# Patient Record
Sex: Female | Born: 1960
Health system: Southern US, Community
[De-identification: ages and names within clinical notes are randomized; demographics above are authoritative.]

## PROBLEM LIST (undated history)

## (undated) ENCOUNTER — Emergency Department (HOSPITAL_COMMUNITY): Admission: EM | Payer: 59 | Source: Home / Self Care

## (undated) DIAGNOSIS — M810 Age-related osteoporosis without current pathological fracture: Secondary | ICD-10-CM

## (undated) DIAGNOSIS — T7840XA Allergy, unspecified, initial encounter: Secondary | ICD-10-CM

## (undated) DIAGNOSIS — J45909 Unspecified asthma, uncomplicated: Secondary | ICD-10-CM

## (undated) HISTORY — DX: Age-related osteoporosis without current pathological fracture: M81.0

## (undated) HISTORY — DX: Allergy, unspecified, initial encounter: T78.40XA

## (undated) HISTORY — DX: Unspecified asthma, uncomplicated: J45.909

## (undated) HISTORY — PX: ABDOMINAL HYSTERECTOMY: SHX81

## (undated) HISTORY — PX: KNEE SURGERY: SHX244

## (undated) HISTORY — PX: APPENDECTOMY: SHX54

---

## 1999-02-01 ENCOUNTER — Encounter: Admission: RE | Admit: 1999-02-01 | Discharge: 1999-05-01 | Payer: Self-pay | Admitting: Anesthesiology

## 1999-02-06 ENCOUNTER — Encounter: Admission: RE | Admit: 1999-02-06 | Discharge: 1999-05-07 | Payer: Self-pay | Admitting: Orthopedic Surgery

## 1999-05-01 ENCOUNTER — Encounter: Payer: Self-pay | Admitting: Anesthesiology

## 1999-05-01 ENCOUNTER — Encounter: Admission: RE | Admit: 1999-05-01 | Discharge: 1999-07-30 | Payer: Self-pay | Admitting: Anesthesiology

## 1999-06-20 ENCOUNTER — Encounter: Payer: Self-pay | Admitting: Anesthesiology

## 1999-07-05 ENCOUNTER — Encounter: Payer: Self-pay | Admitting: Anesthesiology

## 1999-08-02 ENCOUNTER — Encounter: Admission: RE | Admit: 1999-08-02 | Discharge: 1999-10-31 | Payer: Self-pay | Admitting: Anesthesiology

## 1999-08-15 ENCOUNTER — Encounter: Admission: RE | Admit: 1999-08-15 | Discharge: 1999-10-21 | Payer: Self-pay | Admitting: Psychiatry

## 1999-10-09 ENCOUNTER — Encounter: Admission: RE | Admit: 1999-10-09 | Discharge: 2000-01-07 | Payer: Self-pay | Admitting: Pulmonary Disease

## 1999-11-21 ENCOUNTER — Encounter: Admission: RE | Admit: 1999-11-21 | Discharge: 1999-12-23 | Payer: Self-pay | Admitting: Anesthesiology

## 2000-01-14 ENCOUNTER — Encounter: Admission: RE | Admit: 2000-01-14 | Discharge: 2000-04-13 | Payer: Self-pay | Admitting: Anesthesiology

## 2000-01-20 ENCOUNTER — Encounter: Admission: RE | Admit: 2000-01-20 | Discharge: 2000-03-11 | Payer: Self-pay | Admitting: Anesthesiology

## 2000-04-28 ENCOUNTER — Encounter: Admission: RE | Admit: 2000-04-28 | Discharge: 2000-07-27 | Payer: Self-pay | Admitting: Anesthesiology

## 2000-05-11 ENCOUNTER — Encounter: Admission: RE | Admit: 2000-05-11 | Discharge: 2000-08-09 | Payer: Self-pay | Admitting: Pulmonary Disease

## 2000-08-27 ENCOUNTER — Encounter: Admission: RE | Admit: 2000-08-27 | Discharge: 2000-11-25 | Payer: Self-pay | Admitting: Anesthesiology

## 2000-11-24 ENCOUNTER — Other Ambulatory Visit: Admission: RE | Admit: 2000-11-24 | Discharge: 2000-11-24 | Payer: Self-pay | Admitting: Obstetrics and Gynecology

## 2001-01-07 ENCOUNTER — Encounter: Admission: RE | Admit: 2001-01-07 | Discharge: 2001-02-06 | Payer: Self-pay | Admitting: Anesthesiology

## 2001-11-29 ENCOUNTER — Other Ambulatory Visit: Admission: RE | Admit: 2001-11-29 | Discharge: 2001-11-29 | Payer: Self-pay | Admitting: Obstetrics and Gynecology

## 2002-12-06 ENCOUNTER — Encounter: Payer: Self-pay | Admitting: Obstetrics and Gynecology

## 2002-12-06 ENCOUNTER — Ambulatory Visit (HOSPITAL_COMMUNITY): Admission: RE | Admit: 2002-12-06 | Discharge: 2002-12-06 | Payer: Self-pay | Admitting: Obstetrics and Gynecology

## 2002-12-28 ENCOUNTER — Encounter: Admission: RE | Admit: 2002-12-28 | Discharge: 2002-12-28 | Payer: Self-pay | Admitting: Internal Medicine

## 2002-12-28 ENCOUNTER — Encounter: Payer: Self-pay | Admitting: Internal Medicine

## 2003-05-16 ENCOUNTER — Other Ambulatory Visit: Admission: RE | Admit: 2003-05-16 | Discharge: 2003-05-16 | Payer: Self-pay | Admitting: Obstetrics and Gynecology

## 2004-05-10 ENCOUNTER — Encounter: Admission: RE | Admit: 2004-05-10 | Discharge: 2004-05-10 | Payer: Self-pay | Admitting: Obstetrics and Gynecology

## 2005-06-10 ENCOUNTER — Encounter: Admission: RE | Admit: 2005-06-10 | Discharge: 2005-06-10 | Payer: Self-pay | Admitting: Obstetrics and Gynecology

## 2006-07-03 ENCOUNTER — Ambulatory Visit (HOSPITAL_COMMUNITY): Admission: RE | Admit: 2006-07-03 | Discharge: 2006-07-03 | Payer: Self-pay | Admitting: Internal Medicine

## 2006-07-22 ENCOUNTER — Encounter: Admission: RE | Admit: 2006-07-22 | Discharge: 2006-07-22 | Payer: Self-pay | Admitting: Internal Medicine

## 2007-07-23 ENCOUNTER — Encounter: Admission: RE | Admit: 2007-07-23 | Discharge: 2007-07-23 | Payer: Self-pay | Admitting: Obstetrics and Gynecology

## 2008-01-06 ENCOUNTER — Emergency Department (HOSPITAL_COMMUNITY): Admission: EM | Admit: 2008-01-06 | Discharge: 2008-01-06 | Payer: Self-pay | Admitting: Family Medicine

## 2009-02-09 ENCOUNTER — Encounter: Admission: RE | Admit: 2009-02-09 | Discharge: 2009-02-09 | Payer: Self-pay | Admitting: Obstetrics and Gynecology

## 2009-08-08 ENCOUNTER — Ambulatory Visit: Payer: Self-pay | Admitting: Sports Medicine

## 2009-08-08 DIAGNOSIS — M25569 Pain in unspecified knee: Secondary | ICD-10-CM | POA: Insufficient documentation

## 2009-08-08 DIAGNOSIS — M7650 Patellar tendinitis, unspecified knee: Secondary | ICD-10-CM | POA: Insufficient documentation

## 2009-08-29 ENCOUNTER — Ambulatory Visit: Payer: Self-pay | Admitting: Sports Medicine

## 2009-09-26 ENCOUNTER — Ambulatory Visit: Payer: Self-pay | Admitting: Sports Medicine

## 2009-09-26 ENCOUNTER — Encounter: Payer: Self-pay | Admitting: Family Medicine

## 2009-09-26 DIAGNOSIS — R269 Unspecified abnormalities of gait and mobility: Secondary | ICD-10-CM | POA: Insufficient documentation

## 2009-09-26 DIAGNOSIS — M216X9 Other acquired deformities of unspecified foot: Secondary | ICD-10-CM | POA: Insufficient documentation

## 2009-09-26 DIAGNOSIS — M549 Dorsalgia, unspecified: Secondary | ICD-10-CM | POA: Insufficient documentation

## 2009-10-10 ENCOUNTER — Encounter: Admission: RE | Admit: 2009-10-10 | Discharge: 2009-11-19 | Payer: Self-pay | Admitting: Family Medicine

## 2009-10-17 ENCOUNTER — Encounter: Payer: Self-pay | Admitting: Family Medicine

## 2009-11-15 ENCOUNTER — Encounter: Payer: Self-pay | Admitting: Family Medicine

## 2010-01-25 ENCOUNTER — Ambulatory Visit (HOSPITAL_COMMUNITY): Admission: RE | Admit: 2010-01-25 | Discharge: 2010-01-25 | Payer: Self-pay | Admitting: Internal Medicine

## 2010-02-19 ENCOUNTER — Encounter: Admission: RE | Admit: 2010-02-19 | Discharge: 2010-02-19 | Payer: Self-pay | Admitting: Obstetrics and Gynecology

## 2010-06-30 ENCOUNTER — Encounter: Payer: Self-pay | Admitting: Obstetrics and Gynecology

## 2010-06-30 ENCOUNTER — Encounter: Payer: Self-pay | Admitting: Internal Medicine

## 2010-07-09 NOTE — Assessment & Plan Note (Signed)
Summary: f/u,mc   Vital Signs:  Patient profile:   50 year old female BP sitting:   110 / 70  (right arm)  Vitals Entered By: Terese Door, CMA (August 29, 2009 1:49 PM) CC: F/U right knee pain   CC:  F/U right knee pain.  History of Present Illness: Reports to f/u right patella tendonitis. Initially avoided aerobic classes and yoga as instructed. Resumed aerobic class last week; noted pain exacerbation for 1 day only. Performing daily hip and quad exercises as instructed. Using body helix PT strap during exercises. Pain decreased by 30-40% since her LOV.  No worsening of chronic back pain. Will see acupuncturist for back pain.   Allergies: 1)  ! Sulfa 2)  ! Pcn  Past History:  Past Medical History: History of chronic back pain with nerve injury (ocassionally symptomatic wrt to the RLE) secondary to fall. PMH-FH-SH reviewed for relevance  Physical Exam  General:  Well-developed,well-nourished,in no acute distress; alert,appropriate and cooperative throughout examination Msk:  Unchanged from last office visit except for notably decreased peri-patella tendon ttp on the right and grossly increased right abd/er strength and bilateral quad strength.   Impression & Recommendations:  Problem # 1:  TENDINITIS, PATELLAR (ICD-726.64)  - Continue current regimen. - Voltaren gel. - RTC in 4-6 wks or sooner as needed for interim re-assessment.  Complete Medication List: 1)  Voltaren 1 % Gel (Diclofenac sodium) .... Rub 1 gram into the affected area qid Prescriptions: VOLTAREN 1 % GEL (DICLOFENAC SODIUM) Rub 1 gram into the affected area QID  #100 gm x 1   Entered and Authorized by:   Valarie Merino MD   Signed by:   Valarie Merino MD on 08/29/2009   Method used:   Print then Give to Patient   RxID:   [id number] VOLTAREN 1 % GEL (DICLOFENAC SODIUM) Rub 1 gram into the affected area QID  #100 gm x 1   Entered and Authorized by:   Valarie Merino MD   Signed by:   Valarie Merino MD on 08/29/2009   Method used:   Print then Give to Patient   RxID:   [id number]

## 2010-07-09 NOTE — Miscellaneous (Signed)
[facility]   Imported By: Marily Memos 10/17/2009 14:15:48  _____________________________________________________________________  External Attachment:    Type:   Image     Comment:   External Document

## 2010-07-09 NOTE — Letter (Signed)
[facility]   Imported By: Marily Memos 10/01/2009 11:17:25  _____________________________________________________________________  External Attachment:    Type:   Image     Comment:   External Document

## 2010-07-09 NOTE — Miscellaneous (Signed)
[facility]   Imported By: Marily Memos 11/21/2009 11:06:23  _____________________________________________________________________  External Attachment:    Type:   Image     Comment:   External Document

## 2010-07-09 NOTE — Assessment & Plan Note (Signed)
Summary: f/u,mc   History of Present Illness: Reports to f/u right patella tendonitis. Condition has improved by 80-90%. Using PT strap and performing exercises as instructed. No running over past few months. No elliptical recently. Has has some knee pain with elliptical in the past; prior to first visit here. Would like to eventually resume running a few times weekly.  Allergies: 1)  ! Sulfa 2)  ! 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 Last EMG and MRI > 10 yrs ago.  Last Bone Density scan 2 yrs ago PMH-FH-SH reviewed for relevance  Family History: Non-contributory  Social History: Employed by [employer]. Work activities do not significantly stress the knees.  Physical Exam  General:  Well-developed,well-nourished,in no acute distress; alert,appropriate and cooperative throughout examination Msk:  Unchanged except for no patella tendon ttp increased right core and B quad strength.  ANKLES/FEET/GAIT: Bilateral pes planus with excessive pronation on standing. Relative ER of the right foot. Findings more pronounced on ambulation with dynamic pronation; controlled on insertion of comforthotics. 4/5 sterngth on plantarflexion bilaterally (with relative left weakness).   Pulses:  2+ pt/dp pulses. Extremities:  Equal leg lengths.   Impression & Recommendations:  Problem # 1:  TENDINITIS, PATELLAR (ICD-726.64) Assessment Improved  - Continue current plan. - Will start formal physical therapy for eccentric exercises given medial surgical history and activity goals. - Can try walking on track for 1 mile twice weekly, with 2-3 days between runs. If tolerated runs w/o pain, then can start running 1 out of every 4 laps during week two. Can accordingly increase running proportion by 1 lap each week as tolerated, until  able to jog 4 laps. No running on treadmill or conrete. - RTC in 6 weeks.  Problem # 2:  ABNORMALITY OF GAIT (ICD-781.2)  - Comforthotics. - Formal PT for gait training.  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  Problem # 4:  BACK PAIN, CHRONIC (ICD-724.5) Currently stable  - Will start formal PT.  Problem # 5:  Preventive Health Care (ICD-V70.0)  - Contact PCP re: bone density testing ASAP.  Complete Medication List: 1)  Voltaren 1 % Gel (Diclofenac sodium) .... Rub 1 gram into the affected area qid  Appended Document: f/u,mc Msk:  Unchanged except for no patella tendon ttp, increased right core strength and B quad strength.

## 2010-07-09 NOTE — Assessment & Plan Note (Signed)
Summary: NP,KNEE PAIN,[employer]EE,MC   Vital Signs:  Patient profile:   50 year old female Height:      62 inches Weight:      134 pounds BMI:     24.60 BP sitting:   133 / 87  Vitals Entered By: Lillia Pauls CMA (August 08, 2009 2:45 PM)  CC:  right knee pain.  History of Present Illness: Pt c/o right knee pain for about 1 week.  She says during aerobics class she noticed some soreness of the knee right below the knee cap.  She noticed that it was worsened by deep squate sthey had to do during class.  Didn't feel a locking or catching.  Didn't twist it or fall.  She hasn't done aerobics since, and it's been sore since then, she's tried some ice and rest but it's still sore.  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  Allergies (verified): 1)  ! Sulfa 2)  ! Pcn  Past History:  Past Surgical History: 20 years ago-  ACL, patella, muscular realignment -m genetic condition PMH-FH-SH reviewed for relevance  Physical Exam  General:  Well-developed,well-nourished,in no acute distress; alert,appropriate and cooperative throughout examination Msk:  HIPS/PELVIS: Symmetric. FROM. Full strength except for weak right abd/er (4/5).  KNEES: Healed longitudinal surgical incision on right. Slight right peri- patella tendon ttp/swelling. Decreased VMO definition bilaterally. 'J' tracking bilaterally. Full ROM. Full strength with mild relative decrease in extension strength bilaterally.  ANKLES/FEET: Full ROM/strength.  * Equal leg lengths. Neurologic:  Normal nv examination.   Impression & Recommendations:  Problem # 1:  TENDINITIS, PATELLAR (ICD-726.64)  - no aerobic class or yoga for 1 wk. transition toward regular activity as tolerated. - Body helix patella tendon strap for the right knee only during exercise activities. - Daily hip rehab  exercises and quad sets, demonstrated to the patient, as tolerated. - no nsaid given hx of respective GI intolerance. - ice for 20 mins/day. - rtc in 3 wks or sooner as needed for any concerns.  Other Orders: Garment,belt,sleeve or other covering ,elastic or similar stretch (F0932)  Appended Document: NP,KNEE PAIN,[employer] EMPLOYEE,MC

## 2010-08-10 ENCOUNTER — Encounter: Payer: Self-pay | Admitting: *Deleted

## 2010-10-25 NOTE — Op Note (Signed)
[facility]  Patient:    Melanie [name], Melanie [name]                       MRN: [id number] Proc. Date: 01/08/01 Adm. Date:  [id number] Attending:  Thyra Breed CC:         Mickie Kay, M.D.   Operative Report  ATTENDING:  Thyra Breed, M.D.  PROCEDURE:  Trigger point injection of the pyriformis muscle.  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  INTERVAL HISTORY:  The patient did fairly well after her last injection up until about three or four days ago when she noticed the recurrence of her pain to a significant extent.  She describes it in the distribution of the sciatic nerve predominantly.  PHYSICAL EXAMINATION:  VITAL SIGNS:  Blood pressure is 121/68, heart rate is 87, respirations 18, and O2 sat is 100%.  GENERAL:  Pain level is 5 out of 10.  EXTREMITIES:  Straight leg raise signs are negative.  Internal rotation of her left leg does not significantly exacerbate her discomfort.  NEUROLOGICAL:  Otherwise, unchanged.  PROCEDURE:  After an informed consent was obtained, the patient was placed in the right lateral decubitus position with her right leg extended and her left flexed at the hip and knee.  A line was drawn from the greater trochanter to the posterosuperior iliac spine.  At the midpoint of the spine, a perpendicular line was drawn down to intersect the line from the greater trochanter to the sacroiliitis.  Pressure over this area elicited discomfort. The area was marked and prepped with Betadine x3.  The 27-gauge needle was used to anesthetize the skin and subcutaneous structures with 1% lidocaine.  A 25-gauge needle was introduced down to approximate the pyriformis muscle.  I injected 4 cc of local anesthetic mixture.  The needle was removed intact. The patient was re-evaluated in about five minutes and noted that she had incomplete decrease in her pain.  The area was  reprepped out, and an additional 2 cc of local anesthetic was injected.  The local anesthetic consisted of 4 cc of 1% Lidocaine mixed with 0.5 ______ 4 cc with 40 mg of Medrol in the total volume.  The needle was removed intact.  POSTPROCEDURE CONDITION:  Stable.  DISCHARGE INSTRUCTIONS: 1. Resume previous diet. 2. Limitation of activities per instruction sheet. 3. Continue on current medications. 4. Followup with me in six weeks to consider repeat injection. DD:  01/08/01 TD:  01/11/01 Job: [id number]

## 2010-10-25 NOTE — Procedures (Signed)
[facility]  Patient:    Melanie [name], Melanie [name]                       MRN: [id number] Proc. Date: 02/17/00 Adm. Date:  [id number] Attending:  Thyra Breed CC:         Mickie Kay, M.D.                           Procedure Report  PROCEDURE:  ANESTHESIOLOGIST:  Thyra Breed, M.D.  INDICATIONS:  [name] comes in for follow-up evaluation of buttock discomfort. She has continued to note that she has a lot of discomfort if she sits for any prolonged period of time.  She states that her left side is worse than her right but in assessing Luberta Robertson evaluation in physical therapy, she has a lot of tightness on the right side.  Kim apparently has told the patient she might benefit from an SI joint injection.  PHYSICAL EXAMINATION:  VITAL SIGNS:  Her exam today reveals blood pressure of 106/70, heart rate 66, respiratory rate 14, O2 saturation 96%, pain level 8/10, temperature 98.4.  MUSCULOSKELETAL:  She exhibits tenderness over the left SI joint.  Straight leg raise signs are negative.  Her piriformis area is minimally tender.  Deep tendon reflexes are unchanged from previously.  DESCRIPTION OF PROCEDURE:  After informed consent was obtained, the patient was taken to the fluoroscopy suite and placed in the prone position with a pillow under her abdomen.  Using fluoroscopic guidance, I identified the left SI joint.  I optimized visualization of this by adjusting the beam.  A mark was made on the skin and the skin prepped with Betadine x 3.  A skin wheal was raised with a 25 gauge needle using 2 cc of 1% lidocaine.  A 25 gauge spinal needle was introduced down into the SI joint on the left side confirmed by AP, lateral and oblique projections.  Aspiration was negative.  I injected 0.5 cc of 1% lidocaine followed by 20 ml of 1% lidocaine with 40 mg of Medrol.  The needle was flushed with 1% lidocaine and removed intact.  POSTPROCEDURE CONDITION:  Fifteen  minutes later, the patient had much less discomfort in sitting, especially over the left side.  Vital signs were stable.  DISPOSITION: 1. Resume previous diet. 2. Limitation of activities per instruction sheet. 3. Continue with physical therapy as previously with prescription    written for trial of TENS unit if Luberta Robertson in physical therapy agrees. 4. Follow up with me in four weeks. DD:  02/17/00 TD:  02/19/00 Job: [id number]

## 2010-10-25 NOTE — H&P (Signed)
[facility]  Patient:    Melanie [name], Melanie [name]                       MRN: [id number] Adm. Date:  [id number] Attending:  Thyra Breed CC:         Mickie Kay, M.D.   History and Physical  FOLLOW-UP EVALUATION:  Melanie [name] comes in for a follow-up evaluation of her coccyx discomfort and to a lesser extent her left piriformis-type discomfort. Since her previous evaluation, she was doing well up until about 2 weeks ago, and she steadily noted increasing symptoms. She notes that the ______ andstretching help. Prolonged sitting exacerbates this. She feels like her current level of pain at 8/10 is a reflection of the weather changes. She has been down around 3-4/10.  PHYSICAL EXAMINATION:  VITAL SIGNS:  Blood pressure 110/57, heart rate is 96, respiratory rate is 16, O2 saturation is 99%, pain level is 8/10.  NEUROLOGICAL:  She has some mild increased pain on internal rotation of the left hip. Deep tendon reflexes were symmetric. Straight leg raise signs are negative.  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  DISPOSITION: 1. Continue with current home physical therapy, stretching exercises, and    consider reevaluation of physical therapy if she does not improve. I have    advocated that she might try apple cider vinegar with honey to see if this is    helps with her discomfort. She seems interested in this. 2. I plan to see her back in about 6 weeks. DD:  03/20/00 TD:  03/20/00 Job: [id number]

## 2010-10-25 NOTE — Procedures (Signed)
[facility]  Patient:    Melanie [name], BAUMGARNER                       MRN: [id number] Proc. Date: 11/22/99 Adm. Date:  [id number] Attending:  Thyra Breed CC:         Mickie Kay, M.D.                           Procedure Report  PROCEDURE:  Left sacroiliac joint injection.  DIAGNOSIS:  Sacroiliac joint pain and mild S1 radiculopathy.  INTERVAL HISTORY:  The patient has finished her physical therapy and has had a 40% reduction in pain.  She has lost 10 pounds, and she is feeling better overall.  PHYSICAL EXAMINATION:  VITAL SIGNS:  Blood pressure is 123/69, heart rate 74, respiratory rate 15, O2 saturation 100%.  Pain level is 9 out of 10.  MUSCULOSKELETAL/NEURLOGIC:  She exhibited tenderness over her left SI joint on exam today, with negative straight leg raise signs.  Her pain is increased by internal rotation of the left leg.  Deep tendon reflexes were symmetric.  DESCRIPTION OF PROCEDURE:  After her exam, I advised the patient I would like to go ahead and try her with an SI joint injection rather than proceeding with the sacrococcygeal plexus block.  The patient consented, and an IV was established in her left upper extremity. Monitors were placed.  She was placed in the prone position.  I identified the left SI joint and optimized visualization.  A mark was made over the skin. The skin was prepped with Betadine x 3.  I draped out the area.  A skin wheal was raised with a 25-gauge needle using 1% lidocaine.  A 25-gauge spinal needle was introduced into the SI joint, which was confirmed by AP, lateral, and oblique projections.  Aspiration was negative.  I injected 0.5 cc of lidocaine, which the patient noted reduced her discomfort markedly.  Medrol 40 mg in 2 cc of 1% lidocaine was obtained.  The needle was flushed with 1% lidocaine and removed intact.  POSTPROCEDURE CONDITION:  Stable.  DISCHARGE INSTRUCTIONS: 1. Resume previous  diet. 2. Limitation on activities per instruction sheet. 3. Continue on current medications. 4. Follow up with me in 10 weeks for repeat injection. DD:  11/22/99 TD:  11/27/99 Job: [id number]

## 2010-10-25 NOTE — H&P (Signed)
[facility]  Patient:    Melanie [name], Melanie [name]                       MRN: [id number] Adm. Date:  [id number] Attending:  Thyra Breed CC:         Mickie Kay, M.D.   History and Physical  FOLLOWUP EVALUATION:  [name] comes in for followup evaluation of her coccyodynia and piriformis-type syndrome.  Since her recent evaluation, the patient has continued to have discomfort but notes that the exercises have been helpful.  Unfortunately, it is around her period and at that time, she has more discomfort and she rates it at 8/10.  She is actually asking if we could proceed with a block today, but I advised her that I really wanted to find a long-term treatment for this rather than temporary treatments.  We discussed the fact that she has been on a TENS unit before and had a pretty good response and that this might be the way to do.  She was intolerant of the honey/apple cider vinegar combination with exacerbations of her GI discomfort. She feels as though she has had ongoing numbness of her right leg, especially the lateral aspect of her right calf since her knee surgery.  EXAMINATION  VITAL SIGNS:  Blood pressure 112/72, heart rate is 82, respiratory rate is 12 and O2 saturation is 93%.  EXTREMITIES:  Straight leg raise signs are negative.  Deep tendon reflexes are symmetric.  She has some attenuated perception over the lateral aspect of her right calf.  IMPRESSION 1. Coccyodynia with piriformis syndrome. 2. Right calf discomfort and decreased sensation, most likely peroneal nerve    irritation from probable surgical intervention in the past. 3. Other medical problems per primary care physician.  DISPOSITION 1. I discussed Ultram with the patient; she has apparently tolerated this    before with some increased sleepiness but with no hives or untoward    reaction.  I advised her that I would try her on some Ultram 50 mg 1 to 2    p.o. q.6h. p.r.n.,  #100 with no refill. 2. We will see if we can get her a TENS unit.  Apparently, her insurance    company would not approve it, even though she had a very good response    before. 3. Follow up with me in six to eight weeks. DD:  04/28/00 TD:  04/29/00 Job: [id number]

## 2010-10-25 NOTE — Procedures (Signed)
[facility]  Patient:    Melanie [name], Melanie [name]                       MRN: [id number] Proc. Date: 10/09/00 Adm. Date:  [id number] Attending:  Thyra Breed CC:         Mickie Kay, M.D.   Procedure Report  PROCEDURE:  Piriformis trigger point injection of the left hip.  DIAGNOSIS:  Piriformis syndrome and coccygodynia.  INTERVAL HISTORY:  The patient noted that the last injection was quite helpful up until just a few days ago.  She notes that her symptoms seem to be significantly exacerbated by her periods.  She rates her pain currently at 8/10.  She notes that it is mostly localized to the buttock and down the posterior aspect of her leg and back towards the coccygeal region.  PHYSICAL EXAMINATION: Blood pressure 122/68.  Heart rate is 82.  Respiratory rate 18.  O2 saturations 100%.  Pain level is 8/10.  The patient demonstrates tenderness over the left piriformis.  Straight leg raise signs are negative.  DESCRIPTION OF PROCEDURE:  After informed consent was obtained, the patient was placed in the right lateral decubitus position with the right leg extended and the left flexed at the hip and knee.  A line was drawn from the greater trochanter to the posterior superior iliac spine.  A second line was drawn from the greater trochanter to the sacral hiatus.  A line perpendicular to the mid point of the first line was drawn to intersect the second line.  At that point, pressure was applied which elicits her discomfort.  The area was marked and prepped with Betadine x 3.  I raised a skin wheal with a 27 gauge needle using 1% lidocaine.  A 25 gauge spinal needle was introduced down to the piriformis muscle with paresthesias elicited along the sciatic nerve distribution.  The needle was retracted approximately 3 mm.  Aspiration was negative for blood.  I injected 5 mL of anesthetic and Medrol.  The total mixture of Medrol and anesthetic was 4 cc of 1%  lidocaine mixed with 4 cc of 0.5% levobupivacaine and 0.5 cc of Medrol.  The needle was removed intact. The patient noted incomplete block after 10 minutes, so I reprepped out the area and reinjected with additional 2 cc of the same mixture.  Condition postprocedure - stable.  DISCHARGE INSTRUCTIONS: 1. Resume previous diet. 2. Limitations of activities per instruction sheet, as outlined by my    assistant today. 3. Continue with exercises as previously. 4. Follow up in six weeks, at which time we will consider a repeat injection. DD:  10/09/00 TD:  10/09/00 Job: [id number]

## 2010-10-25 NOTE — Procedures (Signed)
[facility]  Patient:    Melanie [name], Melanie [name]                       MRN: [id number] Proc. Date: 07/24/00 Adm. Date:  [id number] Attending:  Thyra Breed CC:         [facility]   Procedure Report  PROCEDURE:  Piriformis injection of the left piriformis muscle.  DIAGNOSIS:  Piriformis syndrome and coccydynia.  INTERVAL HISTORY:  The patient noted fairly good improvement after her last injection, although with her periods, her symptoms seem to be more amplified. She presents for a repeat injection today.  She tolerated the corticosteroids well.  PHYSICAL EXAMINATION: Blood pressure 103/56.  Heart rate is 83.  Respiratory rate 20.  O2 saturations 100%.  Pain level is 8/10.  She exhibited tenderness over the left piriformis muscle.  DESCRIPTION OF PROCEDURE:  After informed consent was obtained, the patient was placed in the right lateral decubitus position with the right leg extended and left flexed at the hip and knee.  I identified the the greater trochanter on the left hip and the posterior superior iliac spine.  A line drawn from these two points was bisected at the midpoint with a perpendicular line to intersect a line from the greater trochanter to the sacral hiatus.  A mark was made on the skin, identifying this point, and pressure applied which elicited her symptoms.  The area was prepped with Betadine x 3 and anesthetized with a 27 gauge needle using 1% lidocaine.  A 25 gauge spinal needle was introduced down to the piriformis muscle with a paraesthesia elicited down in the distribution of the sciatic nerve.  The needle was retracted approximately 3-4 mm.  Aspiration was negative for blood.  I injected 4 cc of local anesthetic with 10 mg of Medrol. The local anesthetic consisted of 1% lidocaine with 0.25% Levobupivacaine in 1:1 ratio.  The needle was removed intact.  Fifteen minutes later, the patient noted decreased pain except towards  her coccyx where she had some ongoing discomfort.  DISPOSITION: 1. Resume previous diet. 2. Limitations of activities per instruction sheet, as outlined by my    assistant today. 3. Continue on current medication regimen. 4. Continue with Yoga exercises which she is currently doing. 5. Follow up with me in 4-5 weeks to consider repeat injection. DD:  07/24/00 TD:  07/25/00 Job: [id number]

## 2010-10-25 NOTE — Procedures (Signed)
[facility]  Patient:    Melanie [name], Melanie [name]                       MRN: [id number] Proc. Date: 06/26/00 Adm. Date:  [id number] Attending:  Thyra Breed CC:         Mickie Kay, M.D.   Procedure Report  PROCEDURE:  Trigger point injection of the piriformis muscle on the left side.  DIAGNOSES:  Piriformis syndrome and coccydynia.  INTERVAL HISTORY:  Melanie [name] has been doing well up until this week and she has had some flare-ups of pain in her left buttock radiating out into her left foot with some numbness and tingling. I discussed this with her and it sounds more like her piriformis syndrome is acting up more than anything else.  PHYSICAL EXAMINATION:  Blood pressure is 138/76, heart rate is 86, respiratory rate 16, O2 saturations 100%, and pain level is 10/10. The patient has positive numbness and tingling on internal rotation of the left hip. She has tenderness over the left piriformis muscle.  DESCRIPTION OF PROCEDURE:  After informed consent was obtained, the patient was placed in the right lateral decubitus position with the right leg extending left flexed at the hip and knee. I identified the greater trochanter and the posterior superior iliac spine and drew a line between these two points. At the midpoint of that line, a perpendicular line was drawn down to intersect the line from the greater trochanter to the sacrohiatus. A mark was made on the skin at this point and pressure applied which elicited her symptoms.  The area was prepped with Betadine x 3 and anesthetized with a 27 gauge needle using 1% lidocaine. A 25 gauge spinal needle was introduced down to the piriformis muscle. A paraesthesia was elicited down the foot in the distribution of the sciatic nerve and the needle pulled back a 1/2 cm. Aspiration was negative for blood and I injected 3 cc of local anesthetic with 10 mg of Medrol. The local anesthetic was 1% lidocaine to  0.5% levobupivacaine in a ratio of 1:1. The needle was removed intact.  CONDITION POST PROCEDURE:  Stable with no weakness of her left lower extremity with good pain relief.  DISPOSITION: 1. Resume previous diet. 2. Limitations in activities per instruction sheet. 3. Continue on current medical regimen. 4. Followup with me in 4 weeks to consider repeat injection of her piriformis    muscle. DD:  06/26/00 TD:  06/29/00 Job: [id number]

## 2010-10-25 NOTE — Procedures (Signed)
[facility]  Patient:    Melanie [name], Melanie [name]                       MRN: [id number] Proc. Date: 08/28/00 Adm. Date:  [id number] Attending:  Thyra Breed CC:         Mickie Kay, M.D.   Procedure Report  PREOPERATIVE DIAGNOSIS:  Piriformis syndrome and coccyodynia.  POSTOPERATIVE DIAGNOSIS:  Piriformis syndrome and coccyodynia.  PROCEDURE:  Piriformis injection of the left piriformis muscle.  SURGEON:  Thyra Breed, M.D.  INTERVAL HISTORY:  The patient noted some twinges of pain recurring recently, but overall, she has done remarkably well after her last injection.  PHYSICAL EXAMINATION:  Blood pressure is 111/64, heart rate 95, respiratory rate 20, and O2 saturations 99%.  Pain level is 7 out of 10.  Temperature is 98.1.  Deep tendon reflexes symmetrical in the lower extremities.  She exhibits tenderness over her left piriformis region.  DESCRIPTION OF PROCEDURE:  After informed consent was obtained, the patient was placed in the right lateral decubitus position with the right leg extended and the ______ flexed at the hip and knee.  Aligned from the greater trochanter to the posterior-superior iliac spine was drawn and at the mid plane of the _______, a perpendicular line to intersect the second line from the greater trochanter to the sacral hiatus was drawn.  At the intersection of the line, pressure was applied, and elicits her discomfort.  The area was marked and prepped with Betadine x 3.  I ________ the skin with a 27-gauge needle using 1% lidocaine.  A 25-gauge spinal needle was introduced down to the piriformis muscle with paresthesia elicited in the sciatic nerve distribution and retraction of the needle about 3-4 mm.  Aspiration was negative for blood.  I injected 5 cc of local anesthetic with 10 mg of Medrol. The local anesthetic consisted of 1% lidocaine with 0.5% levobupivacaine in a ratio of 1:1.  The needle was removed  intact.  POSTPROCEDURE CONDITION:  Stable.  DISCHARGE INSTRUCTIONS: 1. Resume previous diet. 2. Limitation and activities per instruction sheet as outlined by assistant    today. 3. Continue with exercises she has. 4. Followup with me in six weeks to consider repeat injection. DD:  08/28/00 TD:  08/30/00 Job: [id number]

## 2010-10-25 NOTE — Consult Note (Signed)
[facility]  Patient:    Melanie [name], WHAN                       MRN: [id number] Proc. Date: 01/14/00 Adm. Date:  [id number] Attending:  Thyra Breed CC:         Mickie Kay, M.D.                          Consultation Report  FOLLOWUP EVALUATION:  [name] comes in for followup evaluation of her piriformis syndrome and coccyodynia.  Since her last evaluation, she has noted that the piriformis syndrome seems to have been exacerbated over the weekend and it approximates the time of her period beginning.  She feels as though it is likely more related to this than anything else.  She has tried to take the [facility] for this but she seems to note increased GI intolerance to this.  She has been on Flexeril in the past, Neurontin as well as tricyclic antidepressants and not noted a great deal of improvement.  She continues to try and stretch but she is getting less help from the stretching.  She has not been able to work due to her discomfort.  CURRENT MEDICATIONS:  Advair, Singulair, Allegra and she was on Soma up until yesterday.  EXAMINATION  VITAL SIGNS:  Blood pressure 120/71, heart rate is 88, respiratory rate is 14, O2 saturation is 95%, pain level is 10/10 and temperature is 97.9.  MUSCULOSKELETAL:  She exhibits tenderness over the left piriformis muscle. She demonstrates intact deep tendon reflexes at the knees and ankles with breakthrough weakness of the lower extremities due to discomfort.  IMPRESSION:  Combination of piriformis syndrome with coccygodynia which seems to be exacerbated.  She has been through physical therapy and does home physical therapy at the present time.  DISPOSITION 1. Continue with physical therapy. 2. Patient was given a note to be out of work yesterday, today and tomorrow. 3. Trial of Baclofen 10 mg one-half tablet b.i.d. x 3 days and if tolerated,    up to three times a day. 4. If this is not helpful, then we will  need to consider either a trial of a    TENS unit of alpha micro-current stimulation. 5. She is scheduled to see me back in about four weeks. DD:  01/14/00 TD:  01/14/00 Job: [id number]

## 2010-10-25 NOTE — Procedures (Signed)
[facility]  Patient:    Melanie [name], Melanie [name]                       MRN: [id number] Adm. Date:  [id number] Attending:  Thyra Breed CC:         Mickie Kay, M.D.   Procedure Report  PROCEDURE:  Left piriformis injection.  DIAGNOSIS:  Piriformis syndrome.  ANESTHESIOLOGIST:  Thyra Breed, M.D.  HISTORY OF PRESENT ILLNESS:  The patient has noted that her last injection continued to do very well up until recently.  She is now having some pain that radiates out into her left foot with some mild numbness and tingling.  Overall she is better than she has been.  PHYSICAL EXAMINATION:  VITAL SIGNS:  The patient is afebrile with vital signs stable.  EXTREMITIES:  She exhibits tenderness over the left piriformis region. Internal rotation of her left leg results in pain down into the calf.  Deep tendon reflexes are symmetric.  DESCRIPTION OF PROCEDURE:  After informed consent was obtained, the patient was placed in the right lateral decubitus position with the right leg extended and the left flexed at the left hip and knee.  The line was drawn from the greater trochanter to the posterior superior iliac spine.  A second line was drawn from the greater trochanter to the sacral hiatus.  A perpendicular line from the first line to the second line where they intersect with application of pressure resulted in tenderness consistent with a trigger point.  The area was marked and prepped with Betadine x 3.  I raised the skin wheal with 27-gauge needle using 1% lidocaine.  A 25-gauge needle was introduced to paresthesias down her leg and I injected 3 cc of local anesthetic with Medrol. After 15 minutes there was still some discomfort, so I injected an additional 1.5 cc.  The local anesthetic consisted of 1% lidocaine mixed with 0.5% levobupivacaine and 1:1 ratio with a total volume of 8 ml of local anesthetic mixed with 1/2 cc of 40 mg/ml Medrol.  The needle  was was removed intact.  POST-PROCEDURE CONDITION:  Stable.  DISCHARGE INSTRUCTIONS: 1. Resume previous diet. 2. Limitations in activities per instruction sheet.  Her husband is her driver    home today. 3. Continue on current medications. 4. Followup with me in six weeks for repeat trigger point injection if needed. DD:  11/25/00 TD:  11/25/00 Job: [id number]

## 2010-12-15 ENCOUNTER — Inpatient Hospital Stay (INDEPENDENT_AMBULATORY_CARE_PROVIDER_SITE_OTHER)
Admission: RE | Admit: 2010-12-15 | Discharge: 2010-12-15 | Disposition: A | Discharge status: Home / Self Care | Payer: 59 | Source: Ambulatory Visit | Attending: Emergency Medicine | Admitting: Emergency Medicine

## 2010-12-15 DIAGNOSIS — J45909 Unspecified asthma, uncomplicated: Secondary | ICD-10-CM

## 2011-03-12 ENCOUNTER — Other Ambulatory Visit: Payer: Self-pay | Admitting: Obstetrics and Gynecology

## 2011-03-12 DIAGNOSIS — Z1231 Encounter for screening mammogram for malignant neoplasm of breast: Secondary | ICD-10-CM

## 2011-03-14 ENCOUNTER — Ambulatory Visit
Admission: RE | Admit: 2011-03-14 | Discharge: 2011-03-14 | Disposition: A | Discharge status: Home / Self Care | Payer: 59 | Source: Ambulatory Visit | Attending: Obstetrics and Gynecology | Admitting: Obstetrics and Gynecology

## 2011-03-14 DIAGNOSIS — Z1231 Encounter for screening mammogram for malignant neoplasm of breast: Secondary | ICD-10-CM

## 2011-06-03 ENCOUNTER — Ambulatory Visit (INDEPENDENT_AMBULATORY_CARE_PROVIDER_SITE_OTHER): Payer: 59

## 2011-06-03 DIAGNOSIS — R05 Cough: Secondary | ICD-10-CM

## 2011-06-03 DIAGNOSIS — J40 Bronchitis, not specified as acute or chronic: Secondary | ICD-10-CM

## 2011-06-03 DIAGNOSIS — R059 Cough, unspecified: Secondary | ICD-10-CM

## 2011-06-03 DIAGNOSIS — J019 Acute sinusitis, unspecified: Secondary | ICD-10-CM

## 2011-12-01 ENCOUNTER — Ambulatory Visit: Payer: 59 | Admitting: Family Medicine

## 2011-12-01 VITALS — BP 116/71 | HR 71 | Temp 98.1°F | Resp 18 | Ht 62.5 in | Wt 142.0 lb

## 2011-12-01 DIAGNOSIS — J45901 Unspecified asthma with (acute) exacerbation: Secondary | ICD-10-CM

## 2011-12-01 DIAGNOSIS — R059 Cough, unspecified: Secondary | ICD-10-CM

## 2011-12-01 DIAGNOSIS — R05 Cough: Secondary | ICD-10-CM

## 2011-12-01 DIAGNOSIS — J45909 Unspecified asthma, uncomplicated: Secondary | ICD-10-CM

## 2011-12-01 MED ORDER — ALBUTEROL SULFATE (2.5 MG/3ML) 0.083% IN NEBU
2.5000 mg | INHALATION_SOLUTION | RESPIRATORY_TRACT | Status: AC
  Administered 2011-12-01: 2.5 mg via RESPIRATORY_TRACT

## 2011-12-01 MED ORDER — IPRATROPIUM BROMIDE HFA 17 MCG/ACT IN AERS: 2.0000 | INHALATION_SPRAY | Freq: Four times a day (QID) | RESPIRATORY_TRACT | Status: DC

## 2011-12-01 MED ORDER — ALBUTEROL SULFATE (2.5 MG/3ML) 0.083% IN NEBU: 2.5000 mg | INHALATION_SOLUTION | Freq: Four times a day (QID) | RESPIRATORY_TRACT | Status: DC | PRN

## 2011-12-01 MED ORDER — IPRATROPIUM BROMIDE 0.02 % IN SOLN
0.5000 mg | RESPIRATORY_TRACT | Status: AC
  Administered 2011-12-01: 0.5 mg via RESPIRATORY_TRACT

## 2011-12-01 MED ORDER — HYDROCODONE-HOMATROPINE 5-1.5 MG/5ML PO SYRP: 5.0000 mL | ORAL_SOLUTION | Freq: Three times a day (TID) | ORAL | Status: AC | PRN

## 2011-12-01 NOTE — Progress Notes (Signed)
Subjective: 51 year old lady with a history of asthma. She has infrequent attacks, maybe once a year. Her last was back at the end of last year with a viral infection. She does not smoke. She flareup late last week, about Friday. She started using her to layer a inhaler again at that time. She is having a lot of cough and shortness of breath. She thinks may have a bronchitis with at this time. She can sit up to breathe. 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 No GI complaints  Objective: Healthy-appearing lady who has a pretty persistent cough during exam. HEENT: The are normal. Her throat is clear, maybe mildly erythematous from the coughing. Neck was supple without significant nodes. Chest is clear to auscultation despite the coughing. She does have a high expiration. Peak flow was 400 with a predicted of 410. Heart was regular without murmurs.  Assessment: Asthmatic bronchitis History of asthma  Plan: Nebulizer treatment with albuterol and Atrovent. Then I will decide treatment and

## 2011-12-01 NOTE — Patient Instructions (Signed)
Drink lots of fluids Continue using the Dulera Used the albuterol and Atrovent 4 times daily 2 puffs of each. When the cough is calmed down you can come off the Atrovent, then wean off of the albuterol. Take cough syrup as needed.  Return if worse

## 2011-12-07 ENCOUNTER — Telehealth: Payer: Self-pay

## 2011-12-07 MED ORDER — ALBUTEROL SULFATE HFA 108 (90 BASE) MCG/ACT IN AERS: 2.0000 | INHALATION_SPRAY | RESPIRATORY_TRACT | Status: DC | PRN

## 2011-12-07 NOTE — Telephone Encounter (Signed)
Per Dr. Frederik Pear note, both should have been inhalers, not nebs. But they were also prescribed almost a week ago...  OK to change to albuterol inh 2 puffs Q 4 hours prn cough, SOB, wheezing, #1, no RF

## 2011-12-07 NOTE — Telephone Encounter (Signed)
Patient was given rx for Albuterol nebules and Atrovent nasal spray... Now needing nebulizer rx'd for the nebules... Should patient have nebulizer?  Were we supposed to rx Albuterol inhaler?  Pls advise.

## 2011-12-07 NOTE — Telephone Encounter (Signed)
LMOM to CB. 

## 2011-12-07 NOTE — Telephone Encounter (Signed)
Pt was seen in office on 12-01-11 she was given rx for abuterol but was given a rx for a nebulizer please call in rx for the nebulizer [facility] [address].

## 2011-12-08 NOTE — Telephone Encounter (Signed)
LMOM to CB. JF 

## 2011-12-09 NOTE — Telephone Encounter (Signed)
Pt reports that she did p/up the inhaler and it seems to work well w/ the Atrovent inhaler. Pt wanted to ask Dr Alwyn Ren if he would be willing to Rx a nebulizer for pt to have at home for use when she has asthma exacerbations d/t illness or in extreme heat? She sometimes needs a nebulizer tx for her asthma at these times.

## 2011-12-09 NOTE — Telephone Encounter (Signed)
Her peak flow was really not very bad when she was here. I do not feel like I can just placing her on a nebulizer machine. If she has further problems we can do coronary function testing and try to determine what the best long term asthma treatment is for her.

## 2011-12-10 NOTE — Telephone Encounter (Signed)
Gave pt message from Dr Alwyn Ren and pt agreed to RTC if she continues to have intermittent problems w/her asthma for possible further testing.

## 2012-01-03 ENCOUNTER — Ambulatory Visit (INDEPENDENT_AMBULATORY_CARE_PROVIDER_SITE_OTHER): Payer: 59 | Admitting: Emergency Medicine

## 2012-01-03 VITALS — BP 122/80 | HR 92 | Temp 98.1°F | Resp 18 | Ht 63.0 in | Wt 144.0 lb

## 2012-01-03 DIAGNOSIS — R05 Cough: Secondary | ICD-10-CM

## 2012-01-03 DIAGNOSIS — R110 Nausea: Secondary | ICD-10-CM

## 2012-01-03 DIAGNOSIS — R059 Cough, unspecified: Secondary | ICD-10-CM

## 2012-01-03 DIAGNOSIS — J329 Chronic sinusitis, unspecified: Secondary | ICD-10-CM

## 2012-01-03 DIAGNOSIS — J45909 Unspecified asthma, uncomplicated: Secondary | ICD-10-CM

## 2012-01-03 MED ORDER — PREDNISONE 20 MG PO TABS: ORAL_TABLET | ORAL | Status: DC

## 2012-01-03 MED ORDER — ALBUTEROL SULFATE (2.5 MG/3ML) 0.083% IN NEBU
2.5000 mg | INHALATION_SOLUTION | Freq: Once | RESPIRATORY_TRACT | Status: AC
  Administered 2012-01-03: 2.5 mg via RESPIRATORY_TRACT

## 2012-01-03 MED ORDER — IPRATROPIUM BROMIDE 0.02 % IN SOLN
0.5000 mg | Freq: Once | RESPIRATORY_TRACT | Status: AC
  Administered 2012-01-03: 0.5 mg via RESPIRATORY_TRACT

## 2012-01-03 MED ORDER — HYDROCOD POLST-CHLORPHEN POLST 10-8 MG/5ML PO LQCR: 5.0000 mL | Freq: Two times a day (BID) | ORAL | Status: DC | PRN

## 2012-01-03 MED ORDER — FLUTICASONE PROPIONATE 50 MCG/ACT NA SUSP: 2.0000 | Freq: Every day | NASAL | Status: DC

## 2012-01-03 MED ORDER — ONDANSETRON 8 MG PO TBDP: 8.0000 mg | ORAL_TABLET | Freq: Three times a day (TID) | ORAL | Status: AC | PRN

## 2012-01-03 MED ORDER — AZITHROMYCIN 250 MG PO TABS: ORAL_TABLET | ORAL | Status: AC

## 2012-01-03 NOTE — Progress Notes (Signed)
  Subjective:    Patient ID: Melanie [name], female    DOB: Mar 24, 1961, 51 y.o.   MRN: [id number]  HPI Has had head cold since Tuesday. Started with sore throat, h/a, facial pain. Feels like it is moving down to her chest. Coughing up green phlegm. Pt has asthma, has been using inhaler. Sometimes has to go on steroids for this. She states sore throat has gotten better by now. Will do duo neb today.    Review of Systems     Objective:   Physical Exam there is tenderness over both maxillary sinuses. The nasal turbinates are significantly swollen to TMs are clear. Posterior pharynx is clear. She had mild prolongation of expiration with occasional wheezing on respiratory exam. Cardiac was regular rate without murmur.        Assessment & Plan:  She may have an upper respiratory infection with flare of her underlying asthma versus allergic trigger. We'll treat with prednisone dosepak and Z-Pak as she has done well with this program in the past. She will continue her  albuterol and Atrovent inhalers, and dulera

## 2012-01-08 ENCOUNTER — Telehealth: Payer: Self-pay

## 2012-01-08 NOTE — Telephone Encounter (Signed)
Left a message for patient to call us back with her current symptoms.

## 2012-01-08 NOTE — Telephone Encounter (Signed)
Pt was seen by Dr. Cleta Alberts 01/03/12.  Has completed antibiotic, has 2 days left on prednisone and doesn't seem to be getting better.  Would like call from clinical.  [phone].

## 2012-01-09 ENCOUNTER — Ambulatory Visit (INDEPENDENT_AMBULATORY_CARE_PROVIDER_SITE_OTHER): Payer: 59 | Admitting: Emergency Medicine

## 2012-01-09 VITALS — BP 122/80 | HR 94 | Temp 97.9°F | Resp 18 | Ht 62.5 in | Wt 145.0 lb

## 2012-01-09 DIAGNOSIS — J018 Other acute sinusitis: Secondary | ICD-10-CM

## 2012-01-09 DIAGNOSIS — R05 Cough: Secondary | ICD-10-CM

## 2012-01-09 DIAGNOSIS — R059 Cough, unspecified: Secondary | ICD-10-CM

## 2012-01-09 DIAGNOSIS — J329 Chronic sinusitis, unspecified: Secondary | ICD-10-CM

## 2012-01-09 DIAGNOSIS — J45909 Unspecified asthma, uncomplicated: Secondary | ICD-10-CM

## 2012-01-09 MED ORDER — PREDNISONE 10 MG PO KIT: 10.0000 mg | PACK | Freq: Every morning | ORAL | Status: DC

## 2012-01-09 MED ORDER — HYDROCOD POLST-CHLORPHEN POLST 10-8 MG/5ML PO LQCR: 5.0000 mL | Freq: Two times a day (BID) | ORAL | Status: DC | PRN

## 2012-01-09 NOTE — Telephone Encounter (Signed)
Patient still has a lot of congestion in her head and chest.  Finished antibiotics and will finish prednisone on Sunday.  Told patient that she should return to clinic for follow up.patient agreed and will be in today.

## 2012-01-09 NOTE — Progress Notes (Signed)
Date:  01/09/2012   Name:  Melanie [name]   DOB:  [birth year]   MRN:  [id number]  PCP:  No primary provider on file.    Chief Complaint: Follow-up   History of Present Illness:  Melanie [name] is a 51 y.o. very pleasant female patient who presents with the following:  Seen on the 27th for sinusitis and bronchitis with bronchospasm.  Says that she has improved but has a persistent cough, wheeze, and nasal discharge.  All her production is mucoid.  Still wheezing despite inhalers in face of decreasing dose prednisone.  Still has 2 doses remaining.  No fever or chills, nausea or vomiting  Patient Active Problem List  Diagnosis  . KNEE PAIN, RIGHT  . BACK PAIN, CHRONIC  . TENDINITIS, PATELLAR  . OTHER ACQUIRED DEFORMITY OF ANKLE AND FOOT OTHER  . ABNORMALITY OF GAIT  . Asthma attack    No past medical history on file.  No past surgical history on file.  History  Substance Use Topics  . Smoking status: Never Smoker   . Smokeless tobacco: Not on file  . Alcohol Use: Not on file    No family history on file.  Allergies  Allergen Reactions  . Penicillins   . Sulfonamide Derivatives     Medication list has been reviewed and updated.  Current Outpatient Prescriptions on File Prior to Visit  Medication Sig Dispense Refill  . albuterol (PROVENTIL HFA;VENTOLIN HFA) 108 (90 BASE) MCG/ACT inhaler Inhale 2 puffs into the lungs every 4 (four) hours as needed for wheezing (cough, shortness of breath or wheezing.).  1 Inhaler  0  . chlorpheniramine-HYDROcodone (TUSSIONEX PENNKINETIC ER) 10-8 MG/5ML LQCR Take 5 mLs by mouth every 12 (twelve) hours as needed (cough).  120 mL  0  . diclofenac sodium (VOLTAREN) 1 % GEL Apply topically 4 (four) times daily. Rub 1 gram to the affected area       . fluticasone (FLONASE) 50 MCG/ACT nasal spray Place 2 sprays into the nose daily.  16 g  6  . ipratropium (ATROVENT HFA) 17 MCG/ACT inhaler Inhale 2 puffs into the lungs every 6 (six) hours.   1 Inhaler  1  . mometasone-formoterol (DULERA) 100-5 MCG/ACT AERO Inhale 2 puffs into the lungs 2 (two) times daily.      . predniSONE (DELTASONE) 20 MG tablet Please take 3 a day for 3 days 2 a day for 3 days one a day for 3 days  18 tablet  0  . azithromycin (ZITHROMAX) 250 MG tablet Take 2 tabs PO x 1 dose, then 1 tab PO QD x 4 days  6 tablet  0  . ondansetron (ZOFRAN-ODT) 8 MG disintegrating tablet Take 1 tablet (8 mg total) by mouth every 8 (eight) hours as needed for nausea.  20 tablet  0    Review of Systems:  As per HPI, otherwise negative.    Physical Examination: Filed Vitals:   01/09/12 1532  BP: 122/80  Pulse: 94  Temp: 97.9 F (36.6 C)  Resp: 18   Filed Vitals:   01/09/12 1532  Height: 5' 2.5" (1.588 m)  Weight: 145 lb (65.772 kg)   Body mass index is 26.10 kg/(m^2). Ideal Body Weight: Weight in (lb) to have BMI = 25: 138.6    GEN: WDWN, NAD, Non-toxic, Alert & Oriented x 3 HEENT: Atraumatic, Normocephalic.  Ears and Nose: No external deformity.  Nasal passages clear no drainage EXTR: No clubbing/cyanosis/edema NEURO: Normal gait.  PSYCH: Normally  interactive. Conversant. Not depressed or anxious appearing.  Calm demeanor.  Chest:  CTA BBSE COR:  RRR no murmur  Assessment and Plan: Increase ventolin from occasional to qid to q4h if needed. Send Rx for sterapred dosepak if she does not improve with ventolin use Tussionex.  Follow up as needed  Carmelina Dane, MD

## 2012-09-03 ENCOUNTER — Ambulatory Visit (INDEPENDENT_AMBULATORY_CARE_PROVIDER_SITE_OTHER): Payer: 59 | Admitting: Physician Assistant

## 2012-09-03 VITALS — BP 118/66 | HR 84 | Temp 98.3°F | Resp 16 | Ht 62.0 in | Wt 148.4 lb

## 2012-09-03 DIAGNOSIS — J45909 Unspecified asthma, uncomplicated: Secondary | ICD-10-CM

## 2012-09-03 DIAGNOSIS — R05 Cough: Secondary | ICD-10-CM

## 2012-09-03 DIAGNOSIS — R059 Cough, unspecified: Secondary | ICD-10-CM

## 2012-09-03 DIAGNOSIS — R062 Wheezing: Secondary | ICD-10-CM

## 2012-09-03 DIAGNOSIS — J329 Chronic sinusitis, unspecified: Secondary | ICD-10-CM

## 2012-09-03 DIAGNOSIS — J45901 Unspecified asthma with (acute) exacerbation: Secondary | ICD-10-CM

## 2012-09-03 MED ORDER — ALBUTEROL SULFATE HFA 108 (90 BASE) MCG/ACT IN AERS: 2.0000 | INHALATION_SPRAY | RESPIRATORY_TRACT | Status: DC | PRN

## 2012-09-03 MED ORDER — LEVALBUTEROL HCL 0.63 MG/3ML IN NEBU
0.6300 mg | INHALATION_SOLUTION | Freq: Once | RESPIRATORY_TRACT | Status: AC
  Administered 2012-09-03: 0.63 mg via RESPIRATORY_TRACT

## 2012-09-03 MED ORDER — AZITHROMYCIN 500 MG PO TABS: 500.0000 mg | ORAL_TABLET | Freq: Every day | ORAL | Status: DC

## 2012-09-03 MED ORDER — IPRATROPIUM BROMIDE 0.06 % NA SOLN: 2.0000 | Freq: Three times a day (TID) | NASAL | Status: DC

## 2012-09-03 MED ORDER — IPRATROPIUM BROMIDE 0.02 % IN SOLN
0.5000 mg | Freq: Once | RESPIRATORY_TRACT | Status: AC
  Administered 2012-09-03: 0.5 mg via RESPIRATORY_TRACT

## 2012-09-03 MED ORDER — IPRATROPIUM BROMIDE HFA 17 MCG/ACT IN AERS: 2.0000 | INHALATION_SPRAY | Freq: Four times a day (QID) | RESPIRATORY_TRACT | Status: DC

## 2012-09-03 MED ORDER — HYDROCOD POLST-CHLORPHEN POLST 10-8 MG/5ML PO LQCR: 5.0000 mL | Freq: Two times a day (BID) | ORAL | Status: DC | PRN

## 2012-09-03 NOTE — Progress Notes (Signed)
Patient ID: BECKEY POLKOWSKI MRN: [id number], DOB: [birth year], 52 y.o. Date of Encounter: 09/03/2012, 5:41 PM  Primary Physician: No primary provider on file.  Chief Complaint:  Chief Complaint  Patient presents with  . Nasal Congestion    head and chest congestion fever sore throat x 2 days    HPI: 52 y.o. female presents with a 2-3 day history of nasal congestion, post nasal drip, sore throat, sinus pressure, and cough. Subjective fever and chills. Nasal congestion thick and green/yellow. Cough is productive of green/yellow sputum in the morning and clear sputum throughout the day. She is coughing throughout the day, however it seems to be worse at night. Some wheezing and shortness of breath. Ears feel full, leading to sensation of muffled hearing. Has tried her ventolin without success. No GI complaints. Appetite decreased. Multiple sick contacts at work. No recent antibiotics or recent travels. No leg trauma, sedentary periods, h/o cancer, or tobacco use. Asthma is well controlled at baseline. She used her incentive spirometer the previous night and "it was in the yellow." "It is usually in the green."   Past Medical History  Diagnosis Date  . Allergy   . Asthma   . Osteoporosis      Home Meds: Prior to Admission medications   Medication Sig Start Date End Date Taking? Authorizing Provider  albuterol (PROVENTIL HFA;VENTOLIN HFA) 108 (90 BASE) MCG/ACT inhaler Inhale 2 puffs into the lungs every 4 (four) hours as needed for wheezing (cough, shortness of breath or wheezing.). 12/07/11 12/06/12 Yes Chelle S Jeffery, PA-C  ipratropium (ATROVENT HFA) 17 MCG/ACT inhaler Inhale 2 puffs into the lungs every 6 (six) hours. 12/01/11 11/30/12 Yes Peyton Najjar, MD  mometasone-formoterol (DULERA) 100-5 MCG/ACT AERO Inhale 2 puffs into the lungs 2 (two) times daily.   No Historical Provider, MD    Allergies:  Allergies  Allergen Reactions  . Penicillins   . Sulfonamide Derivatives      History   Social History  . Marital Status: Divorced    Spouse Name: N/A    Number of Children: N/A  . Years of Education: N/A   Occupational History  . Not on file.   Social History Main Topics  . Smoking status: Never Smoker   . Smokeless tobacco: Not on file  . Alcohol Use: Not on file  . Drug Use: Not on file  . Sexually Active: Not on file   Other Topics Concern  . Not on file   Social History Narrative  . No narrative on file     Review of Systems: Constitutional: positive for fever, chills, and fatigue. negative for night sweats or weight changes Cardiovascular: negative for chest pain or palpitations Respiratory: positive for cough, wheezing, and shortness of breath. negative for hemoptysis, dyspnea, or tachypnea Abdominal: negative for abdominal pain, nausea, vomiting or diarrhea Dermatological: negative for rash Neurologic: negative for headache   Physical Exam: Blood pressure 118/66, pulse 84, temperature 98.3 F (36.8 C), temperature source Oral, resp. rate 16, height 5\' 2"  (1.575 m), weight 148 lb 6.4 oz (67.314 kg), last menstrual period 07/06/2012, SpO2 99.00%., Body mass index is 27.14 kg/(m^2). General: Well developed, well nourished, in no acute distress. Head: Normocephalic, atraumatic, eyes without discharge, sclera non-icteric, nares are congested. Bilateral auditory canals clear, TM's are without perforation, pearly grey with reflective cone of light bilaterally. Maxillary and frontal sinus TTP. Oral cavity moist, dentition normal. Posterior pharynx with post nasal drip and mild erythema. No peritonsillar abscess or tonsillar  exudate. Uvula midline.  Neck: Supple. No thyromegaly. Full ROM. No lymphadenopathy. Lungs: Mild bilateral expiratory wheezing without rales or rhonchi. Breathing is unlabored. Status post Xopenex and Atrovent nebulizer: Patient feels much better. "I can take a deep breath." Lungs clear to auscultation bilaterally.  Heart: RRR  with S1 S2. No murmurs, rubs, or gallops appreciated. Msk:  Strength and tone normal for age. Extremities: No clubbing or cyanosis. No edema. Neuro: Alert and oriented X 3. Moves all extremities spontaneously. CNII-XII grossly in tact. Psych:  Responds to questions appropriately with a normal affect.     ASSESSMENT AND PLAN:  52 y.o. female with mild asthma exacerbation, sinobronchitis, cough, wheezing,  -Xopenex and Atrovent nebulizer in office -Azithromycin 500 mg 1 po daily #5 no RF -Proventil 2 puffs inhaled q 4-6 hours prn #1 RF 2 -Atrovent HFA 2 puffs inhaled q 6 hours #1 RF 2 -Tussionex 1 tsp po q 12 hours prn cough #120 mL no RF, SED -Mucinex -Tylenol/Motrin prn -Rest/fluids -RTC precautions -RTC 3-5 days if no improvement  Elinor Dodge, PA-C 09/03/2012 5:41 PM

## 2012-09-04 ENCOUNTER — Telehealth: Payer: Self-pay

## 2012-09-04 NOTE — Telephone Encounter (Signed)
PT SAW RYAN YESTERDAY AND FORGOT TO ASK HIM FOR A NOTE FOR WORK.  PLEASE FAX NOTE TO [phone].  THIS IS HER FAX AT WORK.  IF YOU NEED TO CALL HER YOU CAN REACH HER AT [phone]

## 2012-09-04 NOTE — Telephone Encounter (Signed)
Spoke with patient to confirm dates needed for work note. She just needed for yesterday. Work note done and faxed.

## 2012-09-05 ENCOUNTER — Telehealth: Payer: Self-pay

## 2012-09-05 NOTE — Telephone Encounter (Signed)
Note extended to RTW on Tuesday ,  April 1  Fax to [phone]  Attn:  Jake Seats   [phone]

## 2012-09-06 NOTE — Telephone Encounter (Signed)
Faxed note advised patient.

## 2012-09-07 ENCOUNTER — Telehealth: Payer: Self-pay

## 2012-09-07 NOTE — Telephone Encounter (Signed)
Noted  

## 2012-09-07 NOTE — Telephone Encounter (Signed)
Finished all meds,  Not any better  Please call [phone]

## 2012-09-07 NOTE — Telephone Encounter (Signed)
Spoke to her to advise. The Z pack will continue to work for her even after she has completed the course, for her  congestion, she states this continues she is advised to continue nasal spray. She is also advised to add in Mucinex and (Zyrtec or Claritin) if she does not improve with these or if she worsens she will return to clinic. To you FYI

## 2012-09-28 ENCOUNTER — Ambulatory Visit (INDEPENDENT_AMBULATORY_CARE_PROVIDER_SITE_OTHER): Payer: 59 | Admitting: Family Medicine

## 2012-09-28 ENCOUNTER — Telehealth: Payer: Self-pay

## 2012-09-28 VITALS — BP 122/80 | HR 81 | Temp 97.8°F | Resp 18 | Ht 62.0 in | Wt 150.0 lb

## 2012-09-28 DIAGNOSIS — J309 Allergic rhinitis, unspecified: Secondary | ICD-10-CM

## 2012-09-28 DIAGNOSIS — J302 Other seasonal allergic rhinitis: Secondary | ICD-10-CM | POA: Insufficient documentation

## 2012-09-28 MED ORDER — FLUTICASONE PROPIONATE 50 MCG/ACT NA SUSP: 2.0000 | Freq: Every day | NASAL | Status: DC

## 2012-09-28 NOTE — Telephone Encounter (Signed)
Patient says she has asthma and the pollen is bothering her really bad, would like to know if there is something stronger we could call in for her than what she is taking [phone]

## 2012-09-28 NOTE — Progress Notes (Signed)
Melanie [name] is a 52 y.o. female who presents to [facility] today for seasonal allergy symptoms associated with cough. Present for the last several weeks. Patient has a long history of seasonal allergies with asthma. She was seen approximately one month ago for sinusitis. This was well treated with azithromycin.  She did well until recently with the worsening pollen. She notes coughing and runny nose and itchy eyes. She denies any trouble breathing chest pains or palpitations. She is taking Claritin Atrovent and albuterol.  She feels well otherwise   PMH: Reviewed seasonal allergies History  Substance Use Topics  . Smoking status: Never Smoker   . Smokeless tobacco: Not on file  . Alcohol Use: Yes     Comment: 2/wk   ROS as above  Medications reviewed. Current Outpatient Prescriptions  Medication Sig Dispense Refill  . albuterol (PROVENTIL HFA;VENTOLIN HFA) 108 (90 BASE) MCG/ACT inhaler Inhale 2 puffs into the lungs every 4 (four) hours as needed for wheezing (cough, shortness of breath or wheezing.).  1 Inhaler  2  . ipratropium (ATROVENT HFA) 17 MCG/ACT inhaler Inhale 2 puffs into the lungs every 6 (six) hours.  1 Inhaler  2  . loratadine (CLARITIN) 10 MG tablet Take 10 mg by mouth daily.      [name] azithromycin (ZITHROMAX) 500 MG tablet Take 1 tablet (500 mg total) by mouth daily.  5 tablet  0  . chlorpheniramine-HYDROcodone (TUSSIONEX PENNKINETIC ER) 10-8 MG/5ML LQCR Take 5 mLs by mouth every 12 (twelve) hours as needed (cough).  120 mL  0  . fluticasone (FLONASE) 50 MCG/ACT nasal spray Place 2 sprays into the nose daily.  16 g  6  . mometasone-formoterol (DULERA) 100-5 MCG/ACT AERO Inhale 2 puffs into the lungs 2 (two) times daily.       No current facility-administered medications for this visit.    Exam:  BP 122/80  Pulse 81  Temp(Src) 97.8 F (36.6 C) (Oral)  Resp 18  Ht 5\' 2"  (1.575 m)  Wt 150 lb (68.04 kg)  BMI 27.43 kg/m2  SpO2 100%  LMP 07/06/2012 Gen: Well NAD HEENT:  EOMI,  MMM, erythematous nasal turbinates normal tympanic membranes bilaterally cobblestoning of the posterior pharynx Lungs: CTABL Nl WOB Heart: RRR no MRG Abd: NABS, NT, ND Exts: Non edematous BL  LE, warm and well perfused.   No results found for this or any previous visit (from the past 72 hour(s)).  Assessment and Plan: 52 y.o. female with seasonal allergy. Discontinue Atrovent nasal spray. Initiate Flonase nasal spray. Increase Claritin to 20 mg daily followup as needed. Discussed warning signs or symptoms. Please see discharge instructions. Patient expresses understanding.

## 2012-09-28 NOTE — Patient Instructions (Addendum)
Thank you for coming in today. Try the flonase nasal spray daily for a month.  Also you can double up the claritin.  Come back if you are getting worse or no getting better.  Call or go to the emergency room if you get worse, have trouble breathing, have chest pains, or palpitations.

## 2012-09-28 NOTE — Telephone Encounter (Signed)
She should return to clinic for this. Called her to advise. To you FYI

## 2012-09-28 NOTE — Telephone Encounter (Signed)
Agree she should RTC.

## 2012-11-08 ENCOUNTER — Ambulatory Visit (INDEPENDENT_AMBULATORY_CARE_PROVIDER_SITE_OTHER): Payer: 59 | Admitting: Physician Assistant

## 2012-11-08 VITALS — BP 112/76 | HR 66 | Temp 98.4°F | Resp 16 | Ht 62.5 in | Wt 150.6 lb

## 2012-11-08 DIAGNOSIS — G43909 Migraine, unspecified, not intractable, without status migrainosus: Secondary | ICD-10-CM

## 2012-11-08 MED ORDER — KETOROLAC TROMETHAMINE 60 MG/2ML IM SOLN
60.0000 mg | Freq: Once | INTRAMUSCULAR | Status: AC
  Administered 2012-11-08: 60 mg via INTRAMUSCULAR

## 2012-11-08 MED ORDER — ZOLMITRIPTAN 5 MG PO TABS: ORAL_TABLET | ORAL | Status: DC

## 2012-11-08 MED ORDER — PROMETHAZINE HCL 25 MG PO TABS: 25.0000 mg | ORAL_TABLET | Freq: Three times a day (TID) | ORAL | Status: DC | PRN

## 2012-11-08 NOTE — Progress Notes (Signed)
  Subjective:    Patient ID: Melanie [name], female    DOB: 15-May-1961, 52 y.o.   MRN: [id number]  HPI  [name] is a 52 year old female who presents with migraine x 2 days. Migraine started on Saturday afternoon with head pain and dizziness, progressively worsened on Sunday with nausea, vomiting, left facial paresthesias, neck pain, light/sound sensitivity and blurry vision. She states this is typical of her previous headaches. Maxalt was used, but only provided mild relief. Pain is primarily in left occipital region and into the neck. She has approximately 2-3 migraines per year. Last visit to urgent care [facility] [place]) with migraine was treated with Toradol and Phenergan which provided relief.  Denies: head trauma, hematemesis, numbness/tingling in extremities, slurred speech, gait difficulties, or weakness. SHe would like to try a different abortive migraine medication at this time as well.    Past medical history: multiple lapartomy surgeries for endometrious, knee reconstruction surgery Family history: multiple family members with heart disease, MIs   Review of Systems Denies: shortness of breath, chest pain, chest tightness, urinary symptoms, diarrhea/constipation    Objective:   Physical Exam  General: WDWN female, laying in dark exam room, appears moderately uncomfortable but in no acute distress  HEENT: normocephalic, atraumatic, PERLA, sclera/conjunctiva clear, pupils 2 mm equal and reactive bilaterally, TMs clear with visible bony landmarks, turbinates pink, moist mucous membranes, uvula midline   Neck: supple, no JVD, full ROM, no tenderness to palpation, no nuchal rigidity  Resp: clear to ausculation anterior and posterior fields bilaterally, no rales/rhonchi/wheezes  Cardiac: RRR, no murmurs/rubs/gallops Abdomen: soft, non distended, non tender to palpation, normal bowel sounds Neuro: alert & oriented x 3, cranial nerves II-XII intact, no focal deficit,  cerebellar intact, equal sensation along bilateral face, DTR 2+ throughout all extremities Skin: no rashes, lesions  No results found for this or any previous visit.     Assessment & Plan:  Migraine, unspecified, without mention of intractable migraine without mention of status migrainosus - Plan: ketorolac (TORADOL) injection 60 mg, promethazine (PHENERGAN) 25 MG tablet, zolmitriptan (ZOMIG) 5 MG tablet  Encourage rest and hydration  Use Zomig as rescue medication for future migraines Use Phenergan as needed for migraine associated nausea/vomiting Return if symptoms do not improve or new symptoms develop  Patient seen and examined with PA student.   If patient does not improve she will RTC for possible scan.  Given patient's 25 year history of migraines with above symptoms will treat with the above. However she has been given RTC/ED precautions.   Eula Listen, PA-C 11/08/2012 1:03 PM

## 2012-11-09 ENCOUNTER — Other Ambulatory Visit: Payer: Self-pay

## 2012-11-09 DIAGNOSIS — Z1231 Encounter for screening mammogram for malignant neoplasm of breast: Secondary | ICD-10-CM

## 2012-11-25 ENCOUNTER — Ambulatory Visit
Admission: RE | Admit: 2012-11-25 | Discharge: 2012-11-25 | Disposition: A | Discharge status: Home / Self Care | Payer: 59 | Source: Ambulatory Visit

## 2012-11-25 DIAGNOSIS — Z1231 Encounter for screening mammogram for malignant neoplasm of breast: Secondary | ICD-10-CM

## 2012-11-26 ENCOUNTER — Encounter: Payer: Self-pay | Admitting: Sports Medicine

## 2012-11-26 ENCOUNTER — Ambulatory Visit (INDEPENDENT_AMBULATORY_CARE_PROVIDER_SITE_OTHER): Payer: 59 | Admitting: Sports Medicine

## 2012-11-26 VITALS — BP 120/70 | Ht 62.0 in | Wt 147.0 lb

## 2012-11-26 DIAGNOSIS — M999 Biomechanical lesion, unspecified: Secondary | ICD-10-CM | POA: Insufficient documentation

## 2012-11-26 DIAGNOSIS — M62838 Other muscle spasm: Secondary | ICD-10-CM

## 2012-11-26 DIAGNOSIS — M9981 Other biomechanical lesions of cervical region: Secondary | ICD-10-CM

## 2012-11-26 MED ORDER — CYCLOBENZAPRINE HCL 5 MG PO TABS: 5.0000 mg | ORAL_TABLET | Freq: Three times a day (TID) | ORAL | Status: DC | PRN

## 2012-11-26 NOTE — Assessment & Plan Note (Signed)
Patient responded well to osteopathic manipulation therapy. Patient was given some Flexeril to have on hand. Discuss different exercises she can do at home that will be helpful. Encourage cryotherapy. Discuss sleeping position. Patient may try acupuncture. Patient to follow up on an as-needed basis.

## 2012-11-26 NOTE — Progress Notes (Signed)
Chief complaint left neck pain  History of present illness: Patient is a 52 year old female coming in with one month history of posterior neck pain. Patient states that this came on gradually. Patient describes the pain as more of a dull aching sensation that is worse with certain movements and when she wakes up in the morning. Patient denies any injury to this area. Patient denies any radiation, any shoulder pain, or any weakness or numbness in the upper extremity. Patient also denies any headache. Patient to states that this is a nagging soreness that is unfortunately starting to affect her daily activities such as sitting in front of a computer for long amount of time.  Past Medical History  Diagnosis Date  . Allergy   . Asthma   . Osteoporosis    Past Surgical History  Procedure Laterality Date  . Appendectomy     History  Substance Use Topics  . Smoking status: Never Smoker   . Smokeless tobacco: Not on file  . Alcohol Use: Yes     Comment: 2/wk   Family History  Problem Relation Age of Onset  . Heart disease Father   . Irregular heart beat Mother   . Stroke Maternal Grandmother   . Heart attack Maternal Grandfather   . Stroke Paternal Grandmother   . Heart attack Paternal Grandfather   . Heart attack Brother    Phthisical exam Blood pressure 120/70, height 5\' 2"  (1.575 m), weight 147 lb (66.679 kg). General: No apparent distress alert and oriented x3 mood and affect normal Respiratory: Patient's speak in full sentences and does not appear short of breath Skin: Warm dry intact with no signs of infection or rash Neuro: Cranial nerves II through XII are intact, neurovascularly intact in all extremities with 2+ DTRs and 2+ pulses. Neck: Negative spurling's Full neck range of motion but tender in extreme rotation to left on left side.  Grip strength and sensation normal in bilateral hands Strength good C4 to T1 distribution No sensory change to C4 to T1 Reflexes  normal Patient does have tenderness to the trapezius that does go down the paraspinal musculature of the thoracic spine as well to T3 level. There is some fullness of the muscle itself.  Osteopathic findings C4 flexed rotated and side bent left T4 extended rotated and side bent left

## 2012-11-26 NOTE — Assessment & Plan Note (Signed)
Decision today to treat with OMT was based on Physical Exam  After verbal consent patient was treated with HVLA techniques in thoracic areas  Patient tolerated the procedure well with improvement in symptoms  Patient given exercises, stretches and lifestyle modifications  See medications in patient instructions if given  Patient will follow up in 4-6 weeks  

## 2012-11-26 NOTE — Patient Instructions (Addendum)
Very nice to meet you Try the exercises I have given you. Try to do them daily. OK to continue your regular exercises just try to make the adjustments we talked about Remember sleeping position. I am giving you a muscle relaxant to help.  If the manipulation helped I will be at [facility] if it helps.

## 2013-03-12 ENCOUNTER — Ambulatory Visit (INDEPENDENT_AMBULATORY_CARE_PROVIDER_SITE_OTHER): Payer: 59 | Admitting: Family Medicine

## 2013-03-12 VITALS — BP 120/70 | HR 84 | Temp 98.0°F | Resp 16 | Ht 62.5 in | Wt 150.0 lb

## 2013-03-12 DIAGNOSIS — T753XXS Motion sickness, sequela: Secondary | ICD-10-CM

## 2013-03-12 DIAGNOSIS — R002 Palpitations: Secondary | ICD-10-CM

## 2013-03-12 DIAGNOSIS — T7589XS Other specified effects of external causes, sequela: Secondary | ICD-10-CM

## 2013-03-12 DIAGNOSIS — R110 Nausea: Secondary | ICD-10-CM

## 2013-03-12 DIAGNOSIS — J309 Allergic rhinitis, unspecified: Secondary | ICD-10-CM

## 2013-03-12 DIAGNOSIS — J45909 Unspecified asthma, uncomplicated: Secondary | ICD-10-CM

## 2013-03-12 LAB — TSH: TSH: 1.65 u[IU]/mL (ref 0.350–4.500)

## 2013-03-12 LAB — POCT CBC
Hemoglobin: 12.6 g/dL (ref 12.2–16.2)
MCHC: 31.3 g/dL — AB (ref 31.8–35.4)
MID (cbc): 0.5 (ref 0–0.9)
MPV: 11 fL (ref 0–99.8)
POC Granulocyte: 3.3 (ref 2–6.9)
POC MID %: 8.9 %M (ref 0–12)
Platelet Count, POC: 141 10*3/uL — AB (ref 142–424)
RBC: 4.12 M/uL (ref 4.04–5.48)

## 2013-03-12 MED ORDER — BECLOMETHASONE DIPROPIONATE 40 MCG/ACT IN AERS: 1.0000 | INHALATION_SPRAY | Freq: Two times a day (BID) | RESPIRATORY_TRACT | Status: DC

## 2013-03-12 MED ORDER — SCOPOLAMINE 1 MG/3DAYS TD PT72: 1.0000 | MEDICATED_PATCH | TRANSDERMAL | Status: DC

## 2013-03-12 MED ORDER — ONDANSETRON 8 MG PO TBDP: 8.0000 mg | ORAL_TABLET | Freq: Three times a day (TID) | ORAL | Status: DC | PRN

## 2013-03-12 MED ORDER — DESLORATADINE 5 MG PO TABS: 2.5000 mg | ORAL_TABLET | Freq: Every day | ORAL | Status: DC

## 2013-03-12 NOTE — Progress Notes (Addendum)
Subjective:    Patient ID: Melanie [name], female    DOB: August 02, 1960, 52 y.o.   MRN: [id number] No chief complaint on file.   HPI HPI Comments: Melanie [name] is a 52 y.o. female who presents to the [facility] experiencing concerning episodes of palpitations, lightheadedness, and weakness immed after intense aerobic exercise.  She goes to the gym regularly but for the past 2 wks after she got off the treadmill would become tachycardic, SOB, dizziness, and fatigue. Initially thought it might be her asthma but does not feel like she is having trouble breathing, chest tightness, or wheezing during these episodes.  Pt is VERY sensitive to most medications and states that she can't take antihistamines or decongestants at all as though cause palpitations.  States she prev had a complete cardiovascular eval inc event monitor which confirmed this. Was put on some "heart medication" to prevent it but made sxs worst. However, she has severe seasonal allergies and asthma problems with that so she is currently taking Proventil and Atrovent as needed which is generally first thing every morning despite these also triggering palpatitations. States she has been on long-acting asthma inhalers prev, like Advair, but all caused similar side effects.  She takes Proventil when her chest is feeling "tight" and when her peak flow is yellow which has been frequently lately.  Pt's allergies trigger asthma and so takes Claritin daily - has tried allegra, zyrtec, singulair but experienced side effects.  Pt has never seen a pulmonologist or allergist.    Pt is perimenopausal and last labs showed nml thyroidl.  PCP- Dr. Allyne Gee  Allergies  Allergen Reactions   Penicillins    Sulfonamide Derivatives    Current Outpatient Prescriptions on File Prior to Visit  Medication Sig Dispense Refill   albuterol (PROVENTIL HFA;VENTOLIN HFA) 108 (90 BASE) MCG/ACT inhaler Inhale 2 puffs into the lungs every 4 (four) hours as needed  for wheezing (cough, shortness of breath or wheezing.).  1 Inhaler  2   cyclobenzaprine (FLEXERIL) 5 MG tablet Take 1 tablet (5 mg total) by mouth 3 (three) times daily as needed for muscle spasms.  30 tablet  0   fluticasone (FLONASE) 50 MCG/ACT nasal spray Place 2 sprays into the nose daily.  16 g  6   ipratropium (ATROVENT HFA) 17 MCG/ACT inhaler Inhale 2 puffs into the lungs every 6 (six) hours.  1 Inhaler  2   loratadine (CLARITIN) 10 MG tablet Take 10 mg by mouth daily.       promethazine (PHENERGAN) 25 MG tablet Take 1 tablet (25 mg total) by mouth every 8 (eight) hours as needed for nausea.  20 tablet  0   zolmitriptan (ZOMIG) 5 MG tablet Take 1 tab po prn migraine. May repeat in 2 hours x 1 in HA recurs. Max 10 mg in 24 hours.  10 tablet  8   azithromycin (ZITHROMAX) 500 MG tablet Take 1 tablet (500 mg total) by mouth daily.  5 tablet  0   chlorpheniramine-HYDROcodone (TUSSIONEX PENNKINETIC ER) 10-8 MG/5ML LQCR Take 5 mLs by mouth every 12 (twelve) hours as needed (cough).  120 mL  0   mometasone-formoterol (DULERA) 100-5 MCG/ACT AERO Inhale 2 puffs into the lungs 2 (two) times daily.       rizatriptan (MAXALT-MLT) 10 MG disintegrating tablet Take 10 mg by mouth as needed for migraine. May repeat in 2 hours if needed       No current facility-administered medications on file prior to visit.  Past Medical History  Diagnosis Date   Allergy    Asthma    Osteoporosis    History   Social History   Marital Status: Divorced    Spouse Name: N/A    Number of Children: N/A   Years of Education: N/A   Occupational History   Not on file.   Social History Main Topics   Smoking status: Never Smoker    Smokeless tobacco: Not on file   Alcohol Use: Yes     Comment: 2/wk   Drug Use: No   Sexual Activity: Not on file   Other Topics Concern   Not on file   Social History Narrative   No narrative on file   Review of Systems  Constitutional: Positive for  diaphoresis and fatigue. Negative for fever, chills, activity change, appetite change and unexpected weight change.  HENT: Positive for congestion and rhinorrhea.   Respiratory: Positive for shortness of breath. Negative for cough, chest tightness, wheezing and stridor.   Cardiovascular: Positive for palpitations. Negative for chest pain and leg swelling.  Skin: Negative for rash.  Neurological: Positive for dizziness, weakness and light-headedness. Negative for tremors, seizures, syncope, facial asymmetry, speech difficulty and numbness.  Psychiatric/Behavioral: The patient is not nervous/anxious.    BP 120/70   Pulse 84   Temp(Src) 98 F (36.7 C) (Oral)   Resp 16   Ht 5' 2.5" (1.588 m)   Wt 150 lb (68.04 kg)   BMI 26.98 kg/m2   SpO2 100%    Objective:   Physical Exam  Nursing note and vitals reviewed. Constitutional: She is oriented to person, place, and time. She appears well-developed and well-nourished. No distress.  HENT:  Head: Normocephalic and atraumatic.  Right Ear: External ear normal.  Left Ear: External ear normal.  Eyes: Conjunctivae are normal. No scleral icterus.  Neck: Normal range of motion. Neck supple. No thyromegaly present.  Cardiovascular: Normal rate, regular rhythm, normal heart sounds and intact distal pulses.   Pulmonary/Chest: Effort normal and breath sounds normal. Not tachypneic. No respiratory distress. She has no decreased breath sounds. She has no wheezes.  Musculoskeletal: She exhibits no edema.  Lymphadenopathy:    She has no cervical adenopathy.  Neurological: She is alert and oriented to person, place, and time.  Skin: Skin is warm and dry. She is not diaphoretic. No erythema.  Psychiatric: She has a normal mood and affect. Her behavior is normal.   Results for orders placed in visit on 03/12/13  POCT CBC      Result Value Range   WBC 5.8  4.6 - 10.2 K/uL   Lymph, poc 2.0  0.6 - 3.4   POC LYMPH PERCENT 34.9  10 - 50 %L   MID (cbc) 0.5  0 - 0.9    POC MID % 8.9  0 - 12 %M   POC Granulocyte 3.3  2 - 6.9   Granulocyte percent 56.2  37 - 80 %G   RBC 4.12  4.04 - 5.48 M/uL   Hemoglobin 12.6  12.2 - 16.2 g/dL   HCT, POC 40.9  81.1 - 47.9 %   MCV 97.7 (*) 80 - 97 fL   MCH, POC 30.6  27 - 31.2 pg   MCHC 31.3 (*) 31.8 - 35.4 g/dL   RDW, POC 91.4     Platelet Count, POC 141 (*) 142 - 424 K/uL   MPV 11.0  0 - 99.8 fL      [facility] reading (PRIMARY) by  Dr. Clelia Croft. EKG: NSR, no ischemic changes. PFTs wnl Assessment & Plan:   Palpitations - Plan: POCT CBC, TSH, Pulmonary function test, EKG 12-Lead, Ambulatory referral to Cardiology - suspect worsening asthma control as the cause of pt's sxs so will start qvar.  Pt is also very sensitive and stated that antihistamines made her heart race even though she is taking claritin.  Try cutting the claritin in half and if still have sxs on that, try switching to the clarinex. May benefit from pulmonology or allergy eval if sxs continue.  However, will refer for repeat cardiology eval now due to unknown h/o arrhythmia per pt.  Asthma, chronic - Plan: POCT CBC, TSH, Pulmonary function test - PFTs can be viewed under flow sheet.  Allergic rhinitis - Plan: POCT CBC, TSH, Pulmonary function test  Motion sickness, sequela with nausea - scopolamine patch and zofran refilled for her upcoming cruise.  Meds ordered this encounter  Medications   beclomethasone (QVAR) 40 MCG/ACT inhaler    Sig: Inhale 1 puff into the lungs 2 (two) times daily.    Dispense:  1 Inhaler    Refill:  2   desloratadine (CLARINEX) 5 MG tablet    Sig: Take 0.5 tablets (2.5 mg total) by mouth daily.    Dispense:  30 tablet    Refill:  5   scopolamine (TRANSDERM-SCOP) 1.5 MG    Sig: Place 1 patch (1.5 mg total) onto the skin every 3 (three) days.    Dispense:  10 patch    Refill:  1   ondansetron (ZOFRAN-ODT) 8 MG disintegrating tablet    Sig: Take 1 tablet (8 mg total) by mouth every 8 (eight) hours as needed for nausea.     Dispense:  30 tablet    Refill:  0    Norberto Sorenson, MD MPH

## 2013-03-12 NOTE — Patient Instructions (Addendum)
Cut the claritin in 1/2.  Start the Qvar twice a day.  In 2 weeks if you are doing ok but still having some symptoms, try stopping the claritin altogether or switching to the clarinex instead. \ Asthma, Adult Asthma is a condition that affects your lungs. It is characterized by swelling and narrowing of your airways as well as increased mucus production. The narrowing comes from swelling and muscle spasms inside the airways. When this happens, breathing can be difficult and you can have coughing, wheezing, and shortness of breath. Knowing more about asthma can help you manage it better. Asthma cannot be cured, but medicines and lifestyle changes can help control it. Asthma can be a minor problem for some people but if it is not controlled it can lead to a life-threatening asthma attack. Asthma can change over time. It is important to work with your caregiver to manage your asthma symptoms. CAUSES The exact cause of asthma is unknown. Asthma is believed to be caused by inherited (genetic) and environmental exposures. Swelling and redness (inflammation) of the airways occurs in asthma. This can be triggered by allergies, viral lung infections, or irritants in the air. Allergic reactions can cause you to wheeze immediately or several hours after an exposure. Asthma triggers are different for each person. It is important to pay attention and know what triggers your asthma.  Common triggers for asthma attacks include:  Animal dander from the skin, hair, or feathers of animals.  Dust mites contained in house dust.  Cockroaches.  Pollen from trees or grass.  Mold.  Cigarette or tobacco smoke. Smoking cannot be allowed in homes of people with asthma. People with asthma should not smoke and should not be around smokers.  Air pollutants such as dust, household cleaners, hair sprays, aerosol sprays, paint fumes, strong chemicals, or strong odors.  Cold air or weather changes. Cold air may cause  inflammation. Winds increase molds and pollens in the air. There is not one best climate for people with asthma.  Strong emotions such as crying or laughing hard.  Stress.  Certain medicines such as aspirin or beta-blockers.  Sulfites in such foods and drinks as dried fruits and wine.  Infections or inflammatory conditions such as the flu, a cold, or an inflammation of the nasal membranes (rhinitis).  Gastroesophageal reflux disease (GERD). GERD is a condition where stomach acid backs up into your throat (esophagus).  Exercise or strenous activity. Proper pre-exercise medicines allow most people to participate in sports. SYMPTOMS  Feeling short of breath.  Chest tightness or pain.  Difficulty sleeping due to coughing, wheezing, or feeling short of breath.  A whistling or wheezing sound with exhalation.  Coughing or wheezing that is worse when you:  Have a virus (such as a cold or the flu).  Are suffering from allergies.  Are exposed to certain fumes or chemicals.  Exercise. Signs that your asthma is probably getting worse include:   More frequent and bothersome asthma signs and symptoms.  Increasing difficulty breathing. This can be measured by a peak flow meter, which is a simple device used to check how well your lungs are working.  An increasingly frequent need to use a quick-relief inhaler. DIAGNOSIS  The diagnosis of asthma is made by review of your medical history, a physical exam, and possibly from other tests. Lung function studies may help with the diagnosis. TREATMENT  Asthma cannot be cured. However, for the majority of adults, asthma can be controlled with treatment. Besides avoidance of  triggers of your asthma, medicines are often required. There are 2 classes of medicine used for asthma treatment: controller medicines (reduce inflammation and symptoms) andreliever or rescue medicines (relieve asthma symptoms during acute attacks). You may require daily  medicines to control your asthma. The most effective long-term controller medicines for asthma are inhaled corticosteroids (blocks inflammation). Other long-term control medicines include:  Leukotriene receptor antagonists (blocks a pathway of inflammation).  Long-acting beta2-agonists (relaxes the muscles of the airways for at least 12 hours) with an inhaled corticosteroid.  Cromolyn sodium or nedocromil (alters certain inflammatory cells' ability to release chemicals that cause inflammation).  Immunomodulators (alters the immune system to prevent asthma symptoms).  Theophylline (relaxes muscles in the airways). You may also require a short-acting beta2-agonist to relieve asthma symptoms during an acute attack. You should understand what to do during an acute attack. Inhaled medicines are effective when used properly. Read the instructions on how to use your medicines correctly and speak to your caregiver if you have questions. Follow up with your caregiver on a regular basis to make sure your asthma is well-controlled. If your asthma is not well-controlled, if you have been hospitalized for asthma, or if multiple medicines or medium to high doses of inhaled corticosteroids are needed to control your asthma, request a referral to an asthma specialist. HOME CARE INSTRUCTIONS   Take medicines as directed by your caregiver.  Control your home environment in the following ways to help prevent asthma attacks:  Change your heating and air conditioning filter at least once a month.  Place a filter or cheesecloth over your heating and air conditioning vents.  Limit the use of fireplaces and wood stoves.  Do not smoke. Do not stay in places where others are smoking.  Get rid of pests (such as roaches and mice) and their droppings.  If you see mold on a plant, throw it away.  Clean your floors and dust every week. Use unscented cleaning products. Use a vacuum cleaner with a HEPA filter if  possible. If vacuuming or cleaning triggers your asthma, try to find someone else to do these chores.  Floors in your house should be wood, tile, or vinyl. Carpet can trap dander and dust.  Use allergy-proof pillows, mattress covers, and box spring covers.  Wash bedsheets and blankets every week in hot water and dry in a dryer.  Use a blanket that is made of polyester or cotton with a tight nap.  Do not use a dust ruffle on your bed.  Clean bathrooms and kitchens with bleach and repaint with mold-resistant paint.  Wash hands frequently.  Talk to your caregiver about an action plan for managing asthma attacks. This includes the use of a peak flow meter which measures the severity of the attack and medicines that can help stop the attack. An action plan can help minimize or stop the attack without having to seek medical care.  Remain calm during an asthma attack.  Always have a plan prepared for seeking medical attention. This should include contacting your caregiver and in the case of a severe attack, calling your local emergency services (911 in [place] SEEK MEDICAL CARE IF:   You have wheezing, shortness of breath, or a cough even if taking medicine to prevent attacks.  You have thickening of sputum.  Your sputum changes from clear or white to yellow, green, gray, or bloody.  You have any problems that may be related to the medicines you are taking (such as a rash,  itching, swelling, or trouble breathing).  You are using a reliever medicine more than 2 3 times per week.  Your peak flow is still at 50 79% of personal best after following your action plan for 1 hour. SEEK IMMEDIATE MEDICAL CARE IF:   You are short of breath even at rest.  You get short of breath when doing very little physical activity.  You have difficulty eating, drinking, or talking due to asthma symptoms.  You have chest pain or you feel that your heart is beating fast.  You have a bluish color to your  lips or fingernails.  You are lightheaded, dizzy, or faint.  You have a fever or persistent symptoms for more than 2 3 days.  You have a fever and symptoms suddenly get worse.  You seem to be getting worse and are unresponsive to treatment during an asthma attack.  Your peak flow is less than 50% of personal best. MAKE SURE YOU:   Understand these instructions.  Will watch your condition.  Will get help right away if you are not doing well or get worse. Document Released: 05/26/2005 Document Revised: 05/12/2012 Document Reviewed: 01/12/2008 [facility] Patient Information 2014 [place], [place].

## 2013-04-12 ENCOUNTER — Ambulatory Visit (INDEPENDENT_AMBULATORY_CARE_PROVIDER_SITE_OTHER): Payer: 59 | Admitting: Family Medicine

## 2013-04-12 VITALS — BP 100/60 | HR 78 | Temp 97.5°F | Resp 16 | Ht 62.5 in | Wt 150.0 lb

## 2013-04-12 DIAGNOSIS — R42 Dizziness and giddiness: Secondary | ICD-10-CM

## 2013-04-12 DIAGNOSIS — T753XXA Motion sickness, initial encounter: Secondary | ICD-10-CM

## 2013-04-12 MED ORDER — DIAZEPAM 2 MG PO TABS: ORAL_TABLET | ORAL | Status: DC

## 2013-04-12 MED ORDER — MECLIZINE HCL 25 MG PO TABS: 25.0000 mg | ORAL_TABLET | Freq: Three times a day (TID) | ORAL | Status: DC | PRN

## 2013-04-12 MED ORDER — ONDANSETRON 4 MG PO TBDP: ORAL_TABLET | ORAL | Status: DC

## 2013-04-12 NOTE — Progress Notes (Signed)
Subjective: 52 year old lady who has she wore the scopolamine patches, but despite this had a lot of motion sickness sensation while on the ship. Flying back was terrible for her. Since Sunday she has persisted with room motion dizziness and staggering despite being back home and on stable ground. She is too dizzy to feel safe driving. She's not going to work. She takes a single antihistamine tablet, no other regular meds her medications.  Although she has felt nauseous she has not vomited.  Objective: Pleasant alert healthy appearing lady. Her TMs are normal. Eyes PERRLA. EOMs intact. Fundi benign with discs sharp. Throat clear. No carotid bruits. Chest clear to auscultation. Heart regular without murmurs. She is unstable in her gait.  Assessment: Vertigo secondary to prolonged motion sickness Nausea  Plan: Treat symptomatically with meclizine and/or diazepam for the dizziness. Zofran to use for the nausea. Hopefully this will stabilize in the next few days.

## 2013-04-12 NOTE — Patient Instructions (Signed)
Take the meclizine one half to one tablet every 6 hours as needed for dizziness. Hold the Claritin while you are taking meclizine.  Take the Zofran if needed for nausea  Take diazepam (Valium) one every 6 hours when needed for dizziness  Drink lots of water  Return if acutely worse

## 2013-04-13 ENCOUNTER — Telehealth: Payer: Self-pay

## 2013-04-13 NOTE — Telephone Encounter (Signed)
Patient states that she was seen for vertigo on 04/12/2013 however she does not feel well enough to drive to work. Still feeling dizzy. Would like to know if she can get an extension on being out of work until Friday. Wants it faxed [phone]  [phone]

## 2013-04-13 NOTE — Telephone Encounter (Signed)
This is done, tried to call patient, but her voice mail kept repeating, could not leave message.

## 2013-04-14 ENCOUNTER — Other Ambulatory Visit: Payer: Self-pay

## 2013-04-14 ENCOUNTER — Ambulatory Visit (INDEPENDENT_AMBULATORY_CARE_PROVIDER_SITE_OTHER): Payer: 59 | Admitting: Family Medicine

## 2013-04-14 VITALS — BP 122/82 | HR 78 | Temp 98.3°F | Resp 16 | Ht 62.0 in | Wt 150.0 lb

## 2013-04-14 DIAGNOSIS — J019 Acute sinusitis, unspecified: Secondary | ICD-10-CM

## 2013-04-14 DIAGNOSIS — Z5189 Encounter for other specified aftercare: Secondary | ICD-10-CM

## 2013-04-14 DIAGNOSIS — H9202 Otalgia, left ear: Secondary | ICD-10-CM

## 2013-04-14 DIAGNOSIS — H9209 Otalgia, unspecified ear: Secondary | ICD-10-CM

## 2013-04-14 DIAGNOSIS — T753XXA Motion sickness, initial encounter: Secondary | ICD-10-CM

## 2013-04-14 DIAGNOSIS — T753XXD Motion sickness, subsequent encounter: Secondary | ICD-10-CM

## 2013-04-14 MED ORDER — SCOPOLAMINE 1 MG/3DAYS TD PT72: 1.0000 | MEDICATED_PATCH | TRANSDERMAL | Status: DC

## 2013-04-14 MED ORDER — AZITHROMYCIN 250 MG PO TABS: ORAL_TABLET | ORAL | Status: DC

## 2013-04-14 MED ORDER — MECLIZINE HCL 25 MG PO TABS: 25.0000 mg | ORAL_TABLET | Freq: Three times a day (TID) | ORAL | Status: DC | PRN

## 2013-04-14 NOTE — Patient Instructions (Signed)
Motion Sickness Motion sickness is an unpleasant, temporary feeling of dizziness, nausea, and vomiting that occurs when a person is traveling. It can occur during travel by boat, car, airplane, or even on an amusement park ride. The symptoms of motion sickness usually get better once the motion or traveling stops, but problems may persist for hours or days. CAUSES  Motion of the body can cause fluid changes in your inner ear, which can result in motion sickness. Some people are more susceptible to motion sickness than others. Stress, other illnesses, or drinking too much alcohol may add to motion sickness. SYMPTOMS   Nausea.  Dizziness.  Unsteadiness when walking.  Vomiting. DIAGNOSIS  Motion sickness is diagnosed based on your symptoms while you are traveling or moving. TREATMENT  Most people improve rapidly after the motion stops. There are also over-the-counter medicines that can help with motion sickness. Your caregiver can prescribe motion sickness patches. These patches are placed behind your ear. PREVENTION   Avoid situations that cause your motion sickness, if possible.  Consider taking medicines such as meclizine or dimenhydrinate before going on a trip that may cause motion sickness.  Do not eat large meals or drink alcohol before or during travel.  Take small, frequent sips of liquids as needed.  Sit in an area of the airplane or boat with the least motion. On an airplane, sit near the wing. Lie back in your seat, if possible. When riding in a car, try to avoid sitting in the backseat.  Breathe slowly and deeply.  Do not read or focus on nearby objects, if possible, especially if the water or air is rough. However, watching the horizon or a distant object is sometimes helpful, especially in a boat.  Avoid areas where people are smoking, if possible.  Plan ahead for vacations. Your caregiver can help you with a prescription or suggestions for over-the-counter  medicines. HOME CARE INSTRUCTIONS   Only take over-the-counter or prescription medicines as directed by your caregiver.  If you use a motion sickness patch, wash your hands after you put the patch on. Touching your hands to your eyes after using the patch can enlarge (dilate) your pupils for 1 to 2 days and disturb your vision. SEEK MEDICAL CARE IF:   Your vomiting or nausea cannot be controlled with medicine and rest.  You notice blood in your vomit. This could be dark red or look like coffee grounds.  You faint or have severe dizziness or lightheadedness upon standing. These may be signs of dehydration.  You have a fever.  You have severe abdominal or chest pain.  You have trouble breathing.  You have a severe headache.  You develop weakness or numbness on one side of the body.  You have trouble speaking. MAKE SURE YOU:   Understand these instructions.  Will watch your condition.  Will get help right away if you are not doing well or get worse. Document Released: 05/26/2005 Document Revised: 08/18/2011 Document Reviewed: 12/24/2010 [facility] Patient Information 2014 [place], [place]. Sinusitis Sinusitis is redness, soreness, and swelling (inflammation) of the paranasal sinuses. Paranasal sinuses are air pockets within the bones of your face (beneath the eyes, the middle of the forehead, or above the eyes). In healthy paranasal sinuses, mucus is able to drain out, and air is able to circulate through them by way of your nose. However, when your paranasal sinuses are inflamed, mucus and air can become trapped. This can allow bacteria and other germs to grow and cause infection.  Sinusitis can develop quickly and last only a short time (acute) or continue over a long period (chronic). Sinusitis that lasts for more than 12 weeks is considered chronic.  CAUSES  Causes of sinusitis include:  Allergies.  Structural abnormalities, such as displacement of the cartilage that separates  your nostrils (deviated septum), which can decrease the air flow through your nose and sinuses and affect sinus drainage.  Functional abnormalities, such as when the small hairs (cilia) that line your sinuses and help remove mucus do not work properly or are not present. SYMPTOMS  Symptoms of acute and chronic sinusitis are the same. The primary symptoms are pain and pressure around the affected sinuses. Other symptoms include:  Upper toothache.  Earache.  Headache.  Bad breath.  Decreased sense of smell and taste.  A cough, which worsens when you are lying flat.  Fatigue.  Fever.  Thick drainage from your nose, which often is green and may contain pus (purulent).  Swelling and warmth over the affected sinuses. DIAGNOSIS  Your caregiver will perform a physical exam. During the exam, your caregiver may:  Look in your nose for signs of abnormal growths in your nostrils (nasal polyps).  Tap over the affected sinus to check for signs of infection.  View the inside of your sinuses (endoscopy) with a special imaging device with a light attached (endoscope), which is inserted into your sinuses. If your caregiver suspects that you have chronic sinusitis, one or more of the following tests may be recommended:  Allergy tests.  Nasal culture A sample of mucus is taken from your nose and sent to a lab and screened for bacteria.  Nasal cytology A sample of mucus is taken from your nose and examined by your caregiver to determine if your sinusitis is related to an allergy. TREATMENT  Most cases of acute sinusitis are related to a viral infection and will resolve on their own within 10 days. Sometimes medicines are prescribed to help relieve symptoms (pain medicine, decongestants, nasal steroid sprays, or saline sprays).  However, for sinusitis related to a bacterial infection, your caregiver will prescribe antibiotic medicines. These are medicines that will help kill the bacteria causing  the infection.  Rarely, sinusitis is caused by a fungal infection. In theses cases, your caregiver will prescribe antifungal medicine. For some cases of chronic sinusitis, surgery is needed. Generally, these are cases in which sinusitis recurs more than 3 times per year, despite other treatments. HOME CARE INSTRUCTIONS   Drink plenty of water. Water helps thin the mucus so your sinuses can drain more easily.  Use a humidifier.  Inhale steam 3 to 4 times a day (for example, sit in the bathroom with the shower running).  Apply a warm, moist washcloth to your face 3 to 4 times a day, or as directed by your caregiver.  Use saline nasal sprays to help moisten and clean your sinuses.  Take over-the-counter or prescription medicines for pain, discomfort, or fever only as directed by your caregiver. SEEK IMMEDIATE MEDICAL CARE IF:  You have increasing pain or severe headaches.  You have nausea, vomiting, or drowsiness.  You have swelling around your face.  You have vision problems.  You have a stiff neck.  You have difficulty breathing. MAKE SURE YOU:   Understand these instructions.  Will watch your condition.  Will get help right away if you are not doing well or get worse. Document Released: 05/26/2005 Document Revised: 08/18/2011 Document Reviewed: 06/10/2011 [employer] Patient Information 2014 [place].  

## 2013-04-14 NOTE — Progress Notes (Signed)
[facility]:  Office Visit  Chief Complaint:  Chief Complaint  Patient presents with  . Follow-up    Dizziness from yesterday--still very dizzy and now having left otalgia    HPI: Melanie [name] is a 52 y.o. female who is here for vertigo and nausea. She has had otalgia also. He came back from a cruise on Monday and her symptoms have gotten worse. She has had some mild abdominal pain. No vomiting. She thought she may have motion sickness from the cruise but she is not getting better. She went on [place]. She states that she went with her sister and her sister has gotten sick as well with vomiting and diarrhea. She thinks maybe she picked up a virus on the cruise ship.  She also has had chills and also sinus pressure, she has left ear pain. Sister is sick with URI sxs.  No CP/palpitations, vision changes.   Past Medical History  Diagnosis Date  . Allergy   . Asthma   . Osteoporosis    Past Surgical History  Procedure Laterality Date  . Appendectomy     History   Social History  . Marital Status: Divorced    Spouse Name: N/A    Number of Children: N/A  . Years of Education: N/A   Social History Main Topics  . Smoking status: Never Smoker   . Smokeless tobacco: None  . Alcohol Use: Yes     Comment: 2/wk  . Drug Use: No  . Sexual Activity: None   Other Topics Concern  . None   Social History Narrative  . None   Family History  Problem Relation Age of Onset  . Heart disease Father   . Irregular heart beat Mother   . Stroke Maternal Grandmother   . Heart attack Maternal Grandfather   . Stroke Paternal Grandmother   . Heart attack Paternal Grandfather   . Heart attack Brother    Allergies  Allergen Reactions  . Penicillins   . Sulfonamide Derivatives    Prior to Admission medications   Medication Sig Start Date End Date Taking? Authorizing Provider  albuterol (PROVENTIL HFA;VENTOLIN HFA) 108 (90 BASE) MCG/ACT inhaler Inhale 2  puffs into the lungs every 4 (four) hours as needed for wheezing (cough, shortness of breath or wheezing.). 09/03/12 09/03/13 Yes Ryan M Dunn, PA-C  beclomethasone (QVAR) 40 MCG/ACT inhaler Inhale 1 puff into the lungs 2 (two) times daily. 03/12/13  Yes Sherren Mocha, MD  chlorpheniramine-HYDROcodone [facility] PENNKINETIC ER) 10-8 MG/5ML LQCR Take 5 mLs by mouth every 12 (twelve) hours as needed (cough). 09/03/12  Yes Ryan M Dunn, PA-C  cyclobenzaprine (FLEXERIL) 5 MG tablet Take 1 tablet (5 mg total) by mouth 3 (three) times daily as needed for muscle spasms. 11/26/12  Yes Judi Saa, DO  fluticasone (FLONASE) 50 MCG/ACT nasal spray Place 2 sprays into the nose daily. 09/28/12  Yes Rodolph Bong, MD  ipratropium (ATROVENT HFA) 17 MCG/ACT inhaler Inhale 2 puffs into the lungs every 6 (six) hours. 09/03/12 09/03/13 Yes Ryan M Dunn, PA-C  loratadine (CLARITIN) 10 MG tablet Take 10 mg by mouth daily.   Yes Historical Provider, MD  meclizine (ANTIVERT) 25 MG tablet Take 1 tablet (25 mg total) by mouth 3 (three) times daily as needed. 04/12/13  Yes Peyton Najjar, MD  ondansetron (ZOFRAN ODT) 4 MG disintegrating tablet Use one every 6 hours if needed for nausea 04/12/13  Yes Peyton Najjar, MD  promethazine (PHENERGAN) 25 MG tablet Take 1 tablet (25 mg total) by mouth every 8 (eight) hours as needed for nausea. 11/08/12  Yes Ryan M Dunn, PA-C  zolmitriptan (ZOMIG) 5 MG tablet Take 1 tab po prn migraine. May repeat in 2 hours x 1 in HA recurs. Max 10 mg in 24 hours. 11/08/12  Yes Ryan M Dunn, PA-C  diazepam (VALIUM) 2 MG tablet Take one every 6 hours if needed for dizziness 04/12/13   Peyton Najjar, MD  mometasone-formoterol [facility]) 100-5 MCG/ACT AERO Inhale 2 puffs into the lungs 2 (two) times daily.    Historical Provider, MD     ROS: The patient denies  chills, night sweats, unintentional weight loss, chest pain, palpitations, wheezing, dyspnea on exertion, nausea, vomiting, abdominal pain, dysuria, hematuria,  melena, numbness, weakness, or tingling.   All other systems have been reviewed and were otherwise negative with the exception of those mentioned in the HPI and as above.    PHYSICAL EXAM: Filed Vitals:   04/14/13 1850  BP: 122/82  Pulse: 78  Temp: 98.3 F (36.8 C)  Resp: 16   Filed Vitals:   04/14/13 1850  Height: 5\' 2"  (1.575 m)  Weight: 150 lb (68.04 kg)   Body mass index is 27.43 kg/(m^2).  General: Alert, no acute distress HEENT:  Normocephalic, atraumatic, oropharynx patent. EOMI, PERRLA. + sinus tenderness bila max, Left TM nl, slightly erythematous rim, not infected, just irritated, + post auricular LAD small Cardiovascular:  Regular rate and rhythm, no rubs murmurs or gallops.  No Carotid bruits, radial pulse intact. No pedal edema.  Respiratory: Clear to auscultation bilaterally.  No wheezes, rales, or rhonchi.  No cyanosis, no use of accessory musculature GI: No organomegaly, abdomen is soft and non-tender, positive bowel sounds.  No masses. Skin: No rashes. Neurologic: Facial musculature symmetric. Psychiatric: Patient is appropriate throughout our interaction. Lymphatic: No cervical lymphadenopathy Musculoskeletal: Gait swaying due to dizziness, she is better weh she looks at one object otherwise no other neuro deficits.   LABS: Results for orders placed in visit on 03/12/13  TSH      Result Value Range   TSH 1.650  0.350 - 4.500 uIU/mL  POCT CBC      Result Value Range   WBC 5.8  4.6 - 10.2 K/uL   Lymph, poc 2.0  0.6 - 3.4   POC LYMPH PERCENT 34.9  10 - 50 %L   MID (cbc) 0.5  0 - 0.9   POC MID % 8.9  0 - 12 %M   POC Granulocyte 3.3  2 - 6.9   Granulocyte percent 56.2  37 - 80 %G   RBC 4.12  4.04 - 5.48 M/uL   Hemoglobin 12.6  12.2 - 16.2 g/dL   HCT, POC 16.1  09.6 - 47.9 %   MCV 97.7 (*) 80 - 97 fL   MCH, POC 30.6  27 - 31.2 pg   MCHC 31.3 (*) 31.8 - 35.4 g/dL   RDW, POC 04.5     Platelet Count, POC 141 (*) 142 - 424 K/uL   MPV 11.0  0 - 99.8 fL      EKG/XRAY:   Primary read interpreted by Dr. Conley Rolls at [facility].   ASSESSMENT/PLAN: Encounter Diagnoses  Name Primary?  . Acute sinusitis Yes  . Otalgia of left ear   . Motion sickness, subsequent encounter   . Motion sickness, initial encounter    She is here for recheck of vertigo sxs but she  may also have acute sinusitis which is not allowing her to get better in the normal time frame for her usual vertigo sxs.  She will take meclizine, has not done that She will take azithromycin She will try scopace if no improvement with meclizine Valium made her sick so we will dc this I have given her a note for work to be out until Monday, she should not take xyrtec if she is taking meclizine, She thought she was taking meclizine when it was really Zofran and so stopped taking her zyrtec as well F/u prn  Gross sideeffects, risk and benefits, and alternatives of medications d/w patient. Patient is aware that all medications have potential sideeffects and we are unable to predict every sideeffect or drug-drug interaction that may occur.  LE, THAO PHUONG, DO 04/14/2013 8:11 PM

## 2013-04-18 ENCOUNTER — Telehealth: Payer: Self-pay

## 2013-04-18 NOTE — Telephone Encounter (Signed)
Pt's ear infection is not any better, she would like to know what she should do. Please call [phone]

## 2013-04-18 NOTE — Telephone Encounter (Signed)
What is she taking? She is using Zpak and flonase, she states her ear is very painful, she feels unsteady. Please advise.

## 2013-04-18 NOTE — Telephone Encounter (Signed)
Spoke with patient and advised that she comes in for re-evaluation. She states she has had more facial pain and also ear pain, stopped taking meclizine since was making her drowsy, advise she can go back to the claritin if she is not taking meclizine.

## 2013-04-21 ENCOUNTER — Ambulatory Visit (INDEPENDENT_AMBULATORY_CARE_PROVIDER_SITE_OTHER): Payer: 59 | Admitting: Family Medicine

## 2013-04-21 VITALS — BP 116/82 | HR 82 | Temp 98.2°F | Resp 16 | Ht 62.0 in | Wt 150.2 lb

## 2013-04-21 DIAGNOSIS — H9202 Otalgia, left ear: Secondary | ICD-10-CM

## 2013-04-21 DIAGNOSIS — R42 Dizziness and giddiness: Secondary | ICD-10-CM

## 2013-04-21 DIAGNOSIS — H9209 Otalgia, unspecified ear: Secondary | ICD-10-CM

## 2013-04-21 DIAGNOSIS — J019 Acute sinusitis, unspecified: Secondary | ICD-10-CM

## 2013-04-21 MED ORDER — CIPROFLOXACIN-DEXAMETHASONE 0.3-0.1 % OT SUSP: 4.0000 [drp] | Freq: Two times a day (BID) | OTIC | Status: DC

## 2013-04-21 NOTE — Progress Notes (Signed)
 [facility]:  Office Visit  Chief Complaint:  Chief Complaint  Patient presents with  . Dizziness  . Follow-up    Dr Conley Rolls    HPI: Melanie [name] is a 52 y.o. female who is here for recheck of vertigo, ft ear pain is still present but better, Vertigo is better but not completely resolved. SHe still cannot drive But it is significantly better. She still has sinus pressure.   Past Medical History  Diagnosis Date  . Allergy   . Asthma   . Osteoporosis    Past Surgical History  Procedure Laterality Date  . Appendectomy     History   Social History  . Marital Status: Divorced    Spouse Name: N/A    Number of Children: N/A  . Years of Education: N/A   Social History Main Topics  . Smoking status: Never Smoker   . Smokeless tobacco: None  . Alcohol Use: Yes     Comment: 2/wk  . Drug Use: No  . Sexual Activity: None   Other Topics Concern  . None   Social History Narrative  . None   Family History  Problem Relation Age of Onset  . Heart disease Father   . Irregular heart beat Mother   . Stroke Maternal Grandmother   . Heart attack Maternal Grandfather   . Stroke Paternal Grandmother   . Heart attack Paternal Grandfather   . Heart attack Brother    Allergies  Allergen Reactions  . Penicillins   . Sulfonamide Derivatives    Prior to Admission medications   Medication Sig Start Date End Date Taking? Authorizing Provider  albuterol (PROVENTIL HFA;VENTOLIN HFA) 108 (90 BASE) MCG/ACT inhaler Inhale 2 puffs into the lungs every 4 (four) hours as needed for wheezing (cough, shortness of breath or wheezing.). 09/03/12 09/03/13 Yes Ryan M Dunn, PA-C  beclomethasone (QVAR) 40 MCG/ACT inhaler Inhale 1 puff into the lungs 2 (two) times daily. 03/12/13  Yes Sherren Mocha, MD  fluticasone (FLONASE) 50 MCG/ACT nasal spray Place 2 sprays into the nose daily. 09/28/12  Yes Rodolph Bong, MD  ipratropium (ATROVENT HFA) 17 MCG/ACT inhaler Inhale 2 puffs into the  lungs every 6 (six) hours. 09/03/12 09/03/13 Yes Ryan M Dunn, PA-C  loratadine (CLARITIN) 10 MG tablet Take 10 mg by mouth daily.   Yes Historical Provider, MD  zolmitriptan (ZOMIG) 5 MG tablet Take 1 tab po prn migraine. May repeat in 2 hours x 1 in HA recurs. Max 10 mg in 24 hours. 11/08/12  Yes Ryan M Dunn, PA-C  azithromycin (ZITHROMAX) 250 MG tablet Take 2 tabs po now then 1 tab po daily for the next 4 days 04/14/13    P , DO  chlorpheniramine-HYDROcodone (TUSSIONEX PENNKINETIC ER) 10-8 MG/5ML LQCR Take 5 mLs by mouth every 12 (twelve) hours as needed (cough). 09/03/12   Sondra Barges, PA-C  ciprofloxacin-dexamethasone (CIPRODEX) otic suspension Place 4 drops into the left ear 2 (two) times daily. 04/21/13    P , DO  cyclobenzaprine (FLEXERIL) 5 MG tablet Take 1 tablet (5 mg total) by mouth 3 (three) times daily as needed for muscle spasms. 11/26/12   Judi Saa, DO  diazepam (VALIUM) 2 MG tablet Take one every 6 hours if needed for dizziness 04/12/13   Peyton Najjar, MD  meclizine (ANTIVERT) 25 MG tablet Take 1 tablet (25 mg total) by mouth 3 (three) times daily as needed. 04/14/13    P , DO  mometasone-formoterol (DULERA) 100-5 MCG/ACT AERO Inhale 2 puffs into the lungs 2 (two) times daily.    Historical Provider, MD  ondansetron (ZOFRAN ODT) 4 MG disintegrating tablet Use one every 6 hours if needed for nausea 04/12/13   Peyton Najjar, MD  promethazine (PHENERGAN) 25 MG tablet Take 1 tablet (25 mg total) by mouth every 8 (eight) hours as needed for nausea. 11/08/12   Sondra Barges, PA-C  scopolamine (TRANSDERM-SCOP) 1.5 MG Place 1 patch (1.5 mg total) onto the skin every 3 (three) days. 04/14/13    P , DO     ROS: The patient denies fevers, chills, night sweats, unintentional weight loss, chest pain, palpitations, wheezing, dyspnea on exertion, nausea, vomiting, abdominal pain, dysuria, hematuria, melena, numbness, weakness, or tingling.   All other systems have been reviewed  and were otherwise negative with the exception of those mentioned in the HPI and as above.    PHYSICAL EXAM: Filed Vitals:   04/21/13 1934  BP: 116/82  Pulse: 82  Temp: 98.2 F (36.8 C)  Resp: 16   Filed Vitals:   04/21/13 1934  Height: 5\' 2"  (1.575 m)  Weight: 150 lb 3.2 oz (68.13 kg)   Body mass index is 27.46 kg/(m^2).  General: Alert, no acute distress HEENT:  Normocephalic, atraumatic, oropharynx patent. EOMI, PERRLA. ft TM dull but no clear e/o infection. Minimal sinus tenderss Cardiovascular:  Regular rate and rhythm, no rubs murmurs or gallops.  No Carotid bruits, radial pulse intact. No pedal edema.  Respiratory: Clear to auscultation bilaterally.  No wheezes, rales, or rhonchi.  No cyanosis, no use of accessory musculature GI: No organomegaly, abdomen is soft and non-tender, positive bowel sounds.  No masses. Skin: No rashes. Neurologic: Facial musculature symmetric. Psychiatric: Patient is appropriate throughout our interaction. Lymphatic: No cervical lymphadenopathy Musculoskeletal: Gait intact.   LABS: Results for orders placed in visit on 03/12/13  TSH      Result Value Range   TSH 1.650  0.350 - 4.500 uIU/mL  POCT CBC      Result Value Range   WBC 5.8  4.6 - 10.2 K/uL   Lymph, poc 2.0  0.6 - 3.4   POC LYMPH PERCENT 34.9  10 - 50 %L   MID (cbc) 0.5  0 - 0.9   POC MID % 8.9  0 - 12 %M   POC Granulocyte 3.3  2 - 6.9   Granulocyte percent 56.2  37 - 80 %G   RBC 4.12  4.04 - 5.48 M/uL   Hemoglobin 12.6  12.2 - 16.2 g/dL   HCT, POC 96.0  45.4 - 47.9 %   MCV 97.7 (*) 80 - 97 fL   MCH, POC 30.6  27 - 31.2 pg   MCHC 31.3 (*) 31.8 - 35.4 g/dL   RDW, POC 09.8     Platelet Count, POC 141 (*) 142 - 424 K/uL   MPV 11.0  0 - 99.8 fL     EKG/XRAY:   Primary read interpreted by Dr. Conley Rolls at [facility].   ASSESSMENT/PLAN: Encounter Diagnoses  Name Primary?  . Acute sinusitis Yes  . Vertigo   . Otalgia, left    Everything seems to be improving slowly, cont with  flonase She has been on azithromycin already and is still having some left er pain and TM is dull so will try otic drops Since she has a lot of sensitivities to meds I will try the otic drops Rx Ciprodex If no improvement then consider  omnicef, may have SEs with cipro F/u prn  Gross sideeffects, risk and benefits, and alternatives of medications d/w patient. Patient is aware that all medications have potential sideeffects and we are unable to predict every sideeffect or drug-drug interaction that may occur.  Hamilton Capri PHUONG, DO 04/21/2013 8:55 PM

## 2013-04-29 ENCOUNTER — Telehealth: Payer: Self-pay

## 2013-05-27 ENCOUNTER — Telehealth: Payer: Self-pay | Admitting: *Deleted

## 2013-05-27 ENCOUNTER — Ambulatory Visit: Payer: 59 | Admitting: Family Medicine

## 2013-05-27 NOTE — Telephone Encounter (Signed)
To[facility] Schanz Fax: [phone] From: [facility] Date/ Time: 05/27/2013 9:57 AM Taken By: Adline Potter, CSR Caller: Efraim Kaufmann Facility: not collected Patient: Melanie [name], Melanie [name] DOB: December 23, 1960 Phone: [phone] Reason for Call: See info below Regarding Appointment: Yes Appt Date: 05/27/2013 Appt Time: 2:30:00 PM Provider: Antoine Primas Reason: Cancel Appointment Details: Pt has a stomach bug. Outcome: Cancelled appointment in [facility])

## 2013-10-14 ENCOUNTER — Other Ambulatory Visit (INDEPENDENT_AMBULATORY_CARE_PROVIDER_SITE_OTHER): Payer: 59

## 2013-10-14 ENCOUNTER — Ambulatory Visit (INDEPENDENT_AMBULATORY_CARE_PROVIDER_SITE_OTHER): Payer: 59 | Admitting: Family Medicine

## 2013-10-14 ENCOUNTER — Encounter: Payer: Self-pay | Admitting: Family Medicine

## 2013-10-14 VITALS — BP 132/80 | HR 84 | Wt 150.0 lb

## 2013-10-14 DIAGNOSIS — M549 Dorsalgia, unspecified: Secondary | ICD-10-CM

## 2013-10-14 DIAGNOSIS — G5700 Lesion of sciatic nerve, unspecified lower limb: Secondary | ICD-10-CM

## 2013-10-14 DIAGNOSIS — M76899 Other specified enthesopathies of unspecified lower limb, excluding foot: Secondary | ICD-10-CM

## 2013-10-14 DIAGNOSIS — G5702 Lesion of sciatic nerve, left lower limb: Secondary | ICD-10-CM | POA: Insufficient documentation

## 2013-10-14 DIAGNOSIS — M7062 Trochanteric bursitis, left hip: Secondary | ICD-10-CM | POA: Insufficient documentation

## 2013-10-14 NOTE — Assessment & Plan Note (Signed)
Piriformis Syndrome  Using an anatomical model, reviewed with the patient the structures involved and how they related to diagnosis. The patient indicated understanding.   The patient was given a handout from Dr. Rouzier's book "The Sports Medicine Patient Advisor" describing the anatomy and rehabilitation of the following condition: Piriformis Syndrome  Also given a handout with more extensive Piriformis stretching, hip flexor and abductor strengthening, ham stretching  Rec deep massage, explained self-massage with ball RTC in 3 weeks.  

## 2013-10-14 NOTE — Assessment & Plan Note (Signed)
Injected today Icing protocol as well as the discussed over-the-counter anti-inflammatories due to patient been able to tolerate oral anti-inflammatory she states. Patient also given home exercise program and showed proper technique. Patient showed these different changes will come back again for 3 weeks for further evaluation and followup.

## 2013-10-14 NOTE — Progress Notes (Signed)
Tawana ScaleZach Smith D.O.  Sports Medicine [address] [place], [place] [address] Phone: [phone] Subjective:     CC:hip pain left and scitica  BJY:NWGNFAOZHYHPI:Subjective Melanie [name] is a 53 y.o. female coming in with complaint of hip pain on the left side. Patient does have a known history of sciatica after a fall multiple years ago. Patient states that she is having worsening problems. Patient denies any changes such as new shoes, new exercises for anything else. Patient states that the pain is starting to come or lateral as well. Patient states that this pain does radiate down the lateral aspect of her leg all the way towards her knee. Patient states it's worse after sitting a long amount of time in trying to walk. Worse with stairs as well. Patient has tried over-the-counter anti-inflammatories but causes some GI discomfort and does not notice any specific improvement. Patient has been trying stretches without any significant improvement. Denies any fevers or chills, denies any radiation of the pain past the knee and denies any significant weakness. Patient states that the pain can be so bad that if she rolls onto her left side she has severe pain that wakes her up at night. Patient was the severity of 9/10.    Past medical history, social, surgical and family history all reviewed in electronic medical record.   Review of Systems: No headache, visual changes, nausea, vomiting, diarrhea, constipation, dizziness, abdominal pain, skin rash, fevers, chills, night sweats, weight loss, swollen lymph nodes, body aches, joint swelling, muscle aches, chest pain, shortness of breath, mood changes.   Objective Blood pressure 132/80, pulse 84, weight 150 lb (68.04 kg), SpO2 99.00%.  General: No apparent distress alert and oriented x3 mood and affect normal, dressed appropriately.  HEENT: Pupils equal, extraocular movements intact  Respiratory: Patient's speak in full sentences and does not appear short of  breath  Cardiovascular: No lower extremity edema, non tender, no erythema  Skin: Warm dry intact with no signs of infection or rash on extremities or on axial skeleton.  Abdomen: Soft nontender  Neuro: Cranial nerves II through XII are intact, neurovascularly intact in all extremities with 2+ DTRs and 2+ pulses.  Lymph: No lymphadenopathy of posterior or anterior cervical chain or axillae bilaterally.  Gait normal with good balance and coordination.  MSK:  Non tender with full range of motion and good stability and symmetric strength and tone of shoulders, elbows, wrist, hip, knee and ankles bilaterally.  Back Exam:  Inspection: Unremarkable  Motion: Flexion 45 deg, Extension 45 deg, Side Bending to 45 deg bilaterally,  Rotation to 45 deg bilaterally  SLR laying: Negative  XSLR laying: Negative  Palpable tenderness: Positive pain over the left greater trochanteric area as well as the left appear for Ms. with spasm. FABER: Positive on left. Sensory change: Gross sensation intact to all lumbar and sacral dermatomes.  Reflexes: 2+ at both patellar tendons, 2+ at achilles tendons, Babinski's downgoing.  Strength at foot  Plantar-flexion: 5/5 Dorsi-flexion: 5/5 Eversion: 5/5 Inversion: 5/5  Leg strength  Quad: 5/5 Hamstring: 5/5 Hip flexor: 5/5 Hip abductors: 4/5  Gait unremarkable.  MSK US performed of: Left This study was ordered, performed, and interpreted by Terrilee FilesZach Smith D.O.  Hip: Trochanteric bursa with severe hypoechoic changes noted. Acetabular labrum visualized and without tears, displacement, or effusion in joint. Femoral neck appears unremarkable without increased power doppler signal along Cortex.  IMPRESSION:  Greater trochanteric bursitis   Procedure: Real-time Ultrasound Guided Injection of left  greater  trochanteric bursitis secondary to patient's body habitus Device: GE Logiq E  Ultrasound guided injection is preferred based studies that show increased duration,  increased effect, greater accuracy, decreased procedural pain, increased response rate, and decreased cost with ultrasound guided versus blind injection.  Verbal informed consent obtained.  Time-out conducted.  Noted no overlying erythema, induration, or other signs of local infection.  Skin prepped in a sterile fashion.  Local anesthesia: Topical Ethyl chloride.  With sterile technique and under real time ultrasound guidance:  Greater trochanteric area was visualized and patient's bursa was noted. A 22-gauge 3 inch needle was inserted and 4 cc of 0.5% Marcaine and 1 cc of Kenalog 40 mg/dL was injected. Pictures taken Completed without difficulty  Pain immediately resolved suggesting accurate placement of the medication.  Advised to call if fevers/chills, erythema, induration, drainage, or persistent bleeding.  Images permanently stored and available for review in the ultrasound unit.  Impression: Technically successful ultrasound guided injection.     Impression and Recommendations:     This case required medical decision making of moderate complexity.

## 2013-10-14 NOTE — Patient Instructions (Signed)
It is good to see you again.  Ice 20 minutes 2 times a day Vitamin D 2000 IU daily  Turmeric 500mg  twice daily  Tennis ball in back left pocket with sitting.  exercises 3 times a week one for hip and one for piriformis.  On wall heel and butt touching wall raise heel 6 inches hold 2 seconds down slow for count of 4  repeat 30  reps daily.  Com back in 3 weeks to make sure you are doing better.

## 2013-11-04 ENCOUNTER — Encounter: Payer: Self-pay | Admitting: Family Medicine

## 2013-11-04 ENCOUNTER — Ambulatory Visit (INDEPENDENT_AMBULATORY_CARE_PROVIDER_SITE_OTHER): Payer: 59 | Admitting: Family Medicine

## 2013-11-04 VITALS — BP 132/80 | HR 75 | Ht 62.0 in | Wt 147.0 lb

## 2013-11-04 DIAGNOSIS — M999 Biomechanical lesion, unspecified: Secondary | ICD-10-CM | POA: Insufficient documentation

## 2013-11-04 DIAGNOSIS — M549 Dorsalgia, unspecified: Secondary | ICD-10-CM

## 2013-11-04 DIAGNOSIS — M76899 Other specified enthesopathies of unspecified lower limb, excluding foot: Secondary | ICD-10-CM

## 2013-11-04 DIAGNOSIS — M9981 Other biomechanical lesions of cervical region: Secondary | ICD-10-CM

## 2013-11-04 DIAGNOSIS — M7062 Trochanteric bursitis, left hip: Secondary | ICD-10-CM

## 2013-11-04 NOTE — Progress Notes (Signed)
  [name] [address] [place], [place] [address] Phone: [phone] Subjective:     CC:hip pain left and scitica followup  SUP:JSRPRXYVOP Melanie [name] is a 53 y.o. female coming in with complaint of hip pain on the left side. Patient was seen previously and was diagnosed with more of a piriformis syndrome as well as a greater trochanteric bursitis. Patient was given an injection and states that she is now 95% better as she continues to do the exercises as well as for manual massage. Patient has been able to do all activities in his back in the gym and is feeling much better. Patient does have a history of chronic back pain and states that this seems to be somewhat exacerbated. Other than that has been doing much better.    Past medical history, social, surgical and family history all reviewed in electronic medical record.   Review of Systems: No headache, visual changes, nausea, vomiting, diarrhea, constipation, dizziness, abdominal pain, skin rash, fevers, chills, night sweats, weight loss, swollen lymph nodes, body aches, joint swelling, muscle aches, chest pain, shortness of breath, mood changes.   Objective Blood pressure 132/80, pulse 75, height 5\' 2"  (1.575 m), weight 147 lb (66.679 kg), SpO2 98.00%.  General: No apparent distress alert and oriented x3 mood and affect normal, dressed appropriately.  HEENT: Pupils equal, extraocular movements intact  Respiratory: Patient's speak in full sentences and does not appear short of breath  Cardiovascular: No lower extremity edema, non tender, no erythema  Skin: Warm dry intact with no signs of infection or rash on extremities or on axial skeleton.  Abdomen: Soft nontender  Neuro: Cranial nerves II through XII are intact, neurovascularly intact in all extremities with 2+ DTRs and 2+ pulses.  Lymph: No lymphadenopathy of posterior or anterior cervical chain or axillae bilaterally.  Gait normal with good  balance and coordination.  MSK:  Non tender with full range of motion and good stability and symmetric strength and tone of shoulders, elbows, wrist, hip, knee and ankles bilaterally.  Back Exam:  Inspection: Unremarkable  Motion: Flexion 45 deg, Extension 45 deg, Side Bending to 45 deg bilaterally,  Rotation to 45 deg bilaterally  SLR laying: Negative  XSLR laying: Negative  Palpable tenderness: None Tender on exam FABER: Positive on left. Sensory change: Gross sensation intact to all lumbar and sacral dermatomes.  Reflexes: 2+ at both patellar tendons, 2+ at achilles tendons, Babinski's downgoing.  Strength at foot  Plantar-flexion: 5/5 Dorsi-flexion: 5/5 Eversion: 5/5 Inversion: 5/5  Leg strength  Quad: 5/5 Hamstring: 5/5 Hip flexor: 5/5 Hip abductors: 4/5  Gait unremarkable.  Osteopathic findings Cervical C2 flexed rotated and side bent left  Thoracic T5 extended rotated and side bent left T9 and extended rotated inside that right  Lumbar L2 flexed rotated and side bent right  Sacrum Left on left   Impression and Recommendations:     This case required medical decision making of moderate complexity.

## 2013-11-04 NOTE — Assessment & Plan Note (Signed)
Decision today to treat with OMT was based on Physical Exam  After verbal consent patient was treated with HVLA, ME techniques in cervical, thoracic, lumbar and sacral areas  Patient tolerated the procedure well with improvement in symptoms  Patient given exercises, stretches and lifestyle modifications  See medications in patient instructions if given  Patient will follow up in 3 weeks    

## 2013-11-04 NOTE — Assessment & Plan Note (Signed)
Seems to be resolved at this time are very close to resolved. Patient given home exercises to work on hip abductor's and stretching the hip flexors.

## 2013-11-04 NOTE — Assessment & Plan Note (Signed)
Patient did respond very well to osteopathic manipulation today. Patient given other exercises to work on core strengthening that can be beneficial. We discussed over-the-counter medications he can be helpful as well. Patient will follow up again in 3 weeks for further evaluation.  Spent greater than 25 minutes with patient face-to-face and had greater than 50% of counseling including as described above in assessment and plan.

## 2013-11-04 NOTE — Patient Instructions (Addendum)
You are doing great overall.  Keep doing the exercises now with a 2# weight.  Do 10# for next week then go to 03-23-09 then go to 15-15-10, then all 15 Come bakck in 3 weeks for another manipulation.

## 2013-11-25 ENCOUNTER — Ambulatory Visit (INDEPENDENT_AMBULATORY_CARE_PROVIDER_SITE_OTHER): Payer: 59 | Admitting: Family Medicine

## 2013-11-25 VITALS — BP 120/80 | HR 73 | Ht 62.0 in | Wt 144.0 lb

## 2013-11-25 DIAGNOSIS — M999 Biomechanical lesion, unspecified: Secondary | ICD-10-CM

## 2013-11-25 DIAGNOSIS — M9981 Other biomechanical lesions of cervical region: Secondary | ICD-10-CM

## 2013-11-25 DIAGNOSIS — M549 Dorsalgia, unspecified: Secondary | ICD-10-CM

## 2013-11-25 MED ORDER — CYCLOBENZAPRINE HCL 5 MG PO TABS: 5.0000 mg | ORAL_TABLET | Freq: Three times a day (TID) | ORAL | Status: DC | PRN

## 2013-11-25 NOTE — Patient Instructions (Addendum)
You are so much better.  Take a flexeril tonight. I refilled it Look for 12# weights.  After working out. You need to eat a protein shake.  Whey protein isolate with almond mil with a frozen fruit.  Posture exercise! Come back in 4 weeks.

## 2013-11-26 ENCOUNTER — Encounter: Payer: Self-pay | Admitting: Family Medicine

## 2013-11-26 NOTE — Assessment & Plan Note (Signed)
Decision today to treat with OMT was based on Physical Exam  After verbal consent patient was treated with HVLA, ME techniques in cervical, thoracic, lumbar and sacral areas  Patient tolerated the procedure well with improvement in symptoms  Patient given exercises, stretches and lifestyle modifications  See medications in patient instructions if given  Patient will follow up in 4-5 weeks                                    

## 2013-11-26 NOTE — Progress Notes (Signed)
  Tawana ScaleZach Smith D.O. [facility] [address] [place], [place] [address] Phone: [phone](336) [phone] Subjective:     CC:hip pain left and scitica followup  BJY:NWGNFAOZHYHPI:Subjective Melanie D Pernell [name] is a 53 y.o. female coming in with complaint of hip pain on the left side. Patient was seen previously and was diagnosed with more of a piriformis syndrome as well as a greater trochanteric bursitis. Patient also has a history of chronic back pain. Patient has had surgery for this back pain. Patient has been responding very well to home exercises, postural exercises and as well as osteopathic manipulation. Patient is here for further evaluation. Patient denies any new symptoms.    Past medical history, social, surgical and family history all reviewed in electronic medical record.   Review of Systems: No headache, visual changes, nausea, vomiting, diarrhea, constipation, dizziness, abdominal pain, skin rash, fevers, chills, night sweats, weight loss, swollen lymph nodes, body aches, joint swelling, muscle aches, chest pain, shortness of breath, mood changes.   Objective Blood pressure 120/80, pulse 73, height 5\' 2"  (1.575 m), weight 144 lb (65.318 kg), SpO2 99.00%.  General: No apparent distress alert and oriented x3 mood and affect normal, dressed appropriately.  HEENT: Pupils equal, extraocular movements intact  Respiratory: Patient's speak in full sentences and does not appear short of breath  Cardiovascular: No lower extremity edema, non tender, no erythema  Skin: Warm dry intact with no signs of infection or rash on extremities or on axial skeleton.  Abdomen: Soft nontender  Neuro: Cranial nerves II through XII are intact, neurovascularly intact in all extremities with 2+ DTRs and 2+ pulses.  Lymph: No lymphadenopathy of posterior or anterior cervical chain or axillae bilaterally.  Gait normal with good balance and coordination.  MSK:  Non tender with full range of motion and good stability and  symmetric strength and tone of shoulders, elbows, wrist, hip, knee and ankles bilaterally.  Back Exam:  Inspection: Unremarkable  Motion: Flexion 45 deg, Extension 45 deg, Side Bending to 45 deg bilaterally,  Rotation to 45 deg bilaterally  SLR laying: Negative  XSLR laying: Negative  Palpable tenderness: No tenderness on exam today FABER: Positive on left. The patient does have more range of motion and previous exam Sensory change: Gross sensation intact to all lumbar and sacral dermatomes.  Reflexes: 2+ at both patellar tendons, 2+ at achilles tendons, Babinski's downgoing.  Strength at foot  Plantar-flexion: 5/5 Dorsi-flexion: 5/5 Eversion: 5/5 Inversion: 5/5  Leg strength  Quad: 5/5 Hamstring: 5/5 Hip flexor: 5/5 Hip abductors: 4/5  Gait unremarkable.  Osteopathic findings Cervical C2 flexed rotated and side bent left  Thoracic T5 extended rotated and side bent left T7 and extended rotated inside that right  Lumbar L2 flexed rotated and side bent right  Sacrum Left on left   Impression and Recommendations:     This case required medical decision making of moderate complexity.

## 2013-11-26 NOTE — Assessment & Plan Note (Signed)
Continues to have chronic back pain and likely will for some time. Patient did have a refill for Flexeril. Showed patient proper technique of multiple different exercises again I think will be beneficial. We discussed icing protocol at home and over-the-counter medications as well. Patient will try this and come back again in 4-5 weeks for further evaluation and treatment.

## 2013-11-29 ENCOUNTER — Encounter: Payer: Self-pay | Admitting: Family Medicine

## 2013-11-29 ENCOUNTER — Ambulatory Visit (INDEPENDENT_AMBULATORY_CARE_PROVIDER_SITE_OTHER): Payer: 59 | Admitting: Family Medicine

## 2013-11-29 ENCOUNTER — Encounter: Payer: Self-pay | Admitting: *Deleted

## 2013-11-29 VITALS — BP 126/74 | HR 78 | Ht 62.0 in | Wt 144.0 lb

## 2013-11-29 DIAGNOSIS — M999 Biomechanical lesion, unspecified: Secondary | ICD-10-CM

## 2013-11-29 DIAGNOSIS — M5380 Other specified dorsopathies, site unspecified: Secondary | ICD-10-CM

## 2013-11-29 DIAGNOSIS — G5700 Lesion of sciatic nerve, unspecified lower limb: Secondary | ICD-10-CM

## 2013-11-29 DIAGNOSIS — G5702 Lesion of sciatic nerve, left lower limb: Secondary | ICD-10-CM

## 2013-11-29 DIAGNOSIS — M62830 Muscle spasm of back: Secondary | ICD-10-CM

## 2013-11-29 MED ORDER — METHYLPREDNISOLONE ACETATE 80 MG/ML IJ SUSP
80.0000 mg | Freq: Once | INTRAMUSCULAR | Status: AC
Start: 2013-11-29 — End: 2013-11-29
  Administered 2013-11-29: 80 mg via INTRAMUSCULAR

## 2013-11-29 MED ORDER — KETOROLAC TROMETHAMINE 60 MG/2ML IM SOLN
60.0000 mg | Freq: Once | INTRAMUSCULAR | Status: AC
  Administered 2013-11-29: 60 mg via INTRAMUSCULAR

## 2013-11-29 NOTE — Assessment & Plan Note (Signed)
Patient is having a good amount of spasm today.  Patient was given a shot of Toradol as well as Depo-Medrol. Patient given home exercises and will stay out of work for today. Patient will come back in 7-10 days for further evaluation and treatment. Patient was given a trial of anti-inflammatories a try as well.  Spent greater than 25 minutes with patient face-to-face and had greater than 50% of counseling including as described above in assessment and plan.

## 2013-11-29 NOTE — Progress Notes (Signed)
  Tawana ScaleZach Smith D.O. [facility] [address] [place], [place] [address] Phone: [phone](336) [phone] Subjective:     CC:hip pain left and scitica followup  BJY:NWGNFAOZHYHPI:Subjective Dystany D Melanie [name] is a 53 y.o. female coming in with complaint of hip pain on the left side. Patient was seen previously and was diagnosed with more of a piriformis syndrome as well as a greater trochanteric bursitis. Patient was seen 4 days ago for manipulation. Patient states Sunday after exercise he started having some mild discomfort. Patient states over the course last 2 days he started having worsening worsening pain and lower back. Patient had to call out of work secondary to pain. Patient has had a history of pain and piriformis syndrome. Patient states that the pain is a localized does not have any radiation down the leg.    Past medical history, social, surgical and family history all reviewed in electronic medical record.   Review of Systems: No headache, visual changes, nausea, vomiting, diarrhea, constipation, dizziness, abdominal pain, skin rash, fevers, chills, night sweats, weight loss, swollen lymph nodes, body aches, joint swelling, muscle aches, chest pain, shortness of breath, mood changes.   Objective There were no vitals taken for this visit.  General: No apparent distress alert and oriented x3 mood and affect normal, dressed appropriately.  HEENT: Pupils equal, extraocular movements intact  Respiratory: Patient's speak in full sentences and does not appear short of breath  Cardiovascular: No lower extremity edema, non tender, no erythema  Skin: Warm dry intact with no signs of infection or rash on extremities or on axial skeleton.  Abdomen: Soft nontender  Neuro: Cranial nerves II through XII are intact, neurovascularly intact in all extremities with 2+ DTRs and 2+ pulses.  Lymph: No lymphadenopathy of posterior or anterior cervical chain or axillae bilaterally.  Gait normal with good balance and  coordination.  MSK:  Non tender with full range of motion and good stability and symmetric strength and tone of shoulders, elbows, wrist, hip, knee and ankles bilaterally.  Back Exam:  Inspection: Unremarkable  Motion: Flexion 45 deg, Extension 45 deg, Side Bending to 45 deg bilaterally,  Rotation to 45 deg bilaterally  SLR laying: Negative  XSLR laying: Negative  Palpable tenderness: Severe tenderness in the piriformis muscle FABER: Positive on left.  Sensory change: Gross sensation intact to all lumbar and sacral dermatomes.  Reflexes: 2+ at both patellar tendons, 2+ at achilles tendons, Babinski's downgoing.  Strength at foot  Plantar-flexion: 5/5 Dorsi-flexion: 5/5 Eversion: 5/5 Inversion: 5/5  Leg strength  Quad: 5/5 Hamstring: 5/5 Hip flexor: 5/5 Hip abductors: 4/5  Gait unremarkable.  Osteopathic findings Cervical C2 flexed rotated and side bent left  Thoracic T5 extended rotated and side bent left T7 and extended rotated inside that right  Lumbar L2 flexed rotated and side bent right L4 flexed rotated and side bent right Patient does have significant paraspinal musculature spasm of the left side of the lumbar spine. Sacrum Left on left   spasm of appear for this noted as well. Impression and Recommendations:     This case required medical decision making of moderate complexity.

## 2013-11-29 NOTE — Assessment & Plan Note (Signed)
Decision today to treat with OMT was based on Physical Exam  After verbal consent patient was treated with HVLA, ME techniques in cervical, thoracic, lumbar and sacral areas  Patient tolerated the procedure well with improvement in symptoms  Patient given exercises, stretches and lifestyle modifications  See medications in patient instructions if given  Patient will follow up in  1 weeks

## 2013-11-29 NOTE — Patient Instructions (Signed)
Good to see you 2 injections today.  Tennis ball and ice are your best friends.  Try medicine 3 times daily for at least 3 days.  Stretches all the time.  Start exercises at 50% and increase 10% a day over the course of the next week starting tomorrow Come back in 7- 10 days or so.

## 2013-12-14 ENCOUNTER — Encounter: Payer: Self-pay | Admitting: Family Medicine

## 2013-12-14 ENCOUNTER — Ambulatory Visit (INDEPENDENT_AMBULATORY_CARE_PROVIDER_SITE_OTHER): Payer: 59 | Admitting: Family Medicine

## 2013-12-14 VITALS — BP 122/84 | HR 73 | Ht 62.0 in | Wt 146.0 lb

## 2013-12-14 DIAGNOSIS — M999 Biomechanical lesion, unspecified: Secondary | ICD-10-CM

## 2013-12-14 DIAGNOSIS — M9981 Other biomechanical lesions of cervical region: Secondary | ICD-10-CM

## 2013-12-14 DIAGNOSIS — G5700 Lesion of sciatic nerve, unspecified lower limb: Secondary | ICD-10-CM

## 2013-12-14 DIAGNOSIS — G5702 Lesion of sciatic nerve, left lower limb: Secondary | ICD-10-CM

## 2013-12-14 NOTE — Progress Notes (Signed)
  Melanie ScaleZach [name] D.O. [facility] [address] [place], [place] [address] Phone: [phone](336) [phone] Subjective:     CC:hip pain left and scitica followup  ZHY:QMVHQIONGEHPI:Subjective Melanie [name] is a 53 y.o. female coming in with complaint of hip pain on the left side. Patient was seen previously and was diagnosed with more of a piriformis syndrome as well as a greater trochanteric bursitis. Patient also has sacroiliac joint dysfunction. Patient states overall she has been feeling significantly better. Patient has been responding to osteopathic manipulation. Patient has been doing some more of the home exercises which has been beneficial. Patient denies any of the sciatica-type pain or any radicular symptoms since last visit. Has had some mild discomfort of the lower back as well as the neck but overall has been getting significantly better. Patient has been able to do daily activities much better. Denies any nighttime awakening    Past medical history, social, surgical and family history all reviewed in electronic medical record.   Review of Systems: No headache, visual changes, nausea, vomiting, diarrhea, constipation, dizziness, abdominal pain, skin rash, fevers, chills, night sweats, weight loss, swollen lymph nodes, body aches, joint swelling, muscle aches, chest pain, shortness of breath, mood changes.   Objective Blood pressure 122/84, pulse 73, height 5\' 2"  (1.575 m), weight 146 lb (66.225 kg), SpO2 99.00%.  General: No apparent distress alert and oriented x3 mood and affect normal, dressed appropriately.  HEENT: Pupils equal, extraocular movements intact  Respiratory: Patient's speak in full sentences and does not appear short of breath  Cardiovascular: No lower extremity edema, non tender, no erythema  Skin: Warm dry intact with no signs of infection or rash on extremities or on axial skeleton.  Abdomen: Soft nontender  Neuro: Cranial nerves II through XII are intact, neurovascularly  intact in all extremities with 2+ DTRs and 2+ pulses.  Lymph: No lymphadenopathy of posterior or anterior cervical chain or axillae bilaterally.  Gait normal with good balance and coordination.  MSK:  Non tender with full range of motion and good stability and symmetric strength and tone of shoulders, elbows, wrist, hip, knee and ankles bilaterally.  Back Exam:  Inspection: Unremarkable  Motion: Flexion 45 deg, Extension 45 deg, Side Bending to 45 deg bilaterally,  Rotation to 45 deg bilaterally  SLR laying: Negative  XSLR laying: Negative  Palpable tenderness: Only mild tenderness piriformis FABER: Positive on left.  Sensory change: Gross sensation intact to all lumbar and sacral dermatomes.  Reflexes: 2+ at both patellar tendons, 2+ at achilles tendons, Babinski's downgoing.  Strength at foot  Plantar-flexion: 5/5 Dorsi-flexion: 5/5 Eversion: 5/5 Inversion: 5/5  Leg strength  Quad: 5/5 Hamstring: 5/5 Hip flexor: 5/5 Hip abductors: 4/5  Gait unremarkable.  Osteopathic findings Cervical C2 flexed rotated and side bent left  Thoracic T5 extended rotated and side bent left   Lumbar L2 flexed rotated and side bent right L5 flexed rotated and side bent right  Sacrum Left on left    Impression and Recommendations:     This case required medical decision making of moderate complexity.

## 2013-12-14 NOTE — Patient Instructions (Signed)
You are doing great overall.  [facility]. If in 2 weeks you wanted to go to 03/23/14 Then 2 weeks after that full 15.  Try new stretch when you feel the activity.  Your core feels great Lets go 5-6 weeks.

## 2013-12-15 NOTE — Assessment & Plan Note (Signed)
Decision today to treat with OMT was based on Physical Exam  After verbal consent patient was treated with HVLA, ME techniques in cervical, thoracic, lumbar and sacral areas  Patient tolerated the procedure well with improvement in symptoms  Patient given exercises, stretches and lifestyle modifications  See medications in patient instructions if given  Patient will follow up in 5-6 weeks       

## 2013-12-15 NOTE — Assessment & Plan Note (Signed)
Patient is responding well to conservative therapy including osteopathic manipulation. Patient was given some other hip abductor exercises I think will be beneficial. The discussed continuing to work on core as well as postural changes. Patient will continue with these efforts and showed proper technique today. Patient and will come back and see me again in 5-6 weeks for further evaluation and treatment.  Spent greater than 25 minutes with patient face-to-face and had greater than 50% of counseling including as described above in assessment and plan.

## 2013-12-23 ENCOUNTER — Ambulatory Visit: Payer: 59 | Admitting: Family Medicine

## 2014-01-20 ENCOUNTER — Ambulatory Visit (INDEPENDENT_AMBULATORY_CARE_PROVIDER_SITE_OTHER): Payer: 59 | Admitting: Family Medicine

## 2014-01-20 ENCOUNTER — Encounter: Payer: Self-pay | Admitting: Family Medicine

## 2014-01-20 VITALS — BP 122/78 | HR 93 | Ht 62.0 in | Wt 146.0 lb

## 2014-01-20 DIAGNOSIS — M999 Biomechanical lesion, unspecified: Secondary | ICD-10-CM

## 2014-01-20 DIAGNOSIS — M549 Dorsalgia, unspecified: Secondary | ICD-10-CM

## 2014-01-20 DIAGNOSIS — M9981 Other biomechanical lesions of cervical region: Secondary | ICD-10-CM

## 2014-01-20 NOTE — Assessment & Plan Note (Signed)
Patient is responding extremely well to conservative therapy at this time. Patient is doing very well with the core strengthening exercises and range of motion exercises that she is doing. We discussed again about proper lifting positioning improper sleeping position be beneficial. We discussed icing protocol and the over-the-counter medication again at great length. Patient is responding well to osteopathic manipulation and we'll try to decrease the frequency and have patient followup again in 6 weeks.  Spent greater than 25 minutes with patient face-to-face and had greater than 50% of counseling including as described above in assessment and plan.

## 2014-01-20 NOTE — Assessment & Plan Note (Signed)
Decision today to treat with OMT was based on Physical Exam  After verbal consent patient was treated with HVLA, ME techniques in cervical, thoracic, lumbar and sacral areas  Patient tolerated the procedure well with improvement in symptoms  Patient given exercises, stretches and lifestyle modifications  See medications in patient instructions if given  Patient will follow up in 6 weeks      

## 2014-01-20 NOTE — Progress Notes (Signed)
Melanie [name] D.O. [facility] [address] [place], [place] [address] Phone: [phone](336) [phone] Subjective:     CC:hip pain left and scitica followup mild neck discomfort.   KGM:WNUUVOZDGUHPI:Subjective Melanie [name] is a 53 y.o. female coming in with complaint of hip pain on the left side. Patient was seen previously and was diagnosed with more of a piriformis syndrome as well as a greater trochanteric bursitis. Patient states that yesterday she did not have any significant pain in her hip. Patient states that now she is doing much better. Patient does have some mild dull aching pain here and there and some mild neck discomfort without any radicular symptoms. Patient states it is likely from her position at work. States that she has not been able to to get it to cooperate with her regular exercises. Patient continues to lift for 5 times weekly. Able to do all activities of daily living and sleeping fairly comfortable. Continues to work on sleeping position.    Past medical history, social, surgical and family history all reviewed in electronic medical record.   Review of Systems: No headache, visual changes, nausea, vomiting, diarrhea, constipation, dizziness, abdominal pain, skin rash, fevers, chills, night sweats, weight loss, swollen lymph nodes, body aches, joint swelling, muscle aches, chest pain, shortness of breath, mood changes.   Objective Blood pressure 122/78, pulse 93, height 5\' 2"  (1.575 m), weight 146 lb (66.225 kg), SpO2 99.00%.  General: No apparent distress alert and oriented x3 mood and affect normal, dressed appropriately.  HEENT: Pupils equal, extraocular movements intact  Respiratory: Patient's speak in full sentences and does not appear short of breath  Cardiovascular: No lower extremity edema, non tender, no erythema  Skin: Warm dry intact with no signs of infection or rash on extremities or on axial skeleton.  Abdomen: Soft nontender  Neuro: Cranial nerves II through  XII are intact, neurovascularly intact in all extremities with 2+ DTRs and 2+ pulses.  Lymph: No lymphadenopathy of posterior or anterior cervical chain or axillae bilaterally.  Gait normal with good balance and coordination.  MSK:  Non tender with full range of motion and good stability and symmetric strength and tone of shoulders, elbows, wrist, hip, knee and ankles bilaterally.  Neck: Inspection unremarkable. No palpable stepoffs. Negative Spurling's maneuver. Full neck range of motion Grip strength and sensation normal in bilateral hands Strength good C4 to T1 distribution No sensory change to C4 to T1 Negative Hoffman sign bilaterally Reflexes normal Back Exam:  Inspection: Unremarkable  Motion: Flexion 45 deg, Extension 45 deg, Side Bending to 45 deg bilaterally,  Rotation to 45 deg bilaterally  SLR laying: Negative  XSLR laying: Negative  Palpable tenderness: Only mild tenderness piriformis FABER: Positive on left.  Sensory change: Gross sensation intact to all lumbar and sacral dermatomes.  Reflexes: 2+ at both patellar tendons, 2+ at achilles tendons, Babinski's downgoing.  Strength at foot  Plantar-flexion: 5/5 Dorsi-flexion: 5/5 Eversion: 5/5 Inversion: 5/5  Leg strength  Quad: 5/5 Hamstring: 5/5 Hip flexor: 5/5 Hip abductors: 4/5  Gait unremarkable.  Osteopathic findings Cervical C2 flexed rotated and side bent left C4 flexed rotated and side bent right  Thoracic T5 extended rotated and side bent left elevated rib T3 extended rotated and side bent right  Lumbar L2 flexed rotated and side bent right L5 flexed rotated and side bent right  Sacrum Left on left    Impression and Recommendations:     This case required medical decision making of  moderate complexity.

## 2014-01-20 NOTE — Patient Instructions (Signed)
You are amazing!!! Continue to work with the sleeping position.  Ice is your friend With lifting think of bringing shoulder back a little. Should be in line with ears.  With tennisball on rib lift a 5 or 8 lbs weight in hand with straight arm.  Lay on back with tennisball then lift weight with straight arm and try to locate the tennisball under the arm then take deep breath until pop occurs.  Lets say 6 weeks

## 2014-03-01 ENCOUNTER — Ambulatory Visit (INDEPENDENT_AMBULATORY_CARE_PROVIDER_SITE_OTHER): Payer: 59 | Admitting: Family Medicine

## 2014-03-01 ENCOUNTER — Encounter: Payer: Self-pay | Admitting: Family Medicine

## 2014-03-01 VITALS — BP 128/80 | HR 65 | Ht 61.5 in | Wt 147.0 lb

## 2014-03-01 DIAGNOSIS — M999 Biomechanical lesion, unspecified: Secondary | ICD-10-CM

## 2014-03-01 DIAGNOSIS — M549 Dorsalgia, unspecified: Secondary | ICD-10-CM

## 2014-03-01 DIAGNOSIS — M9981 Other biomechanical lesions of cervical region: Secondary | ICD-10-CM

## 2014-03-01 MED ORDER — DICLOFENAC SODIUM 2 % TD SOLN
2.0000 | Freq: Two times a day (BID) | TRANSDERMAL | Status: DC
Start: 2014-03-01 — End: 2014-04-14

## 2014-03-01 NOTE — Patient Instructions (Signed)
You are doing great When lower back hurts take the knee on the side that hurts and pull toward opposite shoulder.  Continue the other exercises I love the water aerobics.  Lets go for 6 weeks.

## 2014-03-01 NOTE — Assessment & Plan Note (Signed)
Patient is unremarkable he better at this time. She will continue to come see me on a fairly regular basis but will increase duration between visits to 6 weeks. Patient also given a topical anti-inflammatory prescription today to help with the aches and pains in the interim. We discussed continuing at home exercises on a fairly regular basis. We discussed the icing protocol as well. We discussed different ways to avoid significant tightness after activity. Patient will come back and see me again in 6 weeks for further evaluation and treatment.

## 2014-03-01 NOTE — Progress Notes (Signed)
     [name] [address] [place], [place] [address] Phone: [phone] Subjective:     CC:hip pain left and scitica followup mild neck discomfort.   BJY:NWGNFAOZHY Melanie [name] is a 53 y.o. female coming in with complaint of hip pain on the left side. Patient was seen previously and was diagnosed with more of a piriformis syndrome as well as a greater trochanteric bursitis. Patient though has been doing significantly better. Patient has been seeing me for 6 weeks. Patient has changed her workout routine to be doing more of a water aerobics and has noticed less stress on her joints. Patient though is having some mild increase in neck discomfort at this time. Patient denies any radiation of the arm and states that of low back pain has been relatively stable. Just a dull aching sensation overall. Denies any new symptoms.    Past medical history, social, surgical and family history all reviewed in electronic medical record.   Review of Systems: No headache, visual changes, nausea, vomiting, diarrhea, constipation, dizziness, abdominal pain, skin rash, fevers, chills, night sweats, weight loss, swollen lymph nodes, body aches, joint swelling, muscle aches, chest pain, shortness of breath, mood changes.   Objective Blood pressure 128/80, pulse 65, height 5' 1.5" (1.562 m), weight 147 lb (66.679 kg).  General: No apparent distress alert and oriented x3 mood and affect normal, dressed appropriately.  HEENT: Pupils equal, extraocular movements intact  Respiratory: Patient's speak in full sentences and does not appear short of breath  Cardiovascular: No lower extremity edema, non tender, no erythema  Skin: Warm dry intact with no signs of infection or rash on extremities or on axial skeleton.  Abdomen: Soft nontender  Neuro: Cranial nerves II through XII are intact, neurovascularly intact in all extremities with 2+ DTRs and 2+ pulses.  Lymph: No lymphadenopathy  of posterior or anterior cervical chain or axillae bilaterally.  Gait normal with good balance and coordination.  MSK:  Non tender with full range of motion and good stability and symmetric strength and tone of shoulders, elbows, wrist, hip, knee and ankles bilaterally.  Neck: Inspection unremarkable. No palpable stepoffs. Negative Spurling's maneuver. Full neck range of motion Grip strength and sensation normal in bilateral hands Strength good C4 to T1 distribution No sensory change to C4 to T1 Negative Hoffman sign bilaterally Reflexes normal Back Exam:  Inspection: Unremarkable  Motion: Flexion 45 deg, Extension 45 deg, Side Bending to 45 deg bilaterally,  Rotation to 45 deg bilaterally  SLR laying: Negative  XSLR laying: Negative  Palpable tenderness: Only mild tenderness piriformis FABER: Positive on left.  Sensory change: Gross sensation intact to all lumbar and sacral dermatomes.  Reflexes: 2+ at both patellar tendons, 2+ at achilles tendons, Babinski's downgoing.  Strength at foot  Plantar-flexion: 5/5 Dorsi-flexion: 5/5 Eversion: 5/5 Inversion: 5/5  Leg strength  Quad: 5/5 Hamstring: 5/5 Hip flexor: 5/5 Hip abductors: 4/5  Gait unremarkable. No Change from previous exam  Osteopathic findings Cervical C2 flexed rotated and side bent left C4 flexed rotated and side bent right  Thoracic T5 extended rotated and side bent left elevated rib T3 extended rotated and side bent right  Lumbar L2 flexed rotated and side bent right L5 flexed rotated and side bent right  Sacrum Left on left    Impression and Recommendations:     This case required medical decision making of moderate complexity.

## 2014-03-01 NOTE — Assessment & Plan Note (Signed)
Decision today to treat with OMT was based on Physical Exam  After verbal consent patient was treated with HVLA, ME techniques in cervical, thoracic, lumbar and sacral areas  Patient tolerated the procedure well with improvement in symptoms  Patient given exercises, stretches and lifestyle modifications  See medications in patient instructions if given  Patient will follow up in 6 weeks      

## 2014-03-03 ENCOUNTER — Ambulatory Visit: Payer: 59 | Admitting: Family Medicine

## 2014-04-14 ENCOUNTER — Encounter: Payer: Self-pay | Admitting: Family Medicine

## 2014-04-14 ENCOUNTER — Ambulatory Visit (INDEPENDENT_AMBULATORY_CARE_PROVIDER_SITE_OTHER): Payer: 59 | Admitting: Family Medicine

## 2014-04-14 VITALS — BP 118/72 | HR 84 | Ht 62.0 in | Wt 146.0 lb

## 2014-04-14 DIAGNOSIS — M9902 Segmental and somatic dysfunction of thoracic region: Secondary | ICD-10-CM

## 2014-04-14 DIAGNOSIS — M546 Pain in thoracic spine: Secondary | ICD-10-CM

## 2014-04-14 DIAGNOSIS — M9903 Segmental and somatic dysfunction of lumbar region: Secondary | ICD-10-CM

## 2014-04-14 DIAGNOSIS — M9901 Segmental and somatic dysfunction of cervical region: Secondary | ICD-10-CM

## 2014-04-14 DIAGNOSIS — M999 Biomechanical lesion, unspecified: Secondary | ICD-10-CM

## 2014-04-14 NOTE — Assessment & Plan Note (Signed)
Decision today to treat with OMT was based on Physical Exam  After verbal consent patient was treated with HVLA, ME techniques in cervical, thoracic, lumbar and sacral areas  Patient tolerated the procedure well with improvement in symptoms  Patient given exercises, stretches and lifestyle modifications  See medications in patient instructions if given  Patient will follow up in 6 weeks      

## 2014-04-14 NOTE — Progress Notes (Signed)
     [facility] [address] [place], [place] [address] Phone: [phone] Subjective:     CC:hip pain left and scitica followup mild neck discomfort.   BJY:NWGNFAOZHYHPI:Subjective Melanie [name] is a 53 y.o. female coming in with complaint of hip pain on the left side. Patient was seen previously and was diagnosed with more of a piriformis syndrome as well as a greater trochanteric bursitis.  Patient has had chronic back pain before as well as chronic neck pain. Patient states that she has responded very well to osteopathic manipulation. Patient has started swimming on a much more regular basis and is feeling much better. Patient states that she is not having any pain going down the side of her leg or her back. Patient states that overall she has made significant strides. Patient is also losing weight.patient feels better overall. Patient is not taking any muscle relaxers at this time and is only taking over-the-counter supplements.     Past medical history, social, surgical and family history all reviewed in electronic medical record.   Review of Systems: No headache, visual changes, nausea, vomiting, diarrhea, constipation, dizziness, abdominal pain, skin rash, fevers, chills, night sweats, weight loss, swollen lymph nodes, body aches, joint swelling, muscle aches, chest pain, shortness of breath, mood changes.   Objective Blood pressure 118/72, pulse 84, height 5\' 2"  (1.575 m), weight 146 lb (66.225 kg), SpO2 99 %.  General: No apparent distress alert and oriented x3 mood and affect normal, dressed appropriately.  HEENT: Pupils equal, extraocular movements intact  Respiratory: Patient's speak in full sentences and does not appear short of breath  Cardiovascular: No lower extremity edema, non tender, no erythema  Skin: Warm dry intact with no signs of infection or rash on extremities or on axial skeleton.  Abdomen: Soft nontender  Neuro: Cranial nerves II through  XII are intact, neurovascularly intact in all extremities with 2+ DTRs and 2+ pulses.  Lymph: No lymphadenopathy of posterior or anterior cervical chain or axillae bilaterally.  Gait normal with good balance and coordination.  MSK:  Non tender with full range of motion and good stability and symmetric strength and tone of shoulders, elbows, wrist, hip, knee and ankles bilaterally.  Neck: Inspection unremarkable. No palpable stepoffs. Negative Spurling's maneuver. Full neck range of motion Grip strength and sensation normal in bilateral hands Strength good C4 to T1 distribution No sensory change to C4 to T1 Negative Hoffman sign bilaterally Reflexes normal Back Exam:  Inspection: Unremarkable  Motion: Flexion 45 deg, Extension 45 deg, Side Bending to 45 deg bilaterally,  Rotation to 45 deg bilaterally  SLR laying: Negative  XSLR laying: Negative  Palpable tenderness: Only mild tenderness piriformis FABER: negative with increasing range of motion Sensory change: Gross sensation intact to all lumbar and sacral dermatomes.  Reflexes: 2+ at both patellar tendons, 2+ at achilles tendons, Babinski's downgoing.  Strength at foot  Plantar-flexion: 5/5 Dorsi-flexion: 5/5 Eversion: 5/5 Inversion: 5/5  Leg strength  Quad: 5/5 Hamstring: 5/5 Hip flexor: 5/5 Hip abductors: 4+/5  Gait unremarkable. Better than previous exam  Osteopathic findings Cervical C2 flexed rotated and side bent left  Thoracic T5 extended rotated and side bent left  T2 extended rotated and side bent right  Lumbar L2 flexed rotated and side bent right  Sacrum Left on left    Impression and Recommendations:     This case required medical decision making of moderate complexity.

## 2014-04-14 NOTE — Assessment & Plan Note (Signed)
Patient is unremarkable without this time and has worked on her muscle imbalances significantly. Encourage patient to continue the exercise program that she's doing as well as continuing the hip abductor exercises which will be helping with her core strength. We discussed icing protocol and I think will be also beneficial. We discussed continuing the over-the-counter medications. At this time because patient is doing so well we will not make any drastic changes in the care of her treatment. Patient and will follow up and see me again in 6 weeks for further evaluation and treatment.

## 2014-04-14 NOTE — Patient Instructions (Signed)
Good to see you Keep up the swimming The best I have seen you Don't change anything! See me again in 6 weeks.

## 2014-05-26 ENCOUNTER — Ambulatory Visit (INDEPENDENT_AMBULATORY_CARE_PROVIDER_SITE_OTHER): Payer: 59 | Admitting: Family Medicine

## 2014-05-26 ENCOUNTER — Encounter: Payer: Self-pay | Admitting: Family Medicine

## 2014-05-26 VITALS — BP 132/74 | HR 106 | Ht 62.0 in | Wt 148.0 lb

## 2014-05-26 DIAGNOSIS — M6248 Contracture of muscle, other site: Secondary | ICD-10-CM

## 2014-05-26 DIAGNOSIS — M999 Biomechanical lesion, unspecified: Secondary | ICD-10-CM

## 2014-05-26 DIAGNOSIS — G5702 Lesion of sciatic nerve, left lower limb: Secondary | ICD-10-CM

## 2014-05-26 DIAGNOSIS — M9902 Segmental and somatic dysfunction of thoracic region: Secondary | ICD-10-CM

## 2014-05-26 DIAGNOSIS — M62838 Other muscle spasm: Secondary | ICD-10-CM

## 2014-05-26 DIAGNOSIS — M9901 Segmental and somatic dysfunction of cervical region: Secondary | ICD-10-CM

## 2014-05-26 DIAGNOSIS — M9903 Segmental and somatic dysfunction of lumbar region: Secondary | ICD-10-CM

## 2014-05-26 NOTE — Progress Notes (Signed)
     [facility] [address] [place], [place] [address] Phone: [phone] Subjective:     CC:hip pain left and scitica followup mild neck discomfort.   FAO:ZHYQMVHQIOHPI:Subjective Neysa D Pernell Dupredams is a 53 y.o. female coming in with complaint of hip pain on the left side. Patient has had chronic pain of the hip and back as well as neck. Patient is responding very well to osteopathic manipulation and conservative therapies over the course of time. Patient continues to have intermittent piriformis syndrome. Patient does have to take Flexeril on occasion. Patient states overall she is doing very well. Patient has not had to take any prescription medications since last visit. Patient has taken ibuprofen 1 time. Overall is feeling significantly better. Patient is doing pool exercises 5-6 times a week and loves it. Has noticed significant increase in core strength. Some mild neck discomfort but overall feeling relatively well.   Past medical history, social, surgical and family history all reviewed in electronic medical record.   Review of Systems: No headache, visual changes, nausea, vomiting, diarrhea, constipation, dizziness, abdominal pain, skin rash, fevers, chills, night sweats, weight loss, swollen lymph nodes, body aches, joint swelling, muscle aches, chest pain, shortness of breath, mood changes.   Objective Blood pressure 132/74, pulse 106, height 5\' 2"  (1.575 m), weight 148 lb (67.132 kg), SpO2 99 %.  General: No apparent distress alert and oriented x3 mood and affect normal, dressed appropriately.  HEENT: Pupils equal, extraocular movements intact  Respiratory: Patient's speak in full sentences and does not appear short of breath  Cardiovascular: No lower extremity edema, non tender, no erythema  Skin: Warm dry intact with no signs of infection or rash on extremities or on axial skeleton.  Abdomen: Soft nontender  Neuro: Cranial nerves II through XII are intact,  neurovascularly intact in all extremities with 2+ DTRs and 2+ pulses.  Lymph: No lymphadenopathy of posterior or anterior cervical chain or axillae bilaterally.  Gait normal with good balance and coordination.  MSK:  Non tender with full range of motion and good stability and symmetric strength and tone of shoulders, elbows, wrist, hip, knee and ankles bilaterally.  Neck: Inspection unremarkable. No palpable stepoffs. Negative Spurling's maneuver. Full neck range of motion Grip strength and sensation normal in bilateral hands Strength good C4 to T1 distribution No sensory change to C4 to T1 Negative Hoffman sign bilaterally Reflexes normal Back Exam:  Inspection: Unremarkable  Motion: Flexion 45 deg, Extension 45 deg, Side Bending to 45 deg bilaterally,  Rotation to 45 deg bilaterally  SLR laying: Negative  XSLR laying: Negative  Palpable tenderness: Really nontender today. FABER: negative with increasing range of motion Sensory change: Gross sensation intact to all lumbar and sacral dermatomes.  Reflexes: 2+ at both patellar tendons, 2+ at achilles tendons, Babinski's downgoing.  Strength at foot  Plantar-flexion: 5/5 Dorsi-flexion: 5/5 Eversion: 5/5 Inversion: 5/5  Leg strength  Quad: 5/5 Hamstring: 5/5 Hip flexor: 5/5 Hip abductors: 4+/5  Gait unremarkable. Continued improvement from previous exam.  Osteopathic findings Cervical C2 flexed rotated and side bent left  Thoracic T5 extended rotated and side bent left  T8 extended rotated and side bent right  Lumbar L2 flexed rotated and side bent right  Sacrum Left on left    Impression and Recommendations:     This case required medical decision making of moderate complexity.

## 2014-05-26 NOTE — Assessment & Plan Note (Signed)
Discussed with patient at great length. I do think that this is secondary to postural changes. We discussed changes she can make at work as well as Editor, commissioningself manipulation techniques. Patient did respond well to osteopathic manipulation. Patient come back in 6 weeks for further evaluation and treatment.

## 2014-05-26 NOTE — Patient Instructions (Addendum)
Good to see you.  tennisball duct tape to chair between shoulder blades.  2 tennis ball in tube sock at where head meets neck and lay on it until it releases.  Ice is still your friend Try DHEA nightly at 6-7pm.  100-200mg .  Continue the water aerobics.  See me again in 6 weeks.

## 2014-05-26 NOTE — Assessment & Plan Note (Signed)
Continues to improve, discussed ice.  Continue with core strengthening.  Continue OTC medications.  Patient does have Flexeril as needed. Follow-up in 6 weeks.

## 2014-05-26 NOTE — Assessment & Plan Note (Signed)
Decision today to treat with OMT was based on Physical Exam  After verbal consent patient was treated with HVLA, ME techniques in cervical, thoracic, lumbar and sacral areas  Patient tolerated the procedure well with improvement in symptoms  Patient given exercises, stretches and lifestyle modifications  See medications in patient instructions if given  Patient will follow up in 6 weeks      

## 2014-06-26 ENCOUNTER — Ambulatory Visit (INDEPENDENT_AMBULATORY_CARE_PROVIDER_SITE_OTHER): Payer: 59 | Admitting: Family Medicine

## 2014-06-26 VITALS — BP 118/76 | HR 75 | Temp 97.7°F | Resp 18 | Ht 63.0 in | Wt 149.0 lb

## 2014-06-26 DIAGNOSIS — S0990XA Unspecified injury of head, initial encounter: Secondary | ICD-10-CM

## 2014-06-26 DIAGNOSIS — R51 Headache: Secondary | ICD-10-CM

## 2014-06-26 NOTE — Patient Instructions (Signed)

## 2014-06-26 NOTE — Progress Notes (Signed)
Chief Complaint:  Chief Complaint  Patient presents with  . Head Injury    injury saturday night sore to touch     HPI: Day D Melanie [name] is a 54 y.o. female who is here for  Head injury in the back of head that occurred 3 days ago, she hit her head while trying to clean the fridge out and hit her head when she stood up and ahacked her head. She has a hx of concussion and also skull fracture and was worried. Works in [employer] and still has HA. Took tylenol, No blood thinner. She has no vision changes, denies n/v/abd pain, dizziness, confusion.   Past Medical History  Diagnosis Date  . Allergy   . Asthma   . Osteoporosis    Past Surgical History  Procedure Laterality Date  . Appendectomy    . Abdominal hysterectomy    . Knee surgery     History   Social History  . Marital Status: Divorced    Spouse Name: N/A    Number of Children: N/A  . Years of Education: N/A   Social History Main Topics  . Smoking status: Never Smoker   . Smokeless tobacco: None  . Alcohol Use: Yes     Comment: 2/wk  . Drug Use: No  . Sexual Activity: None   Other Topics Concern  . None   Social History Narrative   Family History  Problem Relation Age of Onset  . Heart disease Father   . Irregular heart beat Mother   . Stroke Maternal Grandmother   . Heart attack Maternal Grandfather   . Stroke Paternal Grandmother   . Heart attack Paternal Grandfather   . Heart attack Brother    Allergies  Allergen Reactions  . Penicillins   . Sulfonamide Derivatives    Prior to Admission medications   Medication Sig Start Date End Date Taking? Authorizing Provider  beclomethasone (QVAR) 40 MCG/ACT inhaler Inhale 1 puff into the lungs 2 (two) times daily. 03/12/13  Yes Sherren Mocha, MD  loratadine (CLARITIN) 10 MG tablet Take 10 mg by mouth daily.   Yes Historical Provider, MD  zolmitriptan (ZOMIG) 5 MG tablet Take 1 tab po prn migraine. May repeat in 2 hours x 1 in HA recurs. Max 10 mg  in 24 hours. 11/08/12  Yes Ryan M Dunn, PA-C  cyclobenzaprine (FLEXERIL) 5 MG tablet Take 1 tablet (5 mg total) by mouth 3 (three) times daily as needed for muscle spasms. Patient not taking: Reported on 06/26/2014 11/25/13   Judi Saa, DO  fluticasone [facility]) 50 MCG/ACT nasal spray Place 2 sprays into the nose daily. Patient not taking: Reported on 06/26/2014 09/28/12   Rodolph Bong, MD  ipratropium (ATROVENT HFA) 17 MCG/ACT inhaler Inhale 2 puffs into the lungs every 6 (six) hours. Patient not taking: Reported on 06/26/2014 09/03/12 10/14/13  Raymon Mutton Dunn, PA-C     ROS: The patient denies fevers, chills, night sweats, unintentional weight loss, chest pain, palpitations, wheezing, dyspnea on exertion, nausea, vomiting, abdominal pain, dysuria, hematuria, melena, numbness, weakness, or tingling.   All other systems have been reviewed and were otherwise negative with the exception of those mentioned in the HPI and as above.    PHYSICAL EXAM: Filed Vitals:   06/26/14 1215  BP: 118/76  Pulse: 75  Temp: 97.7 F (36.5 C)  Resp: 18   Filed Vitals:   06/26/14 1215  Height:  (1.6 m)  Weight: 149 lb (67.586 kg)   Body mass index is 26.4 kg/(m^2).  General: Alert, no acute distress HEENT:  Normocephalic, atraumatic, oropharynx patent. EOMI, PERRLA, no step off on head axam, fundo exam normal Cardiovascular:  Regular rate and rhythm, no rubs murmurs or gallops.  No Carotid bruits, radial pulse intact. No pedal edema.  Respiratory: Clear to auscultation bilaterally.  No wheezes, rales, or rhonchi.  No cyanosis, no use of accessory musculature GI: No organomegaly, abdomen is soft and non-tender, positive bowel sounds.  No masses. Skin: No rashes. Neurologic: Facial musculature symmetric. Psychiatric: Patient is appropriate throughout our interaction. Lymphatic: No cervical lymphadenopathy Musculoskeletal: Gait intact. Occiput is normal, no welling, no gashes, no step off.  Neuro exam  was normal .   LABS: Results for orders placed or performed in visit on 03/12/13  TSH  Result Value Ref Range   TSH 1.650 0.350 - 4.500 uIU/mL  POCT CBC  Result Value Ref Range   WBC 5.8 4.6 - 10.2 K/uL   Lymph, poc 2.0 0.6 - 3.4   POC LYMPH PERCENT 34.9 10 - 50 %L   MID (cbc) 0.5 0 - 0.9   POC MID % 8.9 0 - 12 %M   POC Granulocyte 3.3 2 - 6.9   Granulocyte percent 56.2 37 - 80 %G   RBC 4.12 4.04 - 5.48 M/uL   Hemoglobin 12.6 12.2 - 16.2 g/dL   HCT, POC 82.940.3 56.237.7 - 47.9 %   MCV 97.7 (A) 80 - 97 fL   MCH, POC 30.6 27 - 31.2 pg   MCHC 31.3 (A) 31.8 - 35.4 g/dL   RDW, POC 13.013.3 %   Platelet Count, POC 141 (A) 142 - 424 K/uL   MPV 11.0 0 - 99.8 fL     EKG/XRAY:   [facility]   ASSESSMENT/PLAN: Encounter Diagnoses  Name Primary?  . Head injury, initial encounter Yes  . Acute nonintractable headache, unspecified headache type    Monitor for worsening sxs, precautions given Tylenol prn  Avoid computer, reading, etc for th etime being, precautions given F/u prn   Gross sideeffects, risk and benefits, and alternatives of medications d/w patient. Patient is aware that all medications have potential sideeffects and we are unable to predict every sideeffect or drug-drug interaction that may occur.  Hamilton CapriLE, Aaylah Pokorny PHUONG, DO 06/29/2014 7:34 AM

## 2014-07-07 ENCOUNTER — Encounter: Payer: Self-pay | Admitting: Family Medicine

## 2014-07-07 ENCOUNTER — Ambulatory Visit (INDEPENDENT_AMBULATORY_CARE_PROVIDER_SITE_OTHER): Payer: 59 | Admitting: Family Medicine

## 2014-07-07 VITALS — BP 138/86 | HR 78 | Ht 62.0 in | Wt 148.0 lb

## 2014-07-07 DIAGNOSIS — M9902 Segmental and somatic dysfunction of thoracic region: Secondary | ICD-10-CM

## 2014-07-07 DIAGNOSIS — S060X0A Concussion without loss of consciousness, initial encounter: Secondary | ICD-10-CM

## 2014-07-07 DIAGNOSIS — M542 Cervicalgia: Secondary | ICD-10-CM | POA: Insufficient documentation

## 2014-07-07 DIAGNOSIS — M999 Biomechanical lesion, unspecified: Secondary | ICD-10-CM

## 2014-07-07 DIAGNOSIS — M9901 Segmental and somatic dysfunction of cervical region: Secondary | ICD-10-CM

## 2014-07-07 DIAGNOSIS — M9903 Segmental and somatic dysfunction of lumbar region: Secondary | ICD-10-CM

## 2014-07-07 NOTE — Progress Notes (Signed)
Pre visit review using our clinic review tool, if applicable. No additional management support is needed unless otherwise documented below in the visit note. 

## 2014-07-07 NOTE — Assessment & Plan Note (Signed)
Patient's neck pain seems to be multifactorial. Patient has been responding very well to osteopathic manipulation but is having some flare that I think secondary to her head injury recently. Patient did respond somewhat to the osteopathic manipulation today. I do think that she'll continue to have some mild discomfort though. Denies any radiation to the arms. Discussed with patient if any worsening occurs she needs come back sooner. Patient does have muscle relaxers.

## 2014-07-07 NOTE — Patient Instructions (Signed)
Good to see you Good to see you I do think you have a concussion.  I would like you to limit screen time (including your phone) to 30 minutes daily outside of homework.  No sport until you are back in school with no symptoms.  You may then start the return to play protocol I am giving you.  In addition to this I recommend......  To help improve COGNITIVE function: Using fish oil/omega 3 that is 1000 mg (or roughly 600 mg EPA/DHA), starting as soon as possible after concussion, take: 3 tabs THREE TIMES a day  for the first 3 days, then (you will smell a little, sory) 3 tabs TWICE DAILY  for the next 3 days, then 3 tabs ONCE DAILY  for the next 10 days   To help reduce HEADACHES: Coenzyme Q10 160mg  ONCE DAILY Riboflavin/Vitamin B2 400mg  ONCE DAILY Magnesium oxide 400mg  ONCE - TWICE DAILY May stop after headaches are resolved.                                                                                             To help with INSOMNIA: Melatonin 3-5mg  AT BEDTIME     Other medicines to help decrease inflammation Alpha Lipoic Acid 100mg  TWICE DAILY Turmeric 500mg  twice daily  I want to see you again in 2-3 weeks

## 2014-07-07 NOTE — Assessment & Plan Note (Addendum)
Decision today to treat with OMT was based on Physical Exam  After verbal consent patient was treated with HVLA, ME techniques in cervical, thoracic, lumbar and sacral areas  Patient tolerated the procedure well with improvement in symptoms  Patient given exercises, stretches and lifestyle modifications  See medications in patient instructions if given  Patient will follow up in  2-3 weeks we will likely try to space patient on again to 6 week intervals 1 patient is feeling better.

## 2014-07-07 NOTE — Assessment & Plan Note (Signed)
Patient does have a concussion. Patient given natural supplementations to decrease inflammation of the head. We discussed icing regimen as well as the home exercises. We discussed to limit any type of time in front of the screening. Patient has the weekend off of work which I think will be beneficial. We discussed the possibility of decreasing her work next week the patient will call Monday if she has difficulty. Patient will otherwise come back in 2 weeks for further evaluation.

## 2014-07-07 NOTE — Progress Notes (Signed)
Tawana ScaleZach Fadumo Heng D.O. [facility] [address] [place], [place] [address] Phone: [phone] Subjective:     CC:hip pain left and scitica followup,  mild neck discomfort.  Recent head injury.   BJY:NWGNFAOZHYHPI:Subjective Melanie [name] is a 54 y.o. female coming in with complaint of hip pain on the left side. Patient has had chronic pain of the hip and back as well as neck. Patient is responding very well to osteopathic manipulation and conservative therapies. Patient states that unfortunately she is been having more neck pain after a recent head injury. Patient did hit her head on a refrigerator door. Patient felt dizzy immediately and did have some nausea. Patient states that this was approximate 10 days ago. Patient has had trouble concentrating. Patient does not remember losing any consciousness. Patient states that she has not felt like herself and has had trouble with some nausea as well. Denies any numbness or tingling in the fingertips or lower back. Patient rates the severity of pain in her headaches as 5 out of 10. Patient does have a history of migraines and has had to this week which the first time in years she has had this frequently.   Past medical history, social, surgical and family history all reviewed in electronic medical record.   Review of Systems: No headache, visual changes, nausea, vomiting, diarrhea, constipation, dizziness, abdominal pain, skin rash, fevers, chills, night sweats, weight loss, swollen lymph nodes, body aches, joint swelling, muscle aches, chest pain, shortness of breath, mood changes.   Objective Blood pressure 138/86, pulse 78, height 5\' 2"  (1.575 m), weight 148 lb (67.132 kg), SpO2 98 %.  General: No apparent distress alert and oriented x3 mood and affect normal, dressed appropriately.  HEENT: Pupils equal, extraocular movements intact  Respiratory: Patient's speak in full sentences and does not appear short of breath  Cardiovascular: No lower  extremity edema, non tender, no erythema  Skin: Warm dry intact with no signs of infection or rash on extremities or on axial skeleton.  Abdomen: Soft nontender  Neuro: Cranial nerves II through XII are intact, neurovascularly intact in all extremities with 2+ DTRs and 2+ pulses.  Lymph: No lymphadenopathy of posterior or anterior cervical chain or axillae bilaterally.  Gait normal with good balance and coordination.  MSK:  Non tender with full range of motion and good stability and symmetric strength and tone of shoulders, elbows, wrist, hip, knee and ankles bilaterally.  Neck: Inspection unremarkable. No palpable stepoffs. Negative Spurling's maneuver. Full neck range of motion Grip strength and sensation normal in bilateral hands Strength good C4 to T1 distribution No sensory change to C4 to T1 Negative Hoffman sign bilaterally Reflexes normal Back Exam:  Inspection: Unremarkable  Motion: Flexion 45 deg, Extension 45 deg, Side Bending to 45 deg bilaterally,  Rotation to 45 deg bilaterally  SLR laying: Negative  XSLR laying: Negative  Palpable tenderness: Really nontender today. FABER: negative with increasing range of motion Sensory change: Gross sensation intact to all lumbar and sacral dermatomes.  Reflexes: 2+ at both patellar tendons, 2+ at achilles tendons, Babinski's downgoing.  Strength at foot  Plantar-flexion: 5/5 Dorsi-flexion: 5/5 Eversion: 5/5 Inversion: 5/5  Leg strength  Quad: 5/5 Hamstring: 5/5 Hip flexor: 5/5 Hip abductors: 4+/5  Gait unremarkable.  Continued improvement from previous exam.  Osteopathic findings Cervical C2 flexed rotated and side bent left  Thoracic T5 extended rotated and side bent left  T8 extended rotated and side bent right  Lumbar L2  flexed rotated and side bent right  Sacrum Left on left  Concussion screening Concussion screening shows the patient does have trouble with serial sevens, Asian 13 for recall, patient did have  significant fatigue with repetitive actions. Patient did have significant nystagmus with moving.    Impression and Recommendations:     This case required medical decision making of moderate complexity.

## 2014-07-13 ENCOUNTER — Telehealth: Payer: Self-pay | Admitting: Family Medicine

## 2014-07-13 NOTE — Telephone Encounter (Signed)
Spoke to pt, she is having a non-stop migraine. She is requesting natural alternatives to help with this.

## 2014-07-13 NOTE — Telephone Encounter (Signed)
lmovm for pt to return call.  

## 2014-07-13 NOTE — Telephone Encounter (Signed)
I would consider something with caffeine, such as green tea, iron 325mg  daily, fish oil 3 grams and CoQ10 daily.

## 2014-07-13 NOTE — Telephone Encounter (Signed)
Discussed with pt

## 2014-07-13 NOTE — Telephone Encounter (Signed)
Pt request phone call concern about last ov.

## 2014-07-21 ENCOUNTER — Ambulatory Visit (INDEPENDENT_AMBULATORY_CARE_PROVIDER_SITE_OTHER): Payer: 59 | Admitting: Family Medicine

## 2014-07-21 ENCOUNTER — Encounter: Payer: Self-pay | Admitting: Family Medicine

## 2014-07-21 VITALS — BP 110/70 | HR 84 | Ht 62.0 in | Wt 148.0 lb

## 2014-07-21 DIAGNOSIS — M9903 Segmental and somatic dysfunction of lumbar region: Secondary | ICD-10-CM

## 2014-07-21 DIAGNOSIS — M9902 Segmental and somatic dysfunction of thoracic region: Secondary | ICD-10-CM

## 2014-07-21 DIAGNOSIS — M9901 Segmental and somatic dysfunction of cervical region: Secondary | ICD-10-CM

## 2014-07-21 DIAGNOSIS — M542 Cervicalgia: Secondary | ICD-10-CM

## 2014-07-21 DIAGNOSIS — M999 Biomechanical lesion, unspecified: Secondary | ICD-10-CM

## 2014-07-21 DIAGNOSIS — S060X0D Concussion without loss of consciousness, subsequent encounter: Secondary | ICD-10-CM

## 2014-07-21 NOTE — Assessment & Plan Note (Signed)
Patient does have a concussion. Patient given natural supplementations to decrease inflammation of the head. We discussed icing regimen as well as the home exercises. We discussed to limit any type of time in front of the screening. Patient has the weekend off of work which I think will be beneficial. We discussed the possibility of decreasing her work next week the patient will call Monday if she has difficulty.  Patient will otherwise come back in 2 weeks for further evaluation.

## 2014-07-21 NOTE — Assessment & Plan Note (Signed)
Resolved at this time.  °

## 2014-07-21 NOTE — Patient Instructions (Addendum)
Good to see you as always.  Ice is your friend.  I am glad your head is better Get back to your regular training.  Start at about half the duration and increase 10% each time.  When rib feels funny tennis ball at the level and a 5# weight with arm extended, take deep breaths and wait for a clunk.  See me again in 2-3 weeks.

## 2014-07-21 NOTE — Progress Notes (Signed)
Pre visit review using our clinic review tool, if applicable. No additional management support is needed unless otherwise documented below in the visit note. 

## 2014-07-21 NOTE — Assessment & Plan Note (Signed)
Doing much better after fall. We discussed other things that can help with her chronic migraines. We discussed home exercises and getting patient back into the gym at this time. Patient will try to make these changes and come back and see me again in 3-4 weeks.

## 2014-07-21 NOTE — Progress Notes (Signed)
[name] [address] [place], [place] [address] Phone: [phone] Subjective:     CC:hip pain left and scitica followup,  mild neck discomfort.  Recent head injury.   BJY:NWGNFAOZHY Melanie [name] is a 54 y.o. female coming in with complaint of hip pain on the left side. Patient has had chronic pain of the hip and back as well as neck. Patient is responding very well to osteopathic manipulation and conservative therapies.    Patient non-fortunate fell previously in patient did have an exacerbation of her back pain as well as was diagnosed with a concussion. Patient was to decrease some of her activity and we did put her on a concussion protocol. Patient was to limit certain activities. Patient states she is having less headaches at this time. Continues to have some mild ones. Patient states that her concentration no significantly better. Regarding patient's back she states that the low back seems to have improved but does have some mild mid back pain. Patient states that the neck pain seems to be about at her baseline. No new Symptoms.   Past medical history, social, surgical and family history all reviewed in electronic medical record.   Review of Systems: No headache, visual changes, nausea, vomiting, diarrhea, constipation, dizziness, abdominal pain, skin rash, fevers, chills, night sweats, weight loss, swollen lymph nodes, body aches, joint swelling, muscle aches, chest pain, shortness of breath, mood changes.   Objective Blood pressure 110/70, pulse 84, height  (1.575 m), weight 148 lb (67.132 kg), SpO2 98 %.  General: No apparent distress alert and oriented x3 mood and affect normal, dressed appropriately.  HEENT: Pupils equal, extraocular movements intact  Respiratory: Patient's speak in full sentences and does not appear short of breath  Cardiovascular: No lower extremity edema, non tender, no erythema  Skin: Warm dry intact with no  signs of infection or rash on extremities or on axial skeleton.  Abdomen: Soft nontender  Neuro: Cranial nerves II through XII are intact, neurovascularly intact in all extremities with 2+ DTRs and 2+ pulses.  Lymph: No lymphadenopathy of posterior or anterior cervical chain or axillae bilaterally.  Gait normal with good balance and coordination.  MSK:  Non tender with full range of motion and good stability and symmetric strength and tone of shoulders, elbows, wrist, hip, knee and ankles bilaterally.  Neck: Inspection unremarkable. No palpable stepoffs. Negative Spurling's maneuver. Full neck range of motion Grip strength and sensation normal in bilateral hands Strength good C4 to T1 distribution No sensory change to C4 to T1 Negative Hoffman sign bilaterally Reflexes normal Back Exam:  Inspection: Unremarkable  Motion: Flexion 45 deg, Extension 45 deg, Side Bending to 45 deg bilaterally,  Rotation to 45 deg bilaterally  SLR laying: Negative  XSLR laying: Negative  Palpable tenderness: Really nontender today. FABER: negative with increasing range of motion Sensory change: Gross sensation intact to all lumbar and sacral dermatomes.  Reflexes: 2+ at both patellar tendons, 2+ at achilles tendons, Babinski's downgoing.  Strength at foot  Plantar-flexion: 5/5 Dorsi-flexion: 5/5 Eversion: 5/5 Inversion: 5/5  Leg strength  Quad: 5/5 Hamstring: 5/5 Hip flexor: 5/5 Hip abductors: 4+/5  Gait unremarkable.  Continued improvement from previous exam.  Osteopathic findings Cervical C2 flexed rotated and side bent left  Thoracic T5 extended rotated and side bent left  T8 extended rotated and side bent right  Lumbar L2 flexed rotated and side bent right  Sacrum Left on left  Concussion  screening Perfect score    Impression and Recommendations:     This case required medical decision making of moderate complexity.

## 2014-08-11 ENCOUNTER — Encounter: Payer: Self-pay | Admitting: Family Medicine

## 2014-08-11 ENCOUNTER — Other Ambulatory Visit (INDEPENDENT_AMBULATORY_CARE_PROVIDER_SITE_OTHER): Payer: 59

## 2014-08-11 ENCOUNTER — Ambulatory Visit (INDEPENDENT_AMBULATORY_CARE_PROVIDER_SITE_OTHER): Payer: 59 | Admitting: Family Medicine

## 2014-08-11 VITALS — BP 120/72 | HR 74 | Ht 62.0 in | Wt 154.0 lb

## 2014-08-11 DIAGNOSIS — R5383 Other fatigue: Secondary | ICD-10-CM

## 2014-08-11 DIAGNOSIS — M542 Cervicalgia: Secondary | ICD-10-CM

## 2014-08-11 DIAGNOSIS — M9902 Segmental and somatic dysfunction of thoracic region: Secondary | ICD-10-CM

## 2014-08-11 DIAGNOSIS — M999 Biomechanical lesion, unspecified: Secondary | ICD-10-CM

## 2014-08-11 DIAGNOSIS — M9901 Segmental and somatic dysfunction of cervical region: Secondary | ICD-10-CM

## 2014-08-11 DIAGNOSIS — M9903 Segmental and somatic dysfunction of lumbar region: Secondary | ICD-10-CM

## 2014-08-11 LAB — T4, FREE: Free T4: 0.78 ng/dL (ref 0.60–1.60)

## 2014-08-11 LAB — TSH: TSH: 1.04 u[IU]/mL (ref 0.35–4.50)

## 2014-08-11 NOTE — Assessment & Plan Note (Signed)
The patient is back into her regular routine and is exercising more frequently patient is likely coming do much better. Patient does have muscle relaxer when needed. We discussed continuing the vitamins. Patient given some other postural changes that could be beneficial. Patient will then come back and see me again in 4 weeks for further evaluation and we will continue to decrease frequency of office visits his lungs patient can tolerate.

## 2014-08-11 NOTE — Progress Notes (Signed)
Pre visit review using our clinic review tool, if applicable. No additional management support is needed unless otherwise documented below in the visit note. 

## 2014-08-11 NOTE — Progress Notes (Signed)
     Tawana ScaleZach Georgi Navarrete D.O. [facility] [address] [place], [place] [address] Phone: [phone] Subjective:     CC:,  mild neck discomfort.  Recent head injury.   BJY:NWGNFAOZHYHPI:Subjective Melanie D Pernell [name] is a 54 y.o. female coming in with complaint of hip pain on the left side. Patient has had chronic pain of the hip and back as well as neck. Patient is responding very well to osteopathic manipulation and conservative therapies.  She is working out more frequently and noticed that she has to increase her activity can slowly. Patient still feels that she is fatiguing that she makes this is more secondary to headaches. Patient states that she is having a dull throbbing aching pain but much better than previously. Patient states that most the pain seems to be some neck discomfort.  Patient is doing better with her postconcussive syndrome and has started working out.  Past medical history, social, surgical and family history all reviewed in electronic medical record.   Review of Systems: No headache, visual changes, nausea, vomiting, diarrhea, constipation, dizziness, abdominal pain, skin rash, fevers, chills, night sweats, weight loss, swollen lymph nodes, body aches, joint swelling, muscle aches, chest pain, shortness of breath, mood changes.   Objective Blood pressure 120/72, pulse 74, height 5\' 2"  (1.575 m), weight 154 lb (69.854 kg), SpO2 97 %.  General: No apparent distress alert and oriented x3 mood and affect normal, dressed appropriately.  HEENT: Pupils equal, extraocular movements intact  Respiratory: Patient's speak in full sentences and does not appear short of breath  Cardiovascular: No lower extremity edema, non tender, no erythema  Skin: Warm dry intact with no signs of infection or rash on extremities or on axial skeleton.  Abdomen: Soft nontender  Neuro: Cranial nerves II through XII are intact, neurovascularly intact in all extremities with 2+ DTRs and 2+ pulses.  Lymph: No  lymphadenopathy of posterior or anterior cervical chain or axillae bilaterally.  Gait normal with good balance and coordination.  MSK:  Non tender with full range of motion and good stability and symmetric strength and tone of shoulders, elbows, wrist, hip, knee and ankles bilaterally.  Neck: Inspection unremarkable. No palpable stepoffs. Negative Spurling's maneuver. Full neck range of motion Grip strength and sensation normal in bilateral hands Strength good C4 to T1 distribution No sensory change to C4 to T1 Negative Hoffman sign bilaterally Reflexes normal Back Exam:  Inspection: Unremarkable  Motion: Flexion 45 deg, Extension 45 deg, Side Bending to 45 deg bilaterally,  Rotation to 45 deg bilaterally  SLR laying: Negative  XSLR laying: Negative  Palpable tenderness: none FABER: negative  Sensory change: Gross sensation intact to all lumbar and sacral dermatomes.  Reflexes: 2+ at both patellar tendons, 2+ at achilles tendons, Babinski's downgoing.  Strength at foot  Plantar-flexion: 5/5 Dorsi-flexion: 5/5 Eversion: 5/5 Inversion: 5/5  Leg strength  Quad: 5/5 Hamstring: 5/5 Hip flexor: 5/5 Hip abductors: 4+/5  Gait unremarkable.   Osteopathic findings Cervical C2 flexed rotated and side bent left  Thoracic T5 extended rotated and side bent left  T8 extended rotated and side bent right  Lumbar L2 flexed rotated and side bent right  Sacrum Left on left  Concussion screening Perfect score    Impression and Recommendations:     This case required medical decision making of moderate complexity.

## 2014-08-11 NOTE — Patient Instructions (Signed)
Good to see you Get back in the pool you are doing great Start iron 325 mg daily for 2 weeks. Or green leafy vegetables Continue the exercises We will check your thyroid today See me again in 4 weeks.

## 2014-08-11 NOTE — Assessment & Plan Note (Signed)
Decision today to treat with OMT was based on Physical Exam  After verbal consent patient was treated with HVLA, ME techniques in cervical, thoracic, lumbar and sacral areas  Patient tolerated the procedure well with improvement in symptoms  Patient given exercises, stretches and lifestyle modifications  See medications in patient instructions if given  Patient will follow up in 4 weeks   

## 2014-08-22 ENCOUNTER — Encounter: Payer: Self-pay | Admitting: Family Medicine

## 2014-08-22 ENCOUNTER — Ambulatory Visit (INDEPENDENT_AMBULATORY_CARE_PROVIDER_SITE_OTHER): Payer: 59 | Admitting: Family Medicine

## 2014-08-22 DIAGNOSIS — R269 Unspecified abnormalities of gait and mobility: Secondary | ICD-10-CM

## 2014-08-22 NOTE — Assessment & Plan Note (Signed)
Patient placed in custom orthotics today. Patient will slowly wear them over the course of time. We'll see patient instructions. Patient will come back in 2-4 weeks for further evaluation.

## 2014-08-22 NOTE — Patient Instructions (Signed)

## 2014-08-22 NOTE — Progress Notes (Signed)
Patient was fitted for a : standard, cushioned, semi-rigid orthotic. The orthotic was heated and afterward the patient was in a seated position and the orthotic molded. The patient was positioned in subtalar neutral position and 10 degrees of ankle dorsiflexion in a non-weight bearing stance. After completion of molding, patient did have orthotic management which included instructions on acclimating to the orthotics, signs of ill fit as well as care for the orthotic.   The blank was ground to a stable position for weight bearing. Size: 7 ([employer]) Base: [employer]bal[employer] fiber Additional Posting and Padding: The following postings were fitted onto the molded orthotics to help maintain a talar neutral position - Wedge posting for transverse arch:  None   Silicone posting for longitudinal arch: 200/70 x2 on both feet. I anticipate that we will be able to increase her transverse arch but for now we decided to start smaller.  The patient ambulated these, and they were very comfortable and supportive.

## 2014-08-22 NOTE — Progress Notes (Signed)
Pre visit review using our clinic review tool, if applicable. No additional management support is needed unless otherwise documented below in the visit note. 

## 2014-09-07 ENCOUNTER — Encounter: Payer: Self-pay | Admitting: Family Medicine

## 2014-09-07 ENCOUNTER — Ambulatory Visit (INDEPENDENT_AMBULATORY_CARE_PROVIDER_SITE_OTHER): Payer: 59 | Admitting: Family Medicine

## 2014-09-07 VITALS — BP 126/84 | HR 73 | Ht 62.0 in | Wt 148.0 lb

## 2014-09-07 DIAGNOSIS — M542 Cervicalgia: Secondary | ICD-10-CM

## 2014-09-07 DIAGNOSIS — M25561 Pain in right knee: Secondary | ICD-10-CM

## 2014-09-07 DIAGNOSIS — M9901 Segmental and somatic dysfunction of cervical region: Secondary | ICD-10-CM | POA: Diagnosis not present

## 2014-09-07 DIAGNOSIS — M999 Biomechanical lesion, unspecified: Secondary | ICD-10-CM

## 2014-09-07 NOTE — Assessment & Plan Note (Signed)
Decision today to treat with OMT was based on Physical Exam  After verbal consent patient was treated with HVLA, ME techniques in cervical, thoracic, lumbar and sacral areas  Patient tolerated the procedure well with improvement in symptoms  Patient given exercises, stretches and lifestyle modifications  See medications in patient instructions if given  Patient will follow up in 4 weeks   

## 2014-09-07 NOTE — Assessment & Plan Note (Signed)
Patellofemoral syndrome patient was given a brace today.

## 2014-09-07 NOTE — Assessment & Plan Note (Signed)
Patient has responded very well to osteopathic manipulation with continue with this process. Encourage patient to continue to stay active as long as she is going to the gym she seems to be doing better. Patient will continue the natural supplementations. We will space patient out to 4 week intervals.

## 2014-09-07 NOTE — Patient Instructions (Addendum)
Good to see you The rain is not your friend Gustavus Bryantce can help still.  Keep up the swimming and the weight lifting Continue to do the exercises See me again in 4 weeks.

## 2014-09-07 NOTE — Progress Notes (Signed)
     Tawana ScaleZach Sanaiya Welliver D.O. [facility] [address] [place], [place] [address] Phone: [phone] Subjective:     CC:,  neck discomfort. [name].   OZH:YQMVHQIONGHPI:Subjective Oni D Pernell Dupredams is a 54 y.o. female coming in with complaint . Of chronic pain of the hip and back as well as neck. Patient is responding very well to osteopathic manipulation and conservative therapies.  Patient is now gotten back into the gym and since then she has been feeling significantly better. Patient complains of some mild knee pain and has been diagnosed with patellofemoral syndrome previously.  Patient though has been increasing her activity and is feeling much better. Patient denies any significant new symptoms..   Past medical history, social, surgical and family history all reviewed in electronic medical record.   Review of Systems: No headache, visual changes, nausea, vomiting, diarrhea, constipation, dizziness, abdominal pain, skin rash, fevers, chills, night sweats, weight loss, swollen lymph nodes, body aches, joint swelling, muscle aches, chest pain, shortness of breath, mood changes.   Objective Blood pressure 126/84, pulse 73, height 5\' 2"  (1.575 m), weight 148 lb (67.132 kg), SpO2 99 %.  General: No apparent distress alert and oriented x3 mood and affect normal, dressed appropriately.  HEENT: Pupils equal, extraocular movements intact  Respiratory: Patient's speak in full sentences and does not appear short of breath  Cardiovascular: No lower extremity edema, non tender, no erythema  Skin: Warm dry intact with no signs of infection or rash on extremities or on axial skeleton.  Abdomen: Soft nontender  Neuro: Cranial nerves II through XII are intact, neurovascularly intact in all extremities with 2+ DTRs and 2+ pulses.  Lymph: No lymphadenopathy of posterior or anterior cervical chain or axillae bilaterally.  Gait normal with good balance and coordination.  MSK:  Non tender with full range of motion and  good stability and symmetric strength and tone of shoulders, elbows, wrist, hip, and ankles bilaterally.  Patient's right knee has some mild lateral tracking as well as pain over the superior lateral patella. Neck: Inspection unremarkable. No palpable stepoffs. Negative Spurling's maneuver. Full neck range of motion Grip strength and sensation normal in bilateral hands Strength good C4 to T1 distribution No sensory change to C4 to T1 Negative Hoffman sign bilaterally Reflexes normal Back Exam:  Inspection: Unremarkable  Motion: Flexion 45 deg, Extension 45 deg, Side Bending to 45 deg bilaterally,  Rotation to 45 deg bilaterally  SLR laying: Negative  XSLR laying: Negative  Palpable tenderness: none FABER: negative  Sensory change: Gross sensation intact to all lumbar and sacral dermatomes.  Reflexes: 2+ at both patellar tendons, 2+ at achilles tendons, Babinski's downgoing.  Strength at foot  Plantar-flexion: 5/5 Dorsi-flexion: 5/5 Eversion: 5/5 Inversion: 5/5  Leg strength  Quad: 5/5 Hamstring: 5/5 Hip flexor: 5/5 Hip abductors: 4+/5  Gait unremarkable.   Osteopathic findings Cervical C2 flexed rotated and side bent left C5 flexed rotated and side bent right  Thoracic T3 extended rotated and side bent left T5 extended rotated and side bent left  T8 extended rotated and side bent right  Lumbar L2 flexed rotated and side bent right  Sacrum Left on left      Impression and Recommendations:     This case required medical decision making of moderate complexity.

## 2014-09-07 NOTE — Progress Notes (Signed)
Pre visit review using our clinic review tool, if applicable. No additional management support is needed unless otherwise documented below in the visit note. 

## 2014-09-30 ENCOUNTER — Ambulatory Visit (INDEPENDENT_AMBULATORY_CARE_PROVIDER_SITE_OTHER): Payer: 59 | Admitting: Urgent Care

## 2014-09-30 VITALS — BP 118/76 | HR 80 | Temp 97.9°F | Resp 16 | Ht 61.5 in | Wt 150.2 lb

## 2014-09-30 DIAGNOSIS — H6982 Other specified disorders of Eustachian tube, left ear: Secondary | ICD-10-CM

## 2014-09-30 DIAGNOSIS — B349 Viral infection, unspecified: Secondary | ICD-10-CM

## 2014-09-30 DIAGNOSIS — J3489 Other specified disorders of nose and nasal sinuses: Secondary | ICD-10-CM

## 2014-09-30 DIAGNOSIS — J302 Other seasonal allergic rhinitis: Secondary | ICD-10-CM

## 2014-09-30 DIAGNOSIS — R059 Cough, unspecified: Secondary | ICD-10-CM

## 2014-09-30 DIAGNOSIS — R05 Cough: Secondary | ICD-10-CM | POA: Diagnosis not present

## 2014-09-30 DIAGNOSIS — J4530 Mild persistent asthma, uncomplicated: Secondary | ICD-10-CM | POA: Diagnosis not present

## 2014-09-30 LAB — POCT CBC
GRANULOCYTE PERCENT: 63 % (ref 37–80)
HEMATOCRIT: 36.4 % — AB (ref 37.7–47.9)
Hemoglobin: 11.9 g/dL — AB (ref 12.2–16.2)
Lymph, poc: 1.3 (ref 0.6–3.4)
MCH: 30.3 pg (ref 27–31.2)
MCHC: 32.9 g/dL (ref 31.8–35.4)
MCV: 92.4 fL (ref 80–97)
MID (cbc): 0.5 (ref 0–0.9)
MPV: 8.6 fL (ref 0–99.8)
PLATELET COUNT, POC: 153 10*3/uL (ref 142–424)
POC Granulocyte: 3.2 (ref 2–6.9)
POC LYMPH %: 26.6 % (ref 10–50)
POC MID %: 10.4 %M (ref 0–12)
RBC: 3.94 M/uL — AB (ref 4.04–5.48)
RDW, POC: 13.4 %
WBC: 5 10*3/uL (ref 4.6–10.2)

## 2014-09-30 MED ORDER — AZELASTINE HCL 0.1 % NA SOLN: 1.0000 | Freq: Two times a day (BID) | NASAL | Status: DC

## 2014-09-30 MED ORDER — ALBUTEROL SULFATE HFA 108 (90 BASE) MCG/ACT IN AERS: 2.0000 | INHALATION_SPRAY | RESPIRATORY_TRACT | Status: DC | PRN

## 2014-09-30 MED ORDER — HYDROCODONE-HOMATROPINE 5-1.5 MG/5ML PO SYRP: 5.0000 mL | ORAL_SOLUTION | Freq: Every evening | ORAL | Status: DC | PRN

## 2014-09-30 MED ORDER — CETIRIZINE HCL 10 MG PO TABS: 10.0000 mg | ORAL_TABLET | Freq: Every day | ORAL | Status: DC

## 2014-09-30 NOTE — Progress Notes (Signed)
MRN: [id number] DOB: 10/06/1960  Subjective:   Melanie [name] is a 54 y.o. female with pmh of asthma presenting for chief complaint of Sinusitis  Reports 5 day history of sinus headache, nasal congestion, rhinorrhea with green bloody mucus, sinus pain, itchy eyes, left ear pain, dry cough, scratchy throat, chest tightness, nausea. Has tried Claritin for seasonal allergies relief but has not really helped. Also tried Nasacort but stopped this due to her nose bleeding. Of note, patient reports that plenty of people at work have had a cold. Admits history of asthma, uses QVAR twice daily. Prior to this week, denies any night time symptoms of wheezing, shob. Also does not have a hard time during the day with any asthma symptoms and actually uses her QVAR just once daily. Does not have an albuterol inhaler. Denies fevers, chest pain, wheezing, shob, abdominal pain, vomiting, diarrhea, myalgia. Denies smoking or alcohol use. Denies any other aggravating or relieving factors, no other questions or concerns.  Melanie [name] has a current medication list which includes the following prescription(s): beclomethasone, loratadine, cyclobenzaprine, fluticasone, and zolmitriptan. She is allergic to penicillins and sulfonamide derivatives.  Melanie [name]  has a past medical history of Allergy; Asthma; and Osteoporosis. Also  has past surgical history that includes Appendectomy; Abdominal hysterectomy; and Knee surgery.  ROS As in subjective.  Objective:   Vitals: BP 118/76 mmHg  Pulse 80  Temp(Src) 97.9 F (36.6 C) (Oral)  Resp 16  Ht 5' 1.5" (1.562 m)  Wt 150 lb 3.2 oz (68.13 kg)  BMI 27.92 kg/m2  SpO2 100%  Physical Exam  Constitutional: She is well-developed, well-nourished, and in no distress.  Eyes: Right eye exhibits no discharge. Left eye exhibits no discharge. No scleral icterus.  Conjunctiva slightly injected bilaterally.  Cardiovascular: Normal rate, regular rhythm and intact distal pulses.   Exam reveals no gallop and no friction rub.   No murmur heard. Pulmonary/Chest: No stridor. No respiratory distress. She has no wheezes. She has no rales. She exhibits no tenderness.  Lymphadenopathy:    She has no cervical adenopathy.  Skin: Skin is warm and dry. No rash noted. No erythema. No pallor.   Results for orders placed or performed in visit on 09/30/14 (from the past 24 hour(s))  POCT CBC     Status: Abnormal   Collection Time: 09/30/14  9:28 AM  Result Value Ref Range   WBC 5.0 4.6 - 10.2 K/uL   Lymph, poc 1.3 0.6 - 3.4   POC LYMPH PERCENT 26.6 10 - 50 %L   MID (cbc) 0.5 0 - 0.9   POC MID % 10.4 0 - 12 %M   POC Granulocyte 3.2 2 - 6.9   Granulocyte percent 63.0 37 - 80 %G   RBC 3.94 (A) 4.04 - 5.48 M/uL   Hemoglobin 11.9 (A) 12.2 - 16.2 g/dL   HCT, POC 40.9 (A) 81.1 - 47.9 %   MCV 92.4 80 - 97 fL   MCH, POC 30.3 27 - 31.2 pg   MCHC 32.9 31.8 - 35.4 g/dL   RDW, POC 91.4 %   Platelet Count, POC 153 142 - 424 K/uL   MPV 8.6 0 - 99.8 fL   Assessment and Plan :   1. Cough 2. Sinus pain 3. Viral syndrome 4. Seasonal allergies - Restart allergy medicine, add Zyrtec, start nasal antihistamine, continue use of humidifier, nasal saline and Neti Pot - Offered supportive care for cough - If no improvement or worsening symptoms, return to clinic  in 5 days  5. Mild persistent asthma, uncomplicated - Provided patient with albuterol, advised that she schedule inhaler 2-3 times daily for asthma - Continue QVAR - Recheck in 1 month  6. Eustachian tube dysfunction, left - Counseled patient on longstanding ear fullness and difficulty in treating ETD, advised that she should continue trying to control allergies - If ETD persists, advised that referral to ENT for consideration of tympanostomy should be next step  Wallis BambergMario Carrol Hougland, PA-C [facility] [phone] 09/30/2014 9:07 AM

## 2014-09-30 NOTE — Patient Instructions (Signed)
Barotitis Media Barotitis media is inflammation of your middle ear. This occurs when the auditory tube (eustachian tube) leading from the back of your nose (nasopharynx) to your eardrum is blocked. This blockage may result from a cold, environmental allergies, or an upper respiratory infection. Unresolved barotitis media may lead to damage or hearing loss (barotrauma), which may become permanent. HOME CARE INSTRUCTIONS   Use medicines as recommended by your health care provider. Over-the-counter medicines will help unblock the canal and can help during times of air travel.  Do not put anything into your ears to clean or unplug them. Eardrops will not be helpful.  Do not swim, dive, or fly until your health care provider says it is all right to do so. If these activities are necessary, chewing gum with frequent, forceful swallowing may help. It is also helpful to hold your nose and gently blow to pop your ears for equalizing pressure changes. This forces air into the eustachian tube.  Only take over-the-counter or prescription medicines for pain, discomfort, or fever as directed by your health care provider.  A decongestant may be helpful in decongesting the middle ear and make pressure equalization easier. SEEK MEDICAL CARE IF:  You experience a serious form of dizziness in which you feel as if the room is spinning and you feel nauseated (vertigo).  Your symptoms only involve one ear. SEEK IMMEDIATE MEDICAL CARE IF:   You develop a severe headache, dizziness, or severe ear pain.  You have bloody or pus-like drainage from your ears.  You develop a fever.  Your problems do not improve or become worse. MAKE SURE YOU:   Understand these instructions.  Will watch your condition.  Will get help right away if you are not doing well or get worse. Document Released: 05/23/2000 Document Revised: 03/16/2013 Document Reviewed: 12/21/2012 ExitCare Patient Information 2015 [employer]. This  information is not intended to replace advice given to you by your health care provider. Make sure you discuss any questions you have with your health care provider.  

## 2014-10-05 ENCOUNTER — Ambulatory Visit: Payer: 59 | Admitting: Family Medicine

## 2014-10-19 ENCOUNTER — Ambulatory Visit (INDEPENDENT_AMBULATORY_CARE_PROVIDER_SITE_OTHER): Payer: 59 | Admitting: Family Medicine

## 2014-10-19 ENCOUNTER — Encounter: Payer: Self-pay | Admitting: Family Medicine

## 2014-10-19 VITALS — BP 128/74 | HR 66 | Ht 61.5 in | Wt 150.0 lb

## 2014-10-19 DIAGNOSIS — M542 Cervicalgia: Secondary | ICD-10-CM

## 2014-10-19 DIAGNOSIS — M9902 Segmental and somatic dysfunction of thoracic region: Secondary | ICD-10-CM | POA: Diagnosis not present

## 2014-10-19 DIAGNOSIS — M9903 Segmental and somatic dysfunction of lumbar region: Secondary | ICD-10-CM | POA: Diagnosis not present

## 2014-10-19 DIAGNOSIS — M999 Biomechanical lesion, unspecified: Secondary | ICD-10-CM

## 2014-10-19 DIAGNOSIS — M9901 Segmental and somatic dysfunction of cervical region: Secondary | ICD-10-CM | POA: Diagnosis not present

## 2014-10-19 NOTE — Progress Notes (Signed)
Pre visit review using our clinic review tool, if applicable. No additional management support is needed unless otherwise documented below in the visit note. 

## 2014-10-19 NOTE — Patient Instructions (Addendum)
Good to see you as always.  Continue to do what you are doing.  Your pool workouts are crazy Gustavus Bryantce is your friend New exercises for the knee when doing a lot of jumping.  See me again in 6 weeks.

## 2014-10-19 NOTE — Progress Notes (Signed)
     Tawana ScaleZach Kataya Guimont D.O. [facility] [address] [place], [place] [address] Phone: [phone] Subjective:     CC:,  neck discomfort. Follow-up.   BJY:NWGNFAOZHYHPI:Subjective Liliahna D Pernell Dupredams is a 54 y.o. female coming in with complaint . Of chronic pain of the hip and back as well as neck. Patient is responding very well to osteopathic manipulation and conservative therapies. Patient is now doing her some and causes a regular basis. Patient is non-unions class and has noticed that she's been doing well and increasing her core strength. Patient does have some difficulty with some mild back pain over the course last week. Patient states that the neck pain is mildly bad as well. No significant new symptoms..   Past medical history, social, surgical and family history all reviewed in electronic medical record.   Review of Systems: No headache, visual changes, nausea, vomiting, diarrhea, constipation, dizziness, abdominal pain, skin rash, fevers, chills, night sweats, weight loss, swollen lymph nodes, body aches, joint swelling, muscle aches, chest pain, shortness of breath, mood changes.   Objective Blood pressure 128/74, pulse 66, height 5' 1.5" (1.562 m), weight 150 lb (68.04 kg), SpO2 99 %.  General: No apparent distress alert and oriented x3 mood and affect normal, dressed appropriately.  HEENT: Pupils equal, extraocular movements intact  Respiratory: Patient's speak in full sentences and does not appear short of breath  Cardiovascular: No lower extremity edema, non tender, no erythema  Skin: Warm dry intact with no signs of infection or rash on extremities or on axial skeleton.  Abdomen: Soft nontender  Neuro: Cranial nerves II through XII are intact, neurovascularly intact in all extremities with 2+ DTRs and 2+ pulses.  Lymph: No lymphadenopathy of posterior or anterior cervical chain or axillae bilaterally.  Gait normal with good balance and coordination.  MSK:  Non tender with full  range of motion and good stability and symmetric strength and tone of shoulders, elbows, wrist, hip, and ankles bilaterally.  Patient's right knee has some mild lateral tracking as well as pain over the superior lateral patella. Neck: Inspection unremarkable. No palpable stepoffs. Negative Spurling's maneuver. Full neck range of motion Grip strength and sensation normal in bilateral hands Strength good C4 to T1 distribution No sensory change to C4 to T1 Negative Hoffman sign bilaterally Reflexes normal Back Exam:  Inspection: Unremarkable  Motion: Flexion 45 deg, Extension 45 deg, Side Bending to 45 deg bilaterally,  Rotation to 45 deg bilaterally  SLR laying: Negative  XSLR laying: Negative  Palpable tenderness: none FABER: negative  Sensory change: Gross sensation intact to all lumbar and sacral dermatomes.  Reflexes: 2+ at both patellar tendons, 2+ at achilles tendons, Babinski's downgoing.  Strength at foot  Plantar-flexion: 5/5 Dorsi-flexion: 5/5 Eversion: 5/5 Inversion: 5/5  Leg strength  Quad: 5/5 Hamstring: 5/5 Hip flexor: 5/5 Hip abductors: 4+/5  Gait unremarkable.   Osteopathic findings Cervical C2 flexed rotated and side bent left C5 flexed rotated and side bent right  Thoracic T3 extended rotated and side bent left inhale third rib T5 extended rotated and side bent left  T8 extended rotated and side bent right  Lumbar L2 flexed rotated and side bent right  Sacrum Left on left      Impression and Recommendations:     This case required medical decision making of moderate complexity.

## 2014-10-19 NOTE — Assessment & Plan Note (Signed)
Overall patient is doing relatively well. We discussed icing protocol and home exercises. We discussed different postural changes that patient can make throughout the day. Patient given some new stretches for some mild knee pain. Patient come back and see me again in  6 weeks for further evaluation and treatment

## 2014-10-19 NOTE — Assessment & Plan Note (Signed)
Decision today to treat with OMT was based on Physical Exam  After verbal consent patient was treated with HVLA, ME techniques in cervical, thoracic, lumbar and sacral areas  Patient tolerated the procedure well with improvement in symptoms  Patient given exercises, stretches and lifestyle modifications  See medications in patient instructions if given  Patient will follow up in 4 weeks   

## 2014-11-10 ENCOUNTER — Telehealth: Payer: Self-pay | Admitting: Family Medicine

## 2014-11-10 NOTE — Telephone Encounter (Signed)
Patient had a reaction to pennsaid. States that she is allergic to NSAID's. She is asking if there is anything else we can give for inflammation

## 2014-11-13 MED ORDER — TRIAMCINOLONE ACETONIDE 0.5 % EX CREA: 1.0000 "application " | TOPICAL_CREAM | Freq: Three times a day (TID) | CUTANEOUS | Status: DC

## 2014-11-13 NOTE — Telephone Encounter (Signed)
Sent in cream for patient to try.  Apologize for me as well.

## 2014-11-13 NOTE — Telephone Encounter (Signed)
Discussed with pt

## 2014-11-16 ENCOUNTER — Other Ambulatory Visit (INDEPENDENT_AMBULATORY_CARE_PROVIDER_SITE_OTHER): Payer: 59

## 2014-11-16 ENCOUNTER — Ambulatory Visit (INDEPENDENT_AMBULATORY_CARE_PROVIDER_SITE_OTHER): Payer: 59 | Admitting: Family Medicine

## 2014-11-16 ENCOUNTER — Telehealth: Payer: Self-pay | Admitting: Family Medicine

## 2014-11-16 ENCOUNTER — Encounter: Payer: Self-pay | Admitting: Family Medicine

## 2014-11-16 VITALS — BP 110/78 | HR 72 | Ht 61.5 in | Wt 154.0 lb

## 2014-11-16 DIAGNOSIS — M25561 Pain in right knee: Secondary | ICD-10-CM

## 2014-11-16 DIAGNOSIS — M769 Unspecified enthesopathy, lower limb, excluding foot: Secondary | ICD-10-CM | POA: Diagnosis not present

## 2014-11-16 DIAGNOSIS — M76899 Other specified enthesopathies of unspecified lower limb, excluding foot: Secondary | ICD-10-CM | POA: Insufficient documentation

## 2014-11-16 MED ORDER — TRAMADOL HCL 50 MG PO TABS: 50.0000 mg | ORAL_TABLET | Freq: Every evening | ORAL | Status: DC | PRN

## 2014-11-16 NOTE — Patient Instructions (Signed)
Quadircep tendonitis and patella contusion.  Thigh compression sleeve Ice 20 minutes 2 times daily. Usually after activity and before bed. Arnica gel or lotion.  Tart cherry extract Vitamin D 4000 IU daily fo next 2 weeks Swimming only for 2 weeks See me again in 3 weeks.

## 2014-11-16 NOTE — Progress Notes (Signed)
Pre visit review using our clinic review tool, if applicable. No additional management support is needed unless otherwise documented below in the visit note. 

## 2014-11-16 NOTE — Telephone Encounter (Signed)
Will fax it  Lillia Abed please!

## 2014-11-16 NOTE — Progress Notes (Signed)
  [name] [address] [place], [place] [address] Phone: [phone] Subjective:     CC: Right knee pain  HYI:FOYDXAJOIN Melanie [name] is a 54 y.o. female coming in with complaint of right knee pain. Patient was seen previously 2014 and was diagnosed with patellofemoral syndrome. Patient states over the course last week she has started having significant worsening of pain over the right knee anteriorly. Patient does not remember any true injury. Discuss it as a dull throbbing aching pain that seems to be worse with activity. Patient is been doing a lot more D&C as well as Zumba recently. States that sometimes he can feel very stiff and difficult bending the knee. Rates the severity of pain a 6 out of 10. Past medical history also significant for anterior cruciate ligament reconstruction on this knee.     Past medical history, social, surgical and family history all reviewed in electronic medical record.   Review of Systems: No headache, visual changes, nausea, vomiting, diarrhea, constipation, dizziness, abdominal pain, skin rash, fevers, chills, night sweats, weight loss, swollen lymph nodes, body aches, joint swelling, muscle aches, chest pain, shortness of breath, mood changes.   Objective Blood pressure 110/78, pulse 72, weight 154 lb (69.854 kg).  General: No apparent distress alert and oriented x3 mood and affect normal, dressed appropriately.  HEENT: Pupils equal, extraocular movements intact  Respiratory: Patient's speak in full sentences and does not appear short of breath  Cardiovascular: No lower extremity edema, non tender, no erythema  Skin: Warm dry intact with no signs of infection or rash on extremities or on axial skeleton.  Abdomen: Soft nontender  Neuro: Cranial nerves II through XII are intact, neurovascularly intact in all extremities with 2+ DTRs and 2+ pulses.  Lymph: No lymphadenopathy of posterior or anterior cervical chain or  axillae bilaterally.  Gait normal with good balance and coordination.  MSK:  Non tender with full range of motion and good stability and symmetric strength and tone of shoulders, elbows, wrist, hip, and ankles bilaterally.  Knee: Right Normal to inspection with no erythema or effusion or obvious bony abnormalities. Incision well-healed Tender to palpation at the insertion of the quadriceps tendon as well as over the anterior aspect of the patella. ROM full in flexion and extension and lower leg rotation. Ligaments with solid consistent endpoints including ACL, PCL, LCL, MCL. Negative Mcmurray's, Apley's, and Thessalonian tests. Painful to palpation over the patella. Patellar glide with minimal crepitus. Patellar and quadriceps tendons unremarkable. Hamstring and quadriceps strength is normal. Pain with resisted extension Contralateral knee unremarkable  MSK US performed of: Right knee This study was ordered, performed, and interpreted by Terrilee Files D.O.  Knee: All structures visualized. Anteromedial, anterolateral, posteromedial, and posterolateral menisci unremarkable without tearing, fraying, effusion, or displacement. Patellar Tendon unremarkable on long and transverse views without effusion. Quadriceps tendon does have significant calcifications noted as well as some mild hypoechoic changes and significant increase in Doppler flow The patella itself has an area of erosion first possible bone reabsorption proximally 1 cm distal to the insertion of the quadriceps. No significant bony cortical defect noted. No abnormality of prepatellar bursa. LCL and MCL unremarkable on long and transverse views. No abnormality of origin of medial or lateral head of the gastrocnemius.  IMPRESSION:  Quadriceps tendinitis with patellar contusion      Impression and Recommendations:     This case required medical decision making of moderate complexity.

## 2014-11-16 NOTE — Assessment & Plan Note (Signed)
Patient does have calcific changes of the quadricep tendon. Patient does have a history of patellar tendinitis previously. Discussed avoiding significant activity such as jumping or her dancing for now. Patient will do more of an icing protocol because she cannot tolerate anti-inflammatory's. We discussed over-the-counter natural topicals a could also be beneficial. Patient will increase her vitamin D. We discussed by compression the can help change the fulcrum of stress. Patient will come back and see me again in 3-4 weeks for further evaluation and treatment.

## 2014-11-16 NOTE — Telephone Encounter (Signed)
Patient states she was in office today.  She is requesting a pain med.  Patient uses [facility].

## 2014-11-30 ENCOUNTER — Ambulatory Visit: Payer: 59 | Admitting: Family Medicine

## 2014-12-07 ENCOUNTER — Ambulatory Visit (INDEPENDENT_AMBULATORY_CARE_PROVIDER_SITE_OTHER): Payer: 59 | Admitting: Family Medicine

## 2014-12-07 ENCOUNTER — Encounter: Payer: Self-pay | Admitting: Family Medicine

## 2014-12-07 VITALS — BP 122/72 | HR 78 | Ht 61.5 in | Wt 155.0 lb

## 2014-12-07 DIAGNOSIS — M9901 Segmental and somatic dysfunction of cervical region: Secondary | ICD-10-CM | POA: Diagnosis not present

## 2014-12-07 DIAGNOSIS — M542 Cervicalgia: Secondary | ICD-10-CM | POA: Diagnosis not present

## 2014-12-07 DIAGNOSIS — M999 Biomechanical lesion, unspecified: Secondary | ICD-10-CM

## 2014-12-07 DIAGNOSIS — M9903 Segmental and somatic dysfunction of lumbar region: Secondary | ICD-10-CM

## 2014-12-07 DIAGNOSIS — M76899 Other specified enthesopathies of unspecified lower limb, excluding foot: Secondary | ICD-10-CM

## 2014-12-07 DIAGNOSIS — M9902 Segmental and somatic dysfunction of thoracic region: Secondary | ICD-10-CM

## 2014-12-07 DIAGNOSIS — M769 Unspecified enthesopathy, lower limb, excluding foot: Secondary | ICD-10-CM

## 2014-12-07 MED ORDER — VITAMIN D (ERGOCALCIFEROL) 1.25 MG (50000 UNIT) PO CAPS: 50000.0000 [IU] | ORAL_CAPSULE | ORAL | Status: DC

## 2014-12-07 NOTE — Progress Notes (Signed)
Melanie ScaleZach [name] D.O. [facility] [address] [place], [place] [address] Phone: [phone] Subjective:     CC: Right knee pain follow-up  NGE:XBMWUXLKGMHPI:Subjective Melanie [name] is a 54 y.o. female coming in with complaint of right knee pain. Patient was seen previously and had more of a quadriceps tendinitis as well as a patellar contusion. Patient does have a past medical history for patellofemoral syndrome. Patient was given home exercises, icing protocol, as well as we discussed compression. Patient states that it does feel better overall. Still has some mild swelling on the anterior the knee. Patient has been swimming but has not been lifting as much as she was doing previously.  Patient also comes back for her regular back pain and neck pain. Patient has responded very well to osteopathic manipulation. Patient  patient states very active she seems to do really well. Patient is long as she stays active seems to do relatively well. Still some mild soreness at the end of a long day.      Past medical history, social, surgical and family history all reviewed in electronic medical record.   Review of Systems: No headache, visual changes, nausea, vomiting, diarrhea, constipation, dizziness, abdominal pain, skin rash, fevers, chills, night sweats, weight loss, swollen lymph nodes, body aches, joint swelling, muscle aches, chest pain, shortness of breath, mood changes.   Objective Blood pressure 122/72, pulse 78, height 5' 1.5" (1.562 m), weight 155 lb (70.308 kg), SpO2 99 %.  General: No apparent distress alert and oriented x3 mood and affect normal, dressed appropriately.  HEENT: Pupils equal, extraocular movements intact  Respiratory: Patient's speak in full sentences and does not appear short of breath  Cardiovascular: No lower extremity edema, non tender, no erythema  Skin: Warm dry intact with no signs of infection or rash on extremities or on axial skeleton.  Abdomen: Soft nontender   Neuro: Cranial nerves II through XII are intact, neurovascularly intact in all extremities with 2+ DTRs and 2+ pulses.  Lymph: No lymphadenopathy of posterior or anterior cervical chain or axillae bilaterally.  Gait normal with good balance and coordination.  MSK:  Non tender with full range of motion and good stability and symmetric strength and tone of shoulders, elbows, wrist, hip, and ankles bilaterally.  Knee: Right Normal to inspection with no erythema or effusion or obvious bony abnormalities. Incision well-healed Continued tenderness at the insertion ROM full in flexion and extension and lower leg rotation. Ligaments with solid consistent endpoints including ACL, PCL, LCL, MCL. Negative Mcmurray's, Apley's, and Thessalonian tests. Painful to palpation over the patella. Patellar glide with minimal crepitus. Patellar and quadriceps tendons unremarkable. Hamstring and quadriceps strength is normal. Pain with resisted extension Contralateral knee unremarkable  MSK US performed of: Right knee This study was ordered, performed, and interpreted by Terrilee FilesZach [name] D.O.  Knee: All structures visualized. Anteromedial, anterolateral, posteromedial, and posterolateral menisci unremarkable without tearing, fraying, effusion, or displacement. Patellar Tendon unremarkable on long and transverse views without effusion. Quadriceps tendon does have significant calcifications noted as well as some mild hypoechoic changes and significant increase in Doppler flow possibly worse than previous exam Erosion seems to be completed No significant bony cortical defect noted. No abnormality of prepatellar bursa. LCL and MCL unremarkable on long and transverse views. No abnormality of origin of medial or lateral head of the gastrocnemius.  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 No palpable stepoffs. Negative Spurling's maneuver. Full neck  range of  motion Grip strength and sensation normal in bilateral hands Strength good C4 to T1 distribution No sensory change to C4 to T1 Negative Hoffman sign bilaterally Reflexes normal Back Exam:  Inspection: Unremarkable  Motion: Flexion 45 deg, Extension 45 deg, Side Bending to 45 deg bilaterally,  Rotation to 45 deg bilaterally  SLR laying: Negative  XSLR laying: Negative  Palpable tenderness: Mild tenderness of the paraspinal musculature of the cervical and thoracic. FABER: negative  Sensory change: Gross sensation intact to all lumbar and sacral dermatomes.  Reflexes: 2+ at both patellar tendons, 2+ at achilles tendons, Babinski's downgoing.  Strength at foot  Plantar-flexion: 5/5 Dorsi-flexion: 5/5 Eversion: 5/5 Inversion: 5/5  Leg strength  Quad: 5/5 Hamstring: 5/5 Hip flexor: 5/5 Hip abductors: 4+/5  Gait unremarkable.   Osteopathic findings Cervical C2 flexed rotated and side bent left C5 flexed rotated and side bent right  Thoracic T3 extended rotated and side bent left inhale third rib T5 extended rotated and side bent left  T8 extended rotated and side bent right  Lumbar L2 flexed rotated and side bent right  Sacrum Left on left    Impression and Recommendations:     This case required medical decision making of moderate complexity.

## 2014-12-07 NOTE — Patient Instructions (Addendum)
You needed the adjustment For the knee PT will call you Ice is your friend for sure toon the knee Try to wear the compression  Sleeve a little bit  More Baby the knee one more week then start lifting Vitamin D 1 time a week now.  See me again in 3 weeks.

## 2014-12-07 NOTE — Progress Notes (Signed)
Pre visit review using our clinic review tool, if applicable. No additional management support is needed unless otherwise documented below in the visit note. 

## 2014-12-08 NOTE — Assessment & Plan Note (Signed)
Decision today to treat with OMT was based on Physical Exam  After verbal consent patient was treated with HVLA, ME techniques in cervical, thoracic, lumbar and sacral areas  Patient tolerated the procedure well with improvement in symptoms  Patient given exercises, stretches and lifestyle modifications  See medications in patient instructions if given  Patient will follow up in 4 weeks   

## 2014-12-08 NOTE — Assessment & Plan Note (Signed)
Patient did have some mild tightness a day but overall still responded well to osteopathic manipulation. Encourage her to do more of the core strengthening as well as the postural changes. Discussed ergonomics at work. Patient continues to respond well to osteopathic manipulation will come back again in 3-4 weeks for further evaluation and treatment

## 2014-12-08 NOTE — Assessment & Plan Note (Signed)
Aching continues to have significant calcific tendinitis. We discussed with patient about different treatment options. Patient is going to stop her calcium supplementation and we will do weekly vitamin D to see if this will be beneficial. We discussed the possibility of labs including autoimmune and PTH which patient declined today. Patient continues to have symptoms overall no significant healing on imaging we may want to consider this in the future. Encourage patient to continue compression as well as icing regimen and home exercises. Patient will be referred to formal physical therapy as well. Patient will come back again in 3-4 weeks for further evaluation and treatment.

## 2014-12-28 ENCOUNTER — Ambulatory Visit: Payer: 59 | Attending: Family Medicine | Admitting: Physical Therapy

## 2014-12-28 DIAGNOSIS — R29898 Other symptoms and signs involving the musculoskeletal system: Secondary | ICD-10-CM | POA: Insufficient documentation

## 2014-12-28 DIAGNOSIS — M25561 Pain in right knee: Secondary | ICD-10-CM | POA: Diagnosis present

## 2014-12-28 DIAGNOSIS — M6289 Other specified disorders of muscle: Secondary | ICD-10-CM | POA: Diagnosis present

## 2014-12-28 NOTE — Therapy (Signed)
[facility] [address] [place], [place], [address] Phone: [phone]   Fax:  [phone]  Physical Therapy Evaluation  Patient Details  Name: Melanie [name] MRN: [id number] Date of Birth: 1960/09/25 Referring Provider:  Judi Saa, DO  Encounter Date: 12/28/2014      PT End of Session - 12/28/14 1739    Visit Number 1   Number of Visits 12   Date for PT Re-Evaluation 02/08/15   PT Start Time 1545   PT Stop Time 1630   PT Time Calculation (min) 45 min   Activity Tolerance Patient tolerated treatment well   Behavior During Therapy [facility] for tasks assessed/performed      Past Medical History  Diagnosis Date  . Allergy   . Asthma   . Osteoporosis     Past Surgical History  Procedure Laterality Date  . Appendectomy    . Abdominal hysterectomy    . Knee surgery      There were no vitals filed for this visit.  Visit Diagnosis:  Right knee pain - Plan: PT plan of care cert/re-cert  Weakness of both hips - Plan: PT plan of care cert/re-cert  Weakness of right lower extremity - Plan: PT plan of care cert/re-cert      Subjective Assessment - 12/28/14 1549    Subjective pt is a 54 y.o F with CC of R quadricep tendinitis that has been going on for about a month, She reports remember hitting her quad on her bathroom cabinet but is unsure if that was the cause of the problems. She reports it feeling like its getting better since onset.    Limitations --  squatting   How long can you sit comfortably? 1-2 hours   How long can you stand comfortably? 1-2 hours   How long can you walk comfortably? 30 min   Diagnostic tests 11/21/2014 [place] on the quad tendin finding calcific deposits   Patient Stated Goals to get strength back, to get function back.   Currently in Pain? Yes   Pain Score 1   squatting 5/10   Pain Location Knee   Pain Orientation Right   Pain Descriptors / Indicators Tightness;Aching   Pain Type  Chronic pain   Pain Onset 1 to 4 weeks ago   Pain Frequency Intermittent   Aggravating Factors  squatting,    Pain Relieving Factors Ice, arnica            [facility] PT Assessment - 12/28/14 1556    Assessment   Medical Diagnosis R quadricep tendinitis   Onset Date/Surgical Date --  1 month   Hand Dominance Left   Next MD Visit 01/05/2015   Prior Therapy yes  R knee, back   Precautions   Precautions None   Restrictions   Other Position/Activity Restrictions no Zumba    Balance Screen   Has the patient fallen in the past 6 months Yes   How many times? 1   Has the patient had a decrease in activity level because of a fear of falling?  No   Is the patient reluctant to leave their home because of a fear of falling?  No   Home Environment   Living Environment Private residence   Living Arrangements Alone   Type of Home House   Home Access Stairs to enter   Entrance Stairs-Number of Steps 2   Entrance Stairs-Rails Right   Home Layout One level   Home Equipment --  patellar  stabilizer and compression sleeve   Prior Function   Level of Independence Independent;Independent with basic ADLs   Vocation Full time employment  project specialist   Vocation Requirements prolonged sitting, standing   Leisure aquatic therapy,dancing   Cognition   Overall Cognitive Status Within Functional Limits for tasks assessed   Observation/Other Assessments   Focus on Therapeutic Outcomes (FOTO)  49% limited  predicted 37%   Posture/Postural Control   Posture/Postural Control Postural limitations   Postural Limitations Rounded Shoulders;Forward head   ROM / Strength   AROM / PROM / Strength AROM;Strength;PROM   AROM   AROM Assessment Site Knee   Right/Left Knee Right;Left   Right Knee Extension 0   Right Knee Flexion 135   Left Knee Extension 0   Left Knee Flexion 135   PROM   PROM Assessment Site Knee   Right/Left Knee Right;Left   Strength   Strength Assessment Site Knee;Hip    Right/Left Hip Right;Left   Right Hip Flexion 5/5   Right Hip Extension 4-/5   Right Hip ABduction 4-/5   Right Hip ADduction 4+/5   Left Hip Flexion 4+/5   Left Hip Extension 4-/5   Left Hip ABduction 4-/5   Left Hip ADduction 5/5   Right/Left Knee Right;Left   Right Knee Flexion 4-/5  pain in the front of the knee   Right Knee Extension 4/5   Left Knee Flexion 5/5   Left Knee Extension 5/5   Palpation   Patella mobility hypomobility in all planes   Palpation comment tenderness at R quadricep tendon into the base of the patella.    Special Tests    Special Tests Knee Special Tests   Knee Special tests  Patellofemoral Grind Test (Clarke's Sign);Step-up/Step Down Test;Patellofemoral Apprehension Test   Patellofemoral Apprehension Test    Findings Negative   Step-up/Step Down    Findings Positive   Side  Right   Patellofemoral Grind test (Clark's Sign)   Findings Postive   Side  Right   Ambulation/Gait   Gait Pattern Step-through pattern                   [facility] Adult PT Treatment/Exercise - 12/28/14 1556    Exercises   Exercises Knee/Hip   Knee/Hip Exercises: Supine   Straight Leg Raise with External Rotation AROM;Strengthening;Right;1 set;10 reps  VC to sustain quadricep contraction                PT Education - 12/28/14 1739    Education provided Yes   Education Details evaluation findings, POC, Goals, HEP anatomical education   Person(s) Educated Patient   Methods Explanation   Comprehension Verbalized understanding          PT Short Term Goals - 12/28/14 1745    PT SHORT TERM GOAL #1   Title pt will be I with basic HEP (01/18/2015)   Time 3   Period Weeks   Status New   PT SHORT TERM GOAL #2   Title She will be able to verbalize and demonstrate techniques to reduce R knee inflammation via RICE method (01/18/2015)   Time 3   Period Weeks   Status New           PT Long Term Goals - 12/28/14 1746    PT LONG TERM GOAL #1   Title  Upon discharge pt will be I with all HEP given throughout therapy (02/08/2015)   Time 6   Period Weeks   Status  New   PT LONG TERM GOAL #2   Title She will demonstrate <2/10 pain during /following squatting activities to assist with ADLs (02/08/2015)   Time 6   Period Weeks   Status New   PT LONG TERM GOAL #3   Title pt will demonstate >4+/5 R quad/hamstring strength for endurance with walking/standing activities (02/08/2015)   Time 6   Period Weeks   Status New   PT LONG TERM GOAL #4   Title pt will be able to perform "zumba" dance with <2/10 pain to assist with personal goal of being able to attend zumba and other dance classes (02/08/2015)   Time 6   Period Weeks   Status New   PT LONG TERM GOAL #5   Title She will increase her FOTO score to > 63 to demonstrate improved functional capacity upon discharge (02/08/2015)   Baseline inital 32   Time 6   Period Weeks   Status New               Plan - 12/28/14 1740    Clinical Impression Statement Yamilee presents to OPPT with CC or R quadricep tendinitis that started about a month ago. She demonstrates R knee AROM WFL and mild weakness in the  compared bil, hip glute strenght is limited with increased weakness in bil abductors. Palpation reveals tenderness along the quad tendon, and exhibits hypermobility of the patella with poor tracking of the patella during quad contraction with decrease muscle bulk in the VMO. she has a positive step down test with medial collapse which when correct she reported no pain. she would benefit from skilled physical therapy to maximize her funciton and decrease pain by addressing the impairments listed.    Pt will benefit from skilled therapeutic intervention in order to improve on the following deficits Decreased activity tolerance;Decreased endurance;Decreased strength;Hypermobility;Pain   Rehab Potential Good   PT Frequency 2x / week   PT Duration 6 weeks   PT Treatment/Interventions ADLs/Self Care Home  Management;Cryotherapy;Electrical Stimulation;Iontophoresis 4mg /ml Dexamethasone;Moist Heat;Ultrasound;Therapeutic activities;Therapeutic exercise;Neuromuscular re-education;Balance training;Patient/family education;Manual techniques;Dry needling;Passive range of motion;Taping   PT Next Visit Plan assess response to HEP, quad stretching and strengthening, Modalities ([place] over quad tendon), taping?   PT Home Exercise Plan See HEP handout   Consulted and Agree with Plan of Care Patient         Problem List Patient Active Problem List   Diagnosis Date Noted  . Quadriceps tendinitis 11/16/2014  . Neck pain 07/07/2014  . Concussion with no loss of consciousness 07/07/2014  . Nonallopathic lesion of lumbosacral region 11/04/2013  . Nonallopathic lesion of thoracic region 11/04/2013  . Piriformis syndrome of left side 10/14/2013  . Greater trochanteric bursitis of left hip 10/14/2013  . Trapezius muscle spasm 11/26/2012  . Nonallopathic lesion of cervicothoracic region 11/26/2012  . Seasonal allergies 09/28/2012  . Asthma attack 12/01/2011  . Backache 09/26/2009  . OTHER ACQUIRED DEFORMITY OF ANKLE AND FOOT OTHER 09/26/2009  . ABNORMALITY OF GAIT 09/26/2009  . KNEE PAIN, RIGHT 08/08/2009  . TENDINITIS, PATELLAR 08/08/2009   Lulu Riding PT, DPT, LAT, ATC  12/28/2014  5:57 PM    [facility] [address] [place], [place], [address] Phone: [phone]   Fax:  [phone]

## 2014-12-28 NOTE — Patient Instructions (Signed)
   Leonard Feigel PT, DPT, LAT, ATC  [facility] Phone: [phone]     

## 2015-01-01 ENCOUNTER — Ambulatory Visit: Payer: 59 | Admitting: Physical Therapy

## 2015-01-01 DIAGNOSIS — R29898 Other symptoms and signs involving the musculoskeletal system: Secondary | ICD-10-CM

## 2015-01-01 DIAGNOSIS — M25561 Pain in right knee: Secondary | ICD-10-CM

## 2015-01-01 NOTE — Therapy (Signed)
[facility] Outpatient Rehabilitation [facility] [address] [place], [place], [address] Phone: [phone]   Fax:  [phone]  Physical Therapy Treatment  Patient Details  Name: Melanie [name] MRN: [id number] Date of Birth: 12-19-1960 Referring Provider:  Dorothyann Peng, MD  Encounter Date: 01/01/2015      PT End of Session - 01/01/15 1034    Visit Number 2   Number of Visits 12   Date for PT Re-Evaluation 02/08/15   PT Start Time 0933   PT Stop Time 1025   PT Time Calculation (min) 52 min   Activity Tolerance Patient tolerated treatment well   Behavior During Therapy [facility] for tasks assessed/performed      Past Medical History  Diagnosis Date  . Allergy   . Asthma   . Osteoporosis     Past Surgical History  Procedure Laterality Date  . Appendectomy    . Abdominal hysterectomy    . Knee surgery      There were no vitals filed for this visit.  Visit Diagnosis:  Right knee pain  Weakness of right lower extremity      Subjective Assessment - 01/01/15 0940    Subjective mild pain this am with twist.  None now.  Has been doing her home exercises.  Noticed hip abduction exercises were harder.  I'm weak, I didn't realize.    RT knee pain , just a twinge. Brief.                      [facility] Adult PT Treatment/Exercise - 01/01/15 0939    Self-Care   Self-Care --  pain free exercises   Modalities   Modalities Ultrasound   Ultrasound   Ultrasound Location Knee Rt   Ultrasound Parameters 100% , .8 watts/cm2   Ultrasound Goals Pain  calcium break up   Manual Therapy   Manual Therapy Edema management;Soft tissue mobilization;Myofascial release   Soft tissue mobilization Tool used distal quads and tendon   Myofascial Release tool used distal quads   Kinesiotex Edema;Inhibit Muscle;Facilitate Muscle   Kinesiotix   Edema medial thigh, fan post retrograde   Inhibit Muscle  anterior leg, "Y"   Facilitate Muscle  quad "Y"    Self  care also included taping instruction as it was being applied.              PT Short Term Goals - 12/28/14 1745    PT SHORT TERM GOAL #1   Title pt will be I with basic HEP (01/18/2015)   Time 3   Period Weeks   Status New   PT SHORT TERM GOAL #2   Title She will be able to verbalize and demonstrate techniques to reduce R knee inflammation via RICE method (01/18/2015)   Time 3   Period Weeks   Status New           PT Long Term Goals - 12/28/14 1746    PT LONG TERM GOAL #1   Title Upon discharge pt will be I with all HEP given throughout therapy (02/08/2015)   Time 6   Period Weeks   Status New   PT LONG TERM GOAL #2   Title She will demonstrate <2/10 pain during /following squatting activities to assist with ADLs (02/08/2015)   Time 6   Period Weeks   Status New   PT LONG TERM GOAL #3   Title pt will demonstate >4+/5 R quad/hamstring strength for endurance with walking/standing activities (02/08/2015)  Time 6   Period Weeks   Status New   PT LONG TERM GOAL #4   Title pt will be able to perform "zumba" dance with <2/10 pain to assist with personal goal of being able to attend zumba and other dance classes (02/08/2015)   Time 6   Period Weeks   Status New   PT LONG TERM GOAL #5   Title She will increase her FOTO score to > 63 to demonstrate improved functional capacity upon discharge (02/08/2015)   Baseline inital 32   Time 6   Period Weeks   Status New               Plan - 01/01/15 1035    Clinical Impression Statement Focus on decreasing pain imflammation and edema.  Patient is allergic to latex so used kinesiotex tape.  She is interested in learning how to tape herself.She has shoe inserts which she did not wear today.     PT Next Visit Plan [place] over quad tendon, teach tape technique if she still wants.  Quad sets, gluteal med.  Stretches calf, quads hip. painfree   Consulted and Agree with Plan of Care Patient        Problem List Patient Active  Problem List   Diagnosis Date Noted  . Quadriceps tendinitis 11/16/2014  . Neck pain 07/07/2014  . Concussion with no loss of consciousness 07/07/2014  . Nonallopathic lesion of lumbosacral region 11/04/2013  . Nonallopathic lesion of thoracic region 11/04/2013  . Piriformis syndrome of left side 10/14/2013  . Greater trochanteric bursitis of left hip 10/14/2013  . Trapezius muscle spasm 11/26/2012  . Nonallopathic lesion of cervicothoracic region 11/26/2012  . Seasonal allergies 09/28/2012  . Asthma attack 12/01/2011  . Backache 09/26/2009  . OTHER ACQUIRED DEFORMITY OF ANKLE AND FOOT OTHER 09/26/2009  . ABNORMALITY OF GAIT 09/26/2009  . KNEE PAIN, RIGHT 08/08/2009  . TENDINITIS, PATELLAR 08/08/2009    [facility] 01/01/2015, 10:39 AM  [facility] [address] [place], [place], [address] Phone: [phone]   Fax:  [phone]     Liz Beach, PTA 01/01/2015 10:39 AM Phone: [phone] Fax: [phone]

## 2015-01-01 NOTE — Patient Instructions (Signed)
Remove tape if irritating.   Patch tape vs trim on Y's.  Loel Dubonnet can be trimmed. May wear in pool. Exercise/activities all to be pain free.  Ice 2 X a day and post exercise.

## 2015-01-04 ENCOUNTER — Encounter: Payer: Self-pay | Admitting: Family Medicine

## 2015-01-04 ENCOUNTER — Ambulatory Visit (INDEPENDENT_AMBULATORY_CARE_PROVIDER_SITE_OTHER): Payer: 59 | Admitting: Family Medicine

## 2015-01-04 VITALS — BP 120/74 | HR 87 | Ht 61.5 in | Wt 156.0 lb

## 2015-01-04 DIAGNOSIS — M9902 Segmental and somatic dysfunction of thoracic region: Secondary | ICD-10-CM | POA: Diagnosis not present

## 2015-01-04 DIAGNOSIS — M9901 Segmental and somatic dysfunction of cervical region: Secondary | ICD-10-CM | POA: Diagnosis not present

## 2015-01-04 DIAGNOSIS — M76899 Other specified enthesopathies of unspecified lower limb, excluding foot: Secondary | ICD-10-CM

## 2015-01-04 DIAGNOSIS — M542 Cervicalgia: Secondary | ICD-10-CM

## 2015-01-04 DIAGNOSIS — M9903 Segmental and somatic dysfunction of lumbar region: Secondary | ICD-10-CM

## 2015-01-04 DIAGNOSIS — M999 Biomechanical lesion, unspecified: Secondary | ICD-10-CM

## 2015-01-04 DIAGNOSIS — M769 Unspecified enthesopathy, lower limb, excluding foot: Secondary | ICD-10-CM

## 2015-01-04 MED ORDER — VITAMIN D (ERGOCALCIFEROL) 1.25 MG (50000 UNIT) PO CAPS: 50000.0000 [IU] | ORAL_CAPSULE | ORAL | Status: DC

## 2015-01-04 NOTE — Assessment & Plan Note (Signed)
Patient overall is doing fairly well. Patient is compensating for her knee pain though that I think is giving her the backache as well as the neck pain. Patient will continue the postural changes. Patient continues to respond fairly well to osteopathic manipulation. Patient come back in 4-6 weeks for further evaluation and treatment.

## 2015-01-04 NOTE — Assessment & Plan Note (Signed)
Significant decrease in the calcium deposits. Patient will continue to hold on the calcium supplementation will continue with vitamin D supplementation high-dose once weekly for another 3 months. Patient will continue with formal physical therapy. We discussed continuing the compression. Patient will start increasing her activity slowly. Patient come back in 4-6 weeks.

## 2015-01-04 NOTE — Progress Notes (Signed)
[name] [address] [place], [place] [address] Phone: [phone] Subjective:     CC: Right knee pain follow-up  [id number] Melanie [name] is a 54 y.o. female coming in with complaint of right knee pain. Patient was seen previously and had more of a quadriceps tendinitis as well as a patellar contusion. Patient does have a past medical history for patellofemoral syndrome. Patient was given home exercises, icing protocol, as well as we discussed compression. Patient was making improvement at last follow-up. Patient was to start increasing her activity. Patient states her knee is feeling better. Patient has done physical therapy. Patient has gone 2 times and feels that it is beneficial. Patient states that she has been working out somewhat but not as intensely as he was previously. Has noticed improvement would state about 30% better. The vitamin D regularly.  Patient also comes back for her regular back pain and neck pain. Patient has noticed some increasing discomfort since she has not been as active. Patient also still compensating for her knee. Patient states that it seems to be more upper and mid back. Not as much of the lower back..      Past medical history, social, surgical and family history all reviewed in electronic medical record.   Review of Systems: No headache, visual changes, nausea, vomiting, diarrhea, constipation, dizziness, abdominal pain, skin rash, fevers, chills, night sweats, weight loss, swollen lymph nodes, body aches, joint swelling, muscle aches, chest pain, shortness of breath, mood changes.   Objective Blood pressure 120/74, pulse 87, height 5' 1.5" (1.562 m), weight 156 lb (70.761 kg), SpO2 97 %.  General: No apparent distress alert and oriented x3 mood and affect normal, dressed appropriately.  HEENT: Pupils equal, extraocular movements intact  Respiratory: Patient's speak in full sentences and does not appear short of  breath  Cardiovascular: No lower extremity edema, non tender, no erythema  Skin: Warm dry intact with no signs of infection or rash on extremities or on axial skeleton.  Abdomen: Soft nontender  Neuro: Cranial nerves II through XII are intact, neurovascularly intact in all extremities with 2+ DTRs and 2+ pulses.  Lymph: No lymphadenopathy of posterior or anterior cervical chain or axillae bilaterally.  Gait normal with good balance and coordination.  MSK:  Non tender with full range of motion and good stability and symmetric strength and tone of shoulders, elbows, wrist, hip, and ankles bilaterally.  Knee: Right Normal to inspection with no erythema or effusion or obvious bony abnormalities. Incision well-healed Significant less tender than previous exam at the insertion of the quadriceps tendon. ROM full in flexion and extension and lower leg rotation. Ligaments with solid consistent endpoints including ACL, PCL, LCL, MCL. Negative Mcmurray's, Apley's, and Thessalonian tests. Painful to palpation over the patella. Patellar glide with minimal crepitus. Patellar and quadriceps tendons unremarkable. Hamstring and quadriceps strength is normal. Pain with resisted extension Contralateral knee unremarkable  MSK US performed of: Right knee This study was ordered, performed, and interpreted by Terrilee Files D.O.  Knee: All structures visualized. Anteromedial, anterolateral, posteromedial, and posterolateral menisci unremarkable without tearing, fraying, effusion, or displacement. Patellar Tendon unremarkable on long and transverse views without effusion. Quadriceps tendon significant decrease in calcific changes of his noted previously. Still some mild hypoechoic changes. Erosion seems to be completed No significant bony cortical defect noted. No abnormality of prepatellar bursa. LCL and MCL unremarkable on long and transverse views. No abnormality of origin of medial or lateral head of  the  gastrocnemius.  IMPRESSION:  Improved quadriceps tendinitis  Neck: Inspection unremarkable. No palpable stepoffs. Negative Spurling's maneuver. Full neck range of motion Grip strength and sensation normal in bilateral hands Strength good C4 to T1 distribution No sensory change to C4 to T1 Negative Hoffman sign bilaterally Reflexes normal Back Exam:  Inspection: Unremarkable  Motion: Flexion 45 deg, Extension 45 deg, Side Bending to 45 deg bilaterally,  Rotation to 45 deg bilaterally  SLR laying: Negative  XSLR laying: Negative  Palpable tenderness: Mild tenderness of the paraspinal musculature of the cervical and thoracic. FABER: negative  Sensory change: Gross sensation intact to all lumbar and sacral dermatomes.  Reflexes: 2+ at both patellar tendons, 2+ at achilles tendons, Babinski's downgoing.  Strength at foot  Plantar-flexion: 5/5 Dorsi-flexion: 5/5 Eversion: 5/5 Inversion: 5/5  Leg strength  Quad: 5/5 Hamstring: 5/5 Hip flexor: 5/5 Hip abductors: 4+/5  Gait unremarkable.   Osteopathic findings Cervical C2 flexed rotated and side bent left  Thoracic T3 extended rotated and side bent left inhale third rib T8 extended rotated and side bent right  Lumbar L2 flexed rotated and side bent right  Sacrum Left on left    Impression and Recommendations:     This case required medical decision making of moderate complexity.

## 2015-01-04 NOTE — Patient Instructions (Addendum)
Good to see you Ok to do a little biking We will do vitamin for 1 more round Continue with PT See me again in 4-6 weeks.

## 2015-01-04 NOTE — Assessment & Plan Note (Signed)
Decision today to treat with OMT was based on Physical Exam  After verbal consent patient was treated with HVLA, ME techniques in cervical, thoracic, lumbar and sacral areas  Patient tolerated the procedure well with improvement in symptoms  Patient given exercises, stretches and lifestyle modifications  See medications in patient instructions if given  Patient will follow up in 4 weeks   

## 2015-01-04 NOTE — Progress Notes (Signed)
Pre visit review using our clinic review tool, if applicable. No additional management support is needed unless otherwise documented below in the visit note. 

## 2015-01-16 ENCOUNTER — Ambulatory Visit: Payer: 59 | Attending: Family Medicine | Admitting: Physical Therapy

## 2015-01-16 DIAGNOSIS — R29898 Other symptoms and signs involving the musculoskeletal system: Secondary | ICD-10-CM | POA: Insufficient documentation

## 2015-01-16 DIAGNOSIS — M6289 Other specified disorders of muscle: Secondary | ICD-10-CM | POA: Insufficient documentation

## 2015-01-16 DIAGNOSIS — M25561 Pain in right knee: Secondary | ICD-10-CM | POA: Insufficient documentation

## 2015-01-16 NOTE — Therapy (Signed)
[place], [place], [address] Phone: [phone]   Fax:  [phone]  Physical Therapy Treatment  Patient Details  Name: Melanie [name] MRN: [id number] Date of Birth: 1961/03/17 Referring Provider:  Glendale Chard, MD  Encounter Date: 01/16/2015      PT End of Session - 01/16/15 1635    Visit Number 3   Number of Visits 12   Date for PT Re-Evaluation 02/08/15   PT Start Time 0433   PT Stop Time 0518   PT Time Calculation (min) 45 min      Past Medical History  Diagnosis Date  . Allergy   . Asthma   . Osteoporosis     Past Surgical History  Procedure Laterality Date  . Appendectomy    . Abdominal hysterectomy    . Knee surgery      There were no vitals filed for this visit.  Visit Diagnosis:  Right knee pain  Weakness of right lower extremity  Weakness of both hips      Subjective Assessment - 01/16/15 1635    Subjective I had some skin discoloration from the tape and it kept rolling off. The ultrasound helped alot. I have been doing my exercises.    Currently in Pain? Yes   Pain Score 5    Pain Location Knee   Pain Orientation Right   Pain Descriptors / Indicators Aching   Pain Type Chronic pain   Aggravating Factors  rainy weather   Pain Relieving Factors ice                         [facility] Adult PT Treatment/Exercise - 01/16/15 1645    Knee/Hip Exercises: Stretches   Sports administrator 3 reps;30 seconds   Quad Stretch Limitations sidelying pulling foot to buttock with hip flexor stretch  bilateral   Gastroc Stretch 3 reps;30 seconds   Gastroc Stretch Limitations edge of step   Knee/Hip Exercises: Supine   Quad Sets 10 reps   Straight Leg Raise with External Rotation AROM;Strengthening;Right;2 sets;10 reps  VC to sustain quadricep contraction   Knee/Hip Exercises: Sidelying   Hip ABduction AROM;3 sets;10 reps   Clams x 10 bilateral   Knee/Hip Exercises: Prone    Hip Extension AROM;2 sets;10 reps   Modalities   Modalities Ultrasound   Ultrasound   Ultrasound Location knee rt   Ultrasound Parameters 100% .8w/cm2   Ultrasound Goals Pain                PT Education - 01/16/15 1702    Education provided Yes   Education Details Side lying abduction, extension, supine quad sets, calf stretch and quad stretch   Person(s) Educated Patient   Methods Explanation;Handout   Comprehension Verbalized understanding          PT Short Term Goals - 01/16/15 1636    PT SHORT TERM GOAL #1   Title pt will be I with basic HEP (01/18/2015)   Time 3   Period Weeks   Status Achieved   PT SHORT TERM GOAL #2   Title She will be able to verbalize and demonstrate techniques to reduce R knee inflammation via RICE method (01/18/2015)   Time 3   Period Weeks   Status Achieved           PT Long Term Goals - 12/28/14 1746    PT LONG TERM GOAL #1   Title Upon discharge pt will  be I with all HEP given throughout therapy (02/08/2015)   Time 6   Period Weeks   Status New   PT LONG TERM GOAL #2   Title She will demonstrate <2/10 pain during /following squatting activities to assist with ADLs (02/08/2015)   Time 6   Period Weeks   Status New   PT LONG TERM GOAL #3   Title pt will demonstate >4+/5 R quad/hamstring strength for endurance with walking/standing activities (02/08/2015)   Time 6   Period Weeks   Status New   PT LONG TERM GOAL #4   Title pt will be able to perform "zumba" dance with <2/10 pain to assist with personal goal of being able to attend zumba and other dance classes (02/08/2015)   Time 6   Period Weeks   Status New   PT LONG TERM GOAL #5   Title She will increase her FOTO score to > 63 to demonstrate improved functional capacity upon discharge (02/08/2015)   Baseline inital 32   Time 6   Period Weeks   Status New               Plan - 01/16/15 1641    Clinical Impression Statement STG# 1, 2 Met. Progressing toward strength  goals. Advanced hip strengthening and added calf/quad stretch to HEP   PT Next Visit Plan [place] over quad tendon,Quad sets, gluteal med.  Stretches calf, quads hip. painfree        Problem List Patient Active Problem List   Diagnosis Date Noted  . Quadriceps tendinitis 11/16/2014  . Neck pain 07/07/2014  . Concussion with no loss of consciousness 07/07/2014  . Nonallopathic lesion of lumbosacral region 11/04/2013  . Nonallopathic lesion of thoracic region 11/04/2013  . Piriformis syndrome of left side 10/14/2013  . Greater trochanteric bursitis of left hip 10/14/2013  . Trapezius muscle spasm 11/26/2012  . Nonallopathic lesion of cervicothoracic region 11/26/2012  . Seasonal allergies 09/28/2012  . Asthma attack 12/01/2011  . Backache 09/26/2009  . OTHER ACQUIRED DEFORMITY OF ANKLE AND FOOT OTHER 09/26/2009  . ABNORMALITY OF GAIT 09/26/2009  . KNEE PAIN, RIGHT 08/08/2009  . TENDINITIS, PATELLAR 08/08/2009    Dorene Ar, PTA 01/16/2015, 5:20 PM  [facility] [address] [place], [place], [address] Phone: [phone]   Fax:  [phone]

## 2015-01-16 NOTE — Patient Instructions (Signed)
Hip Extension (Prone)   Lift left leg __6__ inches from floor, keeping knee locked. Repeat _10___ times per set. Do _2___ sets per session. Do __2__ sessions per day.  http://orth.exer.us/98   Copyright  VHI. All rights reserved.  Abduction: Side Leg Lift (Eccentric) - Side-Lying   Lie on side. Lift top leg slightly higher than shoulder level. Keep top leg straight with body, toes pointing forward. Slowly lower for 3-5 seconds. _10__ reps per set, _2__ sets per day, 7___ days per week. Gastroc / Heel Cord Stretch - On Step   Stand with heels over edge of stair. Holding rail, lower heels until stretch is felt in calf of legs. Hold 90 seconds each side. Repeat _0__ times. Do _2__ times per day.  Copyright  VHI. All rights reserved.   Quadriceps Stretch   Pull left heel in toward buttocks until a comfortable stretch is felt in front of thigh. Hold __90__ seconds. Repeat _0___ times per set. Do __1__ sets per session. Do __2__ sessions per day.  http://orth.exer.us/155   Copyright  VHI. All rights reserved.   Quad Set   With other leg bent, foot flat, slowly tighten muscles on thigh of straight leg while counting out loud to _5___. Repeat with other leg. Repeat __20__ times. Do _2___ sessions per day.  http://gt2.exer.us/276   Copyright  VHI. All rights reserved.

## 2015-01-23 ENCOUNTER — Ambulatory Visit: Payer: 59 | Admitting: Physical Therapy

## 2015-01-29 ENCOUNTER — Ambulatory Visit: Payer: 59 | Admitting: Physical Therapy

## 2015-01-29 DIAGNOSIS — M25561 Pain in right knee: Secondary | ICD-10-CM

## 2015-01-29 DIAGNOSIS — R29898 Other symptoms and signs involving the musculoskeletal system: Secondary | ICD-10-CM

## 2015-01-29 NOTE — Therapy (Signed)
[facility] [address] [place], [place], [address] Phone: [phone]   Fax:  [phone]  Physical Therapy Treatment  Patient Details  Name: Melanie [name] MRN: [id number] Date of Birth: [birth year] Referring Provider:  Dorothyann Peng, MD  Encounter Date: 01/29/2015      PT End of Session - 01/29/15 1735    Visit Number 4   Number of Visits 12   Date for PT Re-Evaluation 02/08/15   PT Start Time 1630   PT Stop Time 1715   PT Time Calculation (min) 45 min   Activity Tolerance Patient tolerated treatment well   Behavior During Therapy [facility] for tasks assessed/performed      Past Medical History  Diagnosis Date  . Allergy   . Asthma   . Osteoporosis     Past Surgical History  Procedure Laterality Date  . Appendectomy    . Abdominal hysterectomy    . Knee surgery      There were no vitals filed for this visit.  Visit Diagnosis:  Right knee pain  Weakness of right lower extremity  Weakness of both hips      Subjective Assessment - 01/29/15 1641    Subjective "I've been pretty good, but really don't like doing the clam shells"   Currently in Pain? Yes   Pain Score 1    Pain Location Knee   Pain Orientation Right   Pain Descriptors / Indicators Aching   Pain Type Chronic pain   Pain Onset More than a month ago   Pain Frequency Intermittent   Aggravating Factors  rainy weather   Pain Relieving Factors ice                         [facility] Adult PT Treatment/Exercise - 01/29/15 1643    Knee/Hip Exercises: Stretches   Quad Stretch 30 seconds;2 reps   Gastroc Stretch 3 reps;30 seconds   Gastroc Stretch Limitations edge of step   Knee/Hip Exercises: Aerobic   Nustep L7 x 6 min   Knee/Hip Exercises: Standing   Forward Step Up 2 sets;Step Height: 4";10 reps   Step Down Both;10 reps;2 sets;Step Height: 4"   Other Standing Knee Exercises standing hip abduction/extension 2 x 15  3# each   Knee/Hip Exercises: Supine   Straight Leg Raise with External Rotation AROM;Strengthening;Right;2 sets;10 reps  3#   Other Supine Knee/Hip Exercises reformer leg press with foot work 2 red 1 blue spring 2 x 10 each. Single leg press with other leg in table top position x 12 ea with 2 red, bridge with both feet on bar with 1 green, 2 red, and 1 blue  2 x 5.  VC for proper breathing                PT Education - 01/29/15 1734    Education provided Yes   Education Details updated HEP, education on reformer   Person(s) Educated Patient   Methods Explanation   Comprehension Verbalized understanding          PT Short Term Goals - 01/16/15 1636    PT SHORT TERM GOAL #1   Title pt will be I with basic HEP (01/18/2015)   Time 3   Period Weeks   Status Achieved   PT SHORT TERM GOAL #2   Title She will be able to verbalize and demonstrate techniques to reduce R knee inflammation via RICE method (01/18/2015)   Time 3  Period Weeks   Status Achieved           PT Long Term Goals - 01/29/15 1737    PT LONG TERM GOAL #1   Title Upon discharge pt will be I with all HEP given throughout therapy (02/08/2015)   Time 6   Period Weeks   Status On-going   PT LONG TERM GOAL #2   Title She will demonstrate <2/10 pain during /following squatting activities to assist with ADLs (02/08/2015)   Time 6   Period Weeks   Status On-going   PT LONG TERM GOAL #3   Title pt will demonstate >4+/5 R quad/hamstring strength for endurance with walking/standing activities (02/08/2015)   Time 6   Period Weeks   Status On-going   PT LONG TERM GOAL #4   Title pt will be able to perform "zumba" dance with <2/10 pain to assist with personal goal of being able to attend zumba and other dance classes (02/08/2015)   Period Weeks   Status On-going   PT LONG TERM GOAL #5   Title She will increase her FOTO score to > 63 to demonstrate improved functional capacity upon discharge (02/08/2015)   Baseline inital 32    Time 6   Period Weeks   Status On-going               Plan - 01/29/15 1735    Clinical Impression Statement Melanie [name] continues to make great progress with decreased intermittent a pain in the R knee and increased strength per pt report. She performed all exercises and had no complaint of pain except for feeling fatigue in the hips with standing exercises. pt reported that she loved the reformer work and could tell it really worked her but she had no pain.    PT Next Visit Plan [place] over quad tendon,Quad sets, gluteal med.  Stretches calf, quads hip. painfree, instrument assisted STM   PT Home Exercise Plan standing hip extension/abduction, hamstring stretch   Consulted and Agree with Plan of Care Patient        Problem List Patient Active Problem List   Diagnosis Date Noted  . Quadriceps tendinitis 11/16/2014  . Neck pain 07/07/2014  . Concussion with no loss of consciousness 07/07/2014  . Nonallopathic lesion of lumbosacral region 11/04/2013  . Nonallopathic lesion of thoracic region 11/04/2013  . Piriformis syndrome of left side 10/14/2013  . Greater trochanteric bursitis of left hip 10/14/2013  . Trapezius muscle spasm 11/26/2012  . Nonallopathic lesion of cervicothoracic region 11/26/2012  . Seasonal allergies 09/28/2012  . Asthma attack 12/01/2011  . Backache 09/26/2009  . OTHER ACQUIRED DEFORMITY OF ANKLE AND FOOT OTHER 09/26/2009  . ABNORMALITY OF GAIT 09/26/2009  . KNEE PAIN, RIGHT 08/08/2009  . TENDINITIS, PATELLAR 08/08/2009   Lulu Riding PT, DPT, LAT, ATC  01/29/2015  5:44 PM      [facility] [address] [place], [place], [address] Phone: [phone]   Fax:  [phone]

## 2015-01-29 NOTE — Patient Instructions (Signed)
   Kristoffer Leamon PT, DPT, LAT, ATC  [facility] Phone: [phone]     

## 2015-01-31 ENCOUNTER — Ambulatory Visit: Payer: 59 | Admitting: Physical Therapy

## 2015-01-31 DIAGNOSIS — M25561 Pain in right knee: Secondary | ICD-10-CM | POA: Diagnosis not present

## 2015-01-31 DIAGNOSIS — R29898 Other symptoms and signs involving the musculoskeletal system: Secondary | ICD-10-CM

## 2015-01-31 NOTE — Therapy (Signed)
[facility] [address] [place], [place], [address] Phone: [phone]   Fax:  [phone]  Physical Therapy Treatment  Patient Details  Name: Melanie [name] MRN: [id number] Date of Birth: Apr 22, 1961 Referring Provider:  Dorothyann Peng, MD  Encounter Date: 01/31/2015      PT End of Session - 01/31/15 1753    Visit Number 5   Number of Visits 12   Date for PT Re-Evaluation 02/08/15   PT Start Time 1630   PT Stop Time 1725   PT Time Calculation (min) 55 min   Activity Tolerance Patient tolerated treatment well   Behavior During Therapy [facility] for tasks assessed/performed      Past Medical History  Diagnosis Date  . Allergy   . Asthma   . Osteoporosis     Past Surgical History  Procedure Laterality Date  . Appendectomy    . Abdominal hysterectomy    . Knee surgery      There were no vitals filed for this visit.  Visit Diagnosis:  Right knee pain  Weakness of right lower extremity  Weakness of both hips      Subjective Assessment - 01/31/15 1752    Subjective I have some pain in the tendons today but it isn't bad, possibly due to exercising at home.    Currently in Pain? Yes   Pain Score 2    Pain Location Knee   Pain Orientation Right   Pain Descriptors / Indicators Aching   Pain Type Chronic pain   Pain Onset More than a month ago   Pain Frequency Intermittent   Aggravating Factors  rainy weaither   Pain Relieving Factors ice                         [facility] Adult PT Treatment/Exercise - 01/31/15 0001    Knee/Hip Exercises: Stretches   Lobbyist 30 seconds;2 reps   Gastroc Stretch 3 reps;30 seconds   Gastroc Stretch Limitations edge of step   Knee/Hip Exercises: Aerobic   Stationary Bike L2 x 10 min   Knee/Hip Exercises: Supine   Quad Sets 10 reps   Straight Leg Raise with External Rotation AROM;Strengthening;Right;2 sets;10 reps   Knee/Hip Exercises: Sidelying   Hip ABduction  AROM;3 sets;10 reps   Clams x 10 bilateral   Modalities   Modalities Iontophoresis   Ultrasound   Ultrasound Location bil knees at the patellar tendon, and the R quad tendon   Ultrasound Parameters 100% 5 min R knee @ .8 w/cm @ patellar tendon, 5 minutes 100% ,1.1 w/cm @ lateral quad tendon , 100%. 3 minutes L patellar tendon .8 w/cm   Iontophoresis   Type of Iontophoresis Dexamethasone   Location R patellar tendon   Dose 1cc dexamethasone    Time 6 hour stat patch   Manual Therapy   Soft tissue mobilization --   Myofascial Release instrument assisted STM over bil patellar tendons and R quad tendon                PT Education - 01/31/15 1752    Education provided Yes   Education Details education regarding iontophoresis   Person(s) Educated Patient   Methods Explanation   Comprehension Verbalized understanding          PT Short Term Goals - 01/16/15 1636    PT SHORT TERM GOAL #1   Title pt will be I with basic HEP (01/18/2015)   Time 3  Period Weeks   Status Achieved   PT SHORT TERM GOAL #2   Title She will be able to verbalize and demonstrate techniques to reduce R knee inflammation via RICE method (01/18/2015)   Time 3   Period Weeks   Status Achieved           PT Long Term Goals - 01/29/15 1737    PT LONG TERM GOAL #1   Title Upon discharge pt will be I with all HEP given throughout therapy (02/08/2015)   Time 6   Period Weeks   Status On-going   PT LONG TERM GOAL #2   Title She will demonstrate <2/10 pain during /following squatting activities to assist with ADLs (02/08/2015)   Time 6   Period Weeks   Status On-going   PT LONG TERM GOAL #3   Title pt will demonstate >4+/5 R quad/hamstring strength for endurance with walking/standing activities (02/08/2015)   Time 6   Period Weeks   Status On-going   PT LONG TERM GOAL #4   Title pt will be able to perform "zumba" dance with <2/10 pain to assist with personal goal of being able to attend zumba and  other dance classes (02/08/2015)   Period Weeks   Status On-going   PT LONG TERM GOAL #5   Title She will increase her FOTO score to > 63 to demonstrate improved functional capacity upon discharge (02/08/2015)   Baseline inital 32   Time 6   Period Weeks   Status On-going               Plan - 01/31/15 1753    Clinical Impression Statement Thomasenia reports some incresaed soreness in her knees today with the R>L. She reports "crunchy feeling" in the  R anteriolatateral knee. performed US on bil knees and DTM using roc tools and she reported that she couldn't feel the crunchy sensation after. Educated about iontophoresis and  how to properly dispose of the pad pt agreed to treatment.    PT Next Visit Plan Assess response to Iontophoresis [place] over quad tendon,Quad sets, gluteal med.  Stretches calf, quads hip. painfree, instrument assisted STM, Iontophoresis?   Consulted and Agree with Plan of Care Patient        Problem List Patient Active Problem List   Diagnosis Date Noted  . Quadriceps tendinitis 11/16/2014  . Neck pain 07/07/2014  . Concussion with no loss of consciousness 07/07/2014  . Nonallopathic lesion of lumbosacral region 11/04/2013  . Nonallopathic lesion of thoracic region 11/04/2013  . Piriformis syndrome of left side 10/14/2013  . Greater trochanteric bursitis of left hip 10/14/2013  . Trapezius muscle spasm 11/26/2012  . Nonallopathic lesion of cervicothoracic region 11/26/2012  . Seasonal allergies 09/28/2012  . Asthma attack 12/01/2011  . Backache 09/26/2009  . OTHER ACQUIRED DEFORMITY OF ANKLE AND FOOT OTHER 09/26/2009  . ABNORMALITY OF GAIT 09/26/2009  . KNEE PAIN, RIGHT 08/08/2009  . TENDINITIS, PATELLAR 08/08/2009   Lulu Riding PT, DPT, LAT, ATC  01/31/2015  6:03 PM      [facility] [place] [address] [place], [place], [address] Phone: [phone]   Fax:  [phone]

## 2015-02-01 ENCOUNTER — Encounter: Payer: Self-pay | Admitting: Family Medicine

## 2015-02-01 ENCOUNTER — Ambulatory Visit (INDEPENDENT_AMBULATORY_CARE_PROVIDER_SITE_OTHER): Payer: 59 | Admitting: Family Medicine

## 2015-02-01 VITALS — BP 124/82 | HR 80 | Ht 61.5 in | Wt 147.0 lb

## 2015-02-01 DIAGNOSIS — M9902 Segmental and somatic dysfunction of thoracic region: Secondary | ICD-10-CM

## 2015-02-01 DIAGNOSIS — M999 Biomechanical lesion, unspecified: Secondary | ICD-10-CM

## 2015-02-01 DIAGNOSIS — M9903 Segmental and somatic dysfunction of lumbar region: Secondary | ICD-10-CM

## 2015-02-01 DIAGNOSIS — M542 Cervicalgia: Secondary | ICD-10-CM

## 2015-02-01 DIAGNOSIS — M76899 Other specified enthesopathies of unspecified lower limb, excluding foot: Secondary | ICD-10-CM

## 2015-02-01 DIAGNOSIS — M9901 Segmental and somatic dysfunction of cervical region: Secondary | ICD-10-CM | POA: Diagnosis not present

## 2015-02-01 DIAGNOSIS — M769 Unspecified enthesopathy, lower limb, excluding foot: Secondary | ICD-10-CM | POA: Diagnosis not present

## 2015-02-01 NOTE — Assessment & Plan Note (Signed)
Patient quadriceps tendon that seems to be improving somewhat. We discussed icing regimen and home exercises. Patient has made significant improvement. Patient will switch to daily vitamin D at this time. We discussed decompression as well as can be beneficial. Patient will finish up with physical therapy over the course the next several weeks. We will see her again in 4 weeks to make sure she is near completely healed.

## 2015-02-01 NOTE — Progress Notes (Signed)
[name] [address] [place], [place] [address] Phone: [phone] Subjective:     CC: Right knee pain follow-up  [id number] Melanie [name] is a 54 y.o. female coming in with complaint of right knee pain. Patient was seen previously and had more of a quadriceps tendinitis as well as a patellar contusion.  Patient is doing much better overall she states. Continues to go to formal physical therapy. States that she is approximate 70-80% better. Still has some tightness if she tries to do any explosive activity. Nothing on that is stopping her from her daily activities now.  Continues to have a dull aching sensation in the neck and back. Does thinks he improves as long as she works out on a more regular basis. Patient continues the vitamins and has noticed improvement. Patient also was finishing another course of a 12 week trial of vitamin D weekly. Has noticed that this is helped her with her mood, and muscle strength.     Past medical history, social, surgical and family history all reviewed in electronic medical record.   Review of Systems: No headache, visual changes, nausea, vomiting, diarrhea, constipation, dizziness, abdominal pain, skin rash, fevers, chills, night sweats, weight loss, swollen lymph nodes, body aches, joint swelling, muscle aches, chest pain, shortness of breath, mood changes.   Objective Blood pressure 124/82, pulse 80, height 5' 1.5" (1.562 m), weight 147 lb (66.679 kg), SpO2 91 %.  General: No apparent distress alert and oriented x3 mood and affect normal, dressed appropriately.  HEENT: Pupils equal, extraocular movements intact  Respiratory: Patient's speak in full sentences and does not appear short of breath  Cardiovascular: No lower extremity edema, non tender, no erythema  Skin: Warm dry intact with no signs of infection or rash on extremities or on axial skeleton.  Abdomen: Soft nontender  Neuro: Cranial nerves II  through XII are intact, neurovascularly intact in all extremities with 2+ DTRs and 2+ pulses.  Lymph: No lymphadenopathy of posterior or anterior cervical chain or axillae bilaterally.  Gait normal with good balance and coordination.  MSK:  Non tender with full range of motion and good stability and symmetric strength and tone of shoulders, elbows, wrist, hip, and ankles bilaterally.  Knee: Right Normal to inspection with no erythema or effusion or obvious bony abnormalities. Incision well-healed Minimal tenderness over the quadriceps tendon ROM full in flexion and extension and lower leg rotation. Ligaments with solid consistent endpoints including ACL, PCL, LCL, MCL. Negative Mcmurray's, Apley's, and Thessalonian tests. Painful to palpation over the patella. Patellar glide with minimal crepitus. Patellar and quadriceps tendons unremarkable. Hamstring and quadriceps strength is normal. Pain with resisted extension Contralateral knee unremarkable    Neck: Inspection unremarkable. No palpable stepoffs. Negative Spurling's maneuver. Full neck range of motion Grip strength and sensation normal in bilateral hands Strength good C4 to T1 distribution No sensory change to C4 to T1 Negative Hoffman sign bilaterally Reflexes normal Back Exam:  Inspection: Unremarkable  Motion: Flexion 45 deg, Extension 45 deg, Side Bending to 45 deg bilaterally,  Rotation to 45 deg bilaterally  SLR laying: Negative  XSLR laying: Negative  Palpable tenderness: Mild tenderness of the paraspinal musculature of the cervical and thoracic. FABER: negative  Sensory change: Gross sensation intact to all lumbar and sacral dermatomes.  Reflexes: 2+ at both patellar tendons, 2+ at achilles tendons, Babinski's downgoing.  Strength at foot  Plantar-flexion: 5/5 Dorsi-flexion: 5/5 Eversion: 5/5 Inversion: 5/5  Leg strength  Quad:  5/5 Hamstring: 5/5 Hip flexor: 5/5 Hip abductors: 4+/5  Gait  unremarkable.  MSK US performed of: Right knee This study was ordered, performed, and interpreted by Terrilee Files D.O.  Knee: All structures visualized. Anteromedial, anterolateral, posteromedial, and posterolateral menisci unremarkable without tearing, fraying, effusion, or displacement. Patellar Tendon unremarkable on long and transverse views without effusion.  Quadriceps tendonminimal hypoechoic changes and only mild calcific changes on the medial aspect of the tendon. No abnormality of prepatellar bursa. LCL and MCL unremarkable on long and transverse views. No abnormality of origin of medial or lateral head of the gastrocnemius.  IMPRESSION:  Improved quadriceps tendinitiscontinues  Osteopathic findings Cervical C2 flexed rotated and side bent left  Thoracic T3 extended rotated and side bent left inhale third rib T8 extended rotated and side bent right  Lumbar L2 flexed rotated and side bent right  Sacrum Left on left    Impression and Recommendations:     This case required medical decision making of moderate complexity.

## 2015-02-01 NOTE — Assessment & Plan Note (Signed)
Continue to respond well to osteopathic manipulation. We discussed icing regimen and home exercises. Patient will do more the postural exercises. Discussed still to scapular exercises that could be beneficial. Discussed that patient does do swimming make sure she is doing the correct mechanics. Patient come back in 4 weeks for further evaluation.

## 2015-02-01 NOTE — Progress Notes (Signed)
Pre visit review using our clinic review tool, if applicable. No additional management support is needed unless otherwise documented below in the visit note. 

## 2015-02-01 NOTE — Assessment & Plan Note (Signed)
Decision today to treat with OMT was based on Physical Exam  After verbal consent patient was treated with HVLA, ME techniques in cervical, thoracic, lumbar and sacral areas  Patient tolerated the procedure well with improvement in symptoms  Patient given exercises, stretches and lifestyle modifications  See medications in patient instructions if given  Patient will follow up in 4 weeks   

## 2015-02-01 NOTE — Patient Instructions (Addendum)
Good to see you Continue what you are doing.  The knee has about 20% to go.  Go to PT as much as you feel you need it.  Stay active In 2 weeks start vitamin D again daily at 2000 IU  See me again in 4 weeks.

## 2015-02-05 ENCOUNTER — Ambulatory Visit: Payer: 59 | Admitting: Physical Therapy

## 2015-02-05 DIAGNOSIS — M25561 Pain in right knee: Secondary | ICD-10-CM

## 2015-02-05 DIAGNOSIS — R29898 Other symptoms and signs involving the musculoskeletal system: Secondary | ICD-10-CM

## 2015-02-05 NOTE — Therapy (Signed)
[facility] Outpatient Rehabilitation [facility] [address] [place], [place], [address] Phone: [phone]   Fax:  [phone]  Physical Therapy Treatment  Patient Details  Name: Melanie [name] MRN: [id number] Date of Birth: 05/27/1961 Referring Provider:  Dorothyann Peng, MD  Encounter Date: 02/05/2015      PT End of Session - 02/05/15 1734    Visit Number 6   Number of Visits 12   Date for PT Re-Evaluation 02/08/15   PT Start Time 0430   PT Stop Time 0525   PT Time Calculation (min) 55 min   Activity Tolerance Patient tolerated treatment well   Behavior During Therapy [facility] for tasks assessed/performed      Past Medical History  Diagnosis Date  . Allergy   . Asthma   . Osteoporosis     Past Surgical History  Procedure Laterality Date  . Appendectomy    . Abdominal hysterectomy    . Knee surgery      There were no vitals filed for this visit.  Visit Diagnosis:  Right knee pain  Weakness of right lower extremity  Weakness of both hips      Subjective Assessment - 02/05/15 1635    Subjective "I am doing pretty good today and haven't noticed having too much pain lately"   Currently in Pain? Yes   Pain Score 3    Pain Location Knee   Pain Orientation Right   Pain Descriptors / Indicators Aching   Pain Type Chronic pain   Pain Onset More than a month ago   Pain Frequency Intermittent   Aggravating Factors  rainy weather   Pain Relieving Factors ice                         [facility] Adult PT Treatment/Exercise - 02/05/15 1637    Knee/Hip Exercises: Stretches   Quad Stretch 4 reps;30 seconds   Gastroc Stretch 3 reps;30 seconds   Gastroc Stretch Limitations edge of step   Knee/Hip Exercises: Aerobic   Stationary Bike L3 x 10 min   Knee/Hip Exercises: Standing   Forward Step Up 2 sets;Step Height: 4";10 reps   Step Down Both;10 reps;2 sets;Step Height: 4"   Other Standing Knee Exercises standing hip abduction/extension 2 x  15   Knee/Hip Exercises: Supine   Straight Leg Raise with External Rotation AROM;Strengthening;Right;2 sets;10 reps   Other Supine Knee/Hip Exercises reformer leg press with foot work 2 red 1 blue spring 2 x 10 each. Single leg press with other leg in table top position x 12 ea with 2 red, bridge with both feet on bar with 1 green, 2 red, and 1 blue  2 x 5.   Knee/Hip Exercises: Sidelying   Hip ABduction AROM;3 sets;10 reps   Ultrasound   Ultrasound Location R quad tendon    Ultrasound Parameters 100%, 1.0 x 8 min    Ultrasound Goals Other (Comment)  break up calcification   Iontophoresis   Type of Iontophoresis Dexamethasone   Location R patellar tendon   Dose 1cc dexamethasone    Time 6 hour stat patch   Manual Therapy   Myofascial Release instrument assisted STM over bil patellar tendons and R quad tendon                  PT Short Term Goals - 01/16/15 1636    PT SHORT TERM GOAL #1   Title pt will be I with basic HEP (01/18/2015)  Time 3   Period Weeks   Status Achieved   PT SHORT TERM GOAL #2   Title She will be able to verbalize and demonstrate techniques to reduce R knee inflammation via RICE method (01/18/2015)   Time 3   Period Weeks   Status Achieved           PT Long Term Goals - 02/05/15 1741    PT LONG TERM GOAL #1   Title Upon discharge pt will be I with all HEP given throughout therapy (02/08/2015)   Time 6   Period Weeks   Status On-going   PT LONG TERM GOAL #2   Title She will demonstrate <2/10 pain during /following squatting activities to assist with ADLs (02/08/2015)   Time 6   Period Weeks   Status On-going   PT LONG TERM GOAL #3   Title pt will demonstate >4+/5 R quad/hamstring strength for endurance with walking/standing activities (02/08/2015)   Time 6   Period Weeks   Status On-going   PT LONG TERM GOAL #4   Title pt will be able to perform "zumba" dance with <2/10 pain to assist with personal goal of being able to attend zumba and  other dance classes (02/08/2015)   Time 6   Period Weeks   Status On-going   PT LONG TERM GOAL #5   Title She will increase her FOTO score to > 63 to demonstrate improved functional capacity upon discharge (02/08/2015)   Baseline inital 32   Time 6   Period Weeks   Status On-going               Plan - 02/05/15 1734    Clinical Impression Statement [name] stated that the [place] and the iontophoresis really helped. She saw her referring physician and stated that saw signifcant improvement. She was able to perofrm all exercises well except for some soreness noted during eccentric step downs. She likeds the reformer  work and would like to continue.    PT Next Visit Plan  Iontophoresis, [place] over quad tendon,Quad sets, gluteal med.  Stretches calf, quads hip. painfree, instrument assisted STM, pilates        Problem List Patient Active Problem List   Diagnosis Date Noted  . Quadriceps tendinitis 11/16/2014  . Neck pain 07/07/2014  . Concussion with no loss of consciousness 07/07/2014  . Nonallopathic lesion of lumbosacral region 11/04/2013  . Nonallopathic lesion of thoracic region 11/04/2013  . Piriformis syndrome of left side 10/14/2013  . Greater trochanteric bursitis of left hip 10/14/2013  . Trapezius muscle spasm 11/26/2012  . Nonallopathic lesion of cervicothoracic region 11/26/2012  . Seasonal allergies 09/28/2012  . Asthma attack 12/01/2011  . Backache 09/26/2009  . OTHER ACQUIRED DEFORMITY OF ANKLE AND FOOT OTHER 09/26/2009  . ABNORMALITY OF GAIT 09/26/2009  . KNEE PAIN, RIGHT 08/08/2009  . TENDINITIS, PATELLAR 08/08/2009    Lulu Riding PT, DPT, LAT, ATC  02/05/2015  5:43 PM       [facility] [address] [place], [place], [address] Phone: [phone]   Fax:  [phone]

## 2015-02-07 ENCOUNTER — Ambulatory Visit: Payer: 59 | Admitting: Physical Therapy

## 2015-02-07 DIAGNOSIS — M25561 Pain in right knee: Secondary | ICD-10-CM

## 2015-02-07 DIAGNOSIS — R29898 Other symptoms and signs involving the musculoskeletal system: Secondary | ICD-10-CM

## 2015-02-07 NOTE — Therapy (Addendum)
[facility] Outpatient Rehabilitation [facility] [address] [place], [place], [address] Phone: [phone]   Fax:  [phone]  Physical Therapy Treatment  Patient Details  Name: Melanie [name] MRN: [id number] Date of Birth: 1960/08/10 Referring Provider:  Dorothyann Peng, MD  Encounter Date: 02/07/2015   Vist Number [id number] Number of Visits: 15 Date for PT Re-evaluation 03/08/2015 Pt Start Time: 1630 PT Stop Time: 1720 PT Time Calculation (min) 50 min Activity Tolerance: Patient tolerated treatment well Behavior During Therapy: [facility] for tasks Assessed/performed.    Past Medical History  Diagnosis Date  . Allergy   . Asthma   . Osteoporosis     Past Surgical History  Procedure Laterality Date  . Appendectomy    . Abdominal hysterectomy    . Knee surgery      There were no vitals filed for this visit.  Visit Diagnosis:  Right knee pain  Weakness of right lower extremity  Weakness of both hips      Subjective Assessment - 02/07/15 1638    Subjective " I had some crunching in the knee earlier today but noticed its not really that bad right now"   Currently in Pain? No/denies   Pain Score 0-No pain   Pain Location Knee   Pain Orientation Right   Pain Descriptors / Indicators Aching   Pain Type Chronic pain             [facility] PT Assessment - 02/13/15 0001    Assessment   Medical Diagnosis R quadricep tendinitis   Hand Dominance Left   Prior Therapy yes   Balance Screen   Has the patient fallen in the past 6 months Yes   How many times? 1   Has the patient had a decrease in activity level because of a fear of falling?  No   Is the patient reluctant to leave their home because of a fear of falling?  No   Home Environment   Living Environment Private residence   Living Arrangements Alone   Type of Home House   Home Access Stairs to enter   Entrance Stairs-Number of Steps 2   Entrance Stairs-Rails Right   Home Layout One level   Prior  Function   Level of Independence Independent;Independent with basic ADLs   Vocation Full time employment   Vocation Requirements prolonged sitting, standing   Leisure aquatic therapy,dancing   Cognition   Overall Cognitive Status Within Functional Limits for tasks assessed   AROM   Right Knee Extension 0   Right Knee Flexion 138   Left Knee Extension 0   Left Knee Flexion 135   Strength   Right Hip Flexion 5/5   Right Hip Extension 4/5   Right Hip ABduction 4/5   Right Hip ADduction 4+/5   Left Hip Flexion 4+/5   Left Hip Extension 4/5   Left Hip ABduction 4/5   Left Hip ADduction 5/5   Right Knee Flexion 4/5   Right Knee Extension 4/5   Left Knee Flexion 5/5   Left Knee Extension 5/5                       [facility] Adult PT Treatment/Exercise - 02/07/15 0001    Knee/Hip Exercises: Stretches   Quad Stretch 4 reps;30 seconds   Gastroc Stretch 3 reps;30 seconds   Gastroc Stretch Limitations edge of step   Knee/Hip Exercises: Aerobic   Nustep L8 x 6 min   Knee/Hip Exercises: Standing  Heel Raises Both;2 sets;15 reps  off of edge of step   Forward Step Up 2 sets;Step Height: 4";10 reps   Step Down Both;10 reps;2 sets;Step Height: 4"   Wall Squat 1 set;15 reps  VC to stop when can see toes past the knees   Other Standing Knee Exercises hip hikes with right leg on step x 15   Other Standing Knee Exercises standing hip abduction/extension 2 x 15  4#   Knee/Hip Exercises: Seated   Long Arc Quad AROM;Strengthening;Left;2 sets;10 reps   Long Arc Quad Weight 3 lbs.   Knee/Hip Exercises: Supine   Bridges with Clamshell AROM;Strengthening;Both;2 sets;10 reps  with green band   Straight Leg Raise with External Rotation AROM;Strengthening;Right;2 sets;10 reps  3#   Knee/Hip Exercises: Sidelying   Hip ABduction AROM;3 sets;10 reps  4#   Ultrasound   Ultrasound Location R lateral quad tendon   Ultrasound Parameters 100%, Level 1.2 x 8 min   Ultrasound Goals Other  (Comment)  break up calcification   Iontophoresis   Type of Iontophoresis Dexamethasone   Location R patellar tendon   Dose 1cc dexamethasone    Time 6 hour stat patch                PT Education - 02/07/15 1726    Education provided Yes   Education Details updated HEP   Person(s) Educated Patient   Methods Explanation   Comprehension Verbalized understanding          PT Short Term Goals - 01/16/15 1636    PT SHORT TERM GOAL #1   Title pt will be I with basic HEP (01/18/2015)   Time 3   Period Weeks   Status Achieved   PT SHORT TERM GOAL #2   Title She will be able to verbalize and demonstrate techniques to reduce R knee inflammation via RICE method (01/18/2015)   Time 3   Period Weeks   Status Achieved           PT Long Term Goals - 02/05/15 1741    PT LONG TERM GOAL #1   Title Upon discharge pt will be I with all HEP given throughout therapy (02/08/2015)   Time 6   Period Weeks   Status On-going   PT LONG TERM GOAL #2   Title She will demonstrate <2/10 pain during /following squatting activities to assist with ADLs (02/08/2015)   Time 6   Period Weeks   Status On-going   PT LONG TERM GOAL #3   Title pt will demonstate >4+/5 R quad/hamstring strength for endurance with walking/standing activities (02/08/2015)   Time 6   Period Weeks   Status On-going   PT LONG TERM GOAL #4   Title pt will be able to perform "zumba" dance with <2/10 pain to assist with personal goal of being able to attend zumba and other dance classes (02/08/2015)   Time 6   Period Weeks   Status On-going   PT LONG TERM GOAL #5   Title She will increase her FOTO score to > 63 to demonstrate improved functional capacity upon discharge (02/08/2015)   Baseline inital 32   Time 6   Period Weeks   Status On-going         Plan - 02/07/15 1740  Clinical Impression Statement Melanie [name] continues to make progress with decreased pain and intermittent "popping". She performed all exercises  well with report occasionall pain/ irritation during step up activities that was fleeting once she was  on the step. She is improving with decreased pain and increased strength in bil LE. Plan to continue with current POC for the next 3 weeks.  Pt will benefit from skilled therapeutic intervention in order to improve  on the following deficits Decreased activity tolerance;Decreased  endurance;Decreased strength;Hypermobility;Pain   Rehab Potential Good  PT Frequency 2x / week  PT Duration 4 weeks   PT Treatment/Interventions ADLs/Self Care Home  Management;Cryotherapy;Electrical Stimulation;Iontophoresis /ml  Dexamethasone;Moist Heat;Ultrasound;Therapeutic activities;Therapeutic  exercise;Neuromuscular re-education;Balance training;Patient/family  education;Manual techniques;Dry needling;Passive range of motion;Taping   PT Next Visit Plan Iontophoresis, [place] over quad tendon,Quad sets, gluteal med.  Stretches calf, quads hip. painfree, instrument assisted STM, pilates  PT Home Exercise Plan Hip hike, single leg stance   Consulted and Agree with Plan of Care Patient      Problem List Patient Active Problem List   Diagnosis Date Noted  . Quadriceps tendinitis 11/16/2014  . Neck pain 07/07/2014  . Concussion with no loss of consciousness 07/07/2014  . Nonallopathic lesion of lumbosacral region 11/04/2013  . Nonallopathic lesion of thoracic region 11/04/2013  . Piriformis syndrome of left side 10/14/2013  . Greater trochanteric bursitis of left hip 10/14/2013  . Trapezius muscle spasm 11/26/2012  . Nonallopathic lesion of cervicothoracic region 11/26/2012  . Seasonal allergies 09/28/2012  . Asthma attack 12/01/2011  . Backache 09/26/2009  . OTHER ACQUIRED DEFORMITY OF ANKLE AND FOOT OTHER 09/26/2009  . ABNORMALITY OF GAIT 09/26/2009  . KNEE PAIN, RIGHT 08/08/2009  . TENDINITIS, PATELLAR 08/08/2009   Lulu Riding PT, DPT, LAT, ATC  02/07/2015  5:31  PM      [facility] [address] [place], [place], [address] Phone: [phone]   Fax:  [phone]

## 2015-02-13 ENCOUNTER — Ambulatory Visit: Payer: 59 | Attending: Family Medicine | Admitting: Physical Therapy

## 2015-02-13 DIAGNOSIS — M6289 Other specified disorders of muscle: Secondary | ICD-10-CM | POA: Insufficient documentation

## 2015-02-13 DIAGNOSIS — M25561 Pain in right knee: Secondary | ICD-10-CM | POA: Insufficient documentation

## 2015-02-13 DIAGNOSIS — R29898 Other symptoms and signs involving the musculoskeletal system: Secondary | ICD-10-CM | POA: Insufficient documentation

## 2015-02-13 NOTE — Addendum Note (Signed)
Addended by: Milford Cage on: 02/13/2015 08:29 AM   Modules accepted: Orders

## 2015-02-14 NOTE — Therapy (Signed)
[facility] [address] [place], [place], [address] Phone: [phone]   Fax:  [phone]  Physical Therapy Treatment  Patient Details  Name: Melanie [name] MRN: [id number] Date of Birth: Aug 28, 1960 Referring Provider:  Dorothyann Peng, MD  Encounter Date: 02/13/2015      PT End of Session - 02/13/15 1223    Visit Number 8   Number of Visits 15   Date for PT Re-Evaluation 03/08/15   PT Start Time 0430   PT Stop Time 0530   PT Time Calculation (min) 60 min      Past Medical History  Diagnosis Date  . Allergy   . Asthma   . Osteoporosis     Past Surgical History  Procedure Laterality Date  . Appendectomy    . Abdominal hysterectomy    . Knee surgery      There were no vitals filed for this visit.  Visit Diagnosis:  Right knee pain  Weakness of right lower extremity  Weakness of both hips      Subjective Assessment - 02/13/15 1635    Subjective Its aching a little today. I went down the stairs and I felt it afterward.    Currently in Pain? Yes   Pain Score 5    Pain Location Knee   Pain Orientation Right   Pain Descriptors / Indicators Aching   Aggravating Factors  squatting low, down stairs   Pain Relieving Factors ice                         [facility] Adult PT Treatment/Exercise - 02/13/15 1640    Knee/Hip Exercises: Aerobic   Stationary Bike L2 x 10 min   Knee/Hip Exercises: Machines for Strengthening   Cybex Knee Extension 1 plate x 10    Cybex Knee Flexion 3 plates x 15   Cybex Leg Press 2,3 plates x 20   Knee/Hip Exercises: Standing   Wall Squat 1 set;15 reps   Rebounder red ball trials 6-8 toss best    Other Standing Knee Exercises Bosu flat surface naroow and wide balance, weight shifting and mini squats without UE support   Other Standing Knee Exercises Resisted gait 2 plates forward and back 4 times each   Modalities   Modalities Cryotherapy   Cryotherapy   Number  Minutes Cryotherapy 15 Minutes   Cryotherapy Location Knee  Rt   Type of Cryotherapy Ice pack   Iontophoresis   Type of Iontophoresis Dexamethasone   Location R patella tendon   Dose 1cc   Time 6 hours                  PT Short Term Goals - 01/16/15 1636    PT SHORT TERM GOAL #1   Title pt will be I with basic HEP (01/18/2015)   Time 3   Period Weeks   Status Achieved   PT SHORT TERM GOAL #2   Title She will be able to verbalize and demonstrate techniques to reduce R knee inflammation via RICE method (01/18/2015)   Time 3   Period Weeks   Status Achieved           PT Long Term Goals - 02/05/15 1741    PT LONG TERM GOAL #1   Title Upon discharge pt will be I with all HEP given throughout therapy (02/08/2015)   Time 6   Period Weeks   Status On-going   PT LONG TERM  GOAL #2   Title She will demonstrate <2/10 pain during /following squatting activities to assist with ADLs (02/08/2015)   Time 6   Period Weeks   Status On-going   PT LONG TERM GOAL #3   Title pt will demonstate >4+/5 R quad/hamstring strength for endurance with walking/standing activities (02/08/2015)   Time 6   Period Weeks   Status On-going   PT LONG TERM GOAL #4   Title pt will be able to perform "zumba" dance with <2/10 pain to assist with personal goal of being able to attend zumba and other dance classes (02/08/2015)   Time 6   Period Weeks   Status On-going   PT LONG TERM GOAL #5   Title She will increase her FOTO score to > 63 to demonstrate improved functional capacity upon discharge (02/08/2015)   Baseline inital 32   Time 6   Period Weeks   Status On-going               Plan - 02/13/15 1732    Clinical Impression Statement strength/ balance and Single leg activities focus today with pt demonstrating mild difficulty. She reports continued difficulty going down the stairs. She would like to try ultrasound again as she feels this has beneficial in breaking up calcifications.    PT  Next Visit Plan  Iontophoresis, [place] over quad tendon,Quad sets, gluteal med.  Stretches calf, quads hip. painfree, instrument assisted STM, pilates- ULTRASOUND NEXT VISIT, eccentric quads/step downs        Problem List Patient Active Problem List   Diagnosis Date Noted  . Quadriceps tendinitis 11/16/2014  . Neck pain 07/07/2014  . Concussion with no loss of consciousness 07/07/2014  . Nonallopathic lesion of lumbosacral region 11/04/2013  . Nonallopathic lesion of thoracic region 11/04/2013  . Piriformis syndrome of left side 10/14/2013  . Greater trochanteric bursitis of left hip 10/14/2013  . Trapezius muscle spasm 11/26/2012  . Nonallopathic lesion of cervicothoracic region 11/26/2012  . Seasonal allergies 09/28/2012  . Asthma attack 12/01/2011  . Backache 09/26/2009  . OTHER ACQUIRED DEFORMITY OF ANKLE AND FOOT OTHER 09/26/2009  . ABNORMALITY OF GAIT 09/26/2009  . KNEE PAIN, RIGHT 08/08/2009  . TENDINITIS, PATELLAR 08/08/2009    Sherrie Mustache 02/14/2015, 12:24 PM  [facility] [address] [place], [place], [address] Phone: [phone]   Fax:  [phone]

## 2015-02-15 ENCOUNTER — Ambulatory Visit: Payer: 59 | Admitting: Physical Therapy

## 2015-02-15 DIAGNOSIS — R29898 Other symptoms and signs involving the musculoskeletal system: Secondary | ICD-10-CM

## 2015-02-15 DIAGNOSIS — M25561 Pain in right knee: Secondary | ICD-10-CM | POA: Diagnosis not present

## 2015-02-15 NOTE — Therapy (Signed)
[facility] Outpatient Rehabilitation [facility] [address] [place], [place], [address] Phone: [phone]   Fax:  [phone]  Physical Therapy Treatment  Patient Details  Name: Melanie [name] MRN: [id number] Date of Birth: 05/16/1961 Referring Provider:  Dorothyann Peng, MD  Encounter Date: 02/15/2015      PT End of Session - 02/15/15 1728    Visit Number 9   Number of Visits 15   Date for PT Re-Evaluation 03/08/15   PT Start Time 1630   PT Stop Time 1720   PT Time Calculation (min) 50 min   Activity Tolerance Patient tolerated treatment well   Behavior During Therapy [facility] for tasks assessed/performed      Past Medical History  Diagnosis Date  . Allergy   . Asthma   . Osteoporosis     Past Surgical History  Procedure Laterality Date  . Appendectomy    . Abdominal hysterectomy    . Knee surgery      There were no vitals filed for this visit.  Visit Diagnosis:  Right knee pain  Weakness of right lower extremity  Weakness of both hips      Subjective Assessment - 02/15/15 1639    Subjective "I still feel the popping in my knee but it has been getting better"   Pain Score 4    Pain Location Knee   Pain Orientation Right   Pain Descriptors / Indicators Aching   Pain Type Chronic pain   Pain Onset More than a month ago   Pain Frequency Intermittent   Aggravating Factors  squatting low, walking down steps   Pain Relieving Factors ice                         [facility] Adult PT Treatment/Exercise - 02/15/15 1640    Knee/Hip Exercises: Stretches   Active Hamstring Stretch 2 reps;30 seconds   Quad Stretch 4 reps;30 seconds   Gastroc Stretch 3 reps;30 seconds   Gastroc Stretch Limitations edge of step   Knee/Hip Exercises: Aerobic   Elliptical L1 x 5 min   Knee/Hip Exercises: Machines for Strengthening   Cybex Knee Extension 1 plate 2 x 10   1 set up with both and down with the L   Cybex Knee Flexion 3 plates x 15   Cybex  Leg Press 3, 4 plates x 15   Knee/Hip Exercises: Standing   Heel Raises Both;2 sets;20 reps   Forward Step Up 2 sets;Step Height: 4";10 reps   Wall Squat 1 set;15 reps  3 x  15 sec wall sit   Rebounder red ball tosses on airex pad 3 x 8  best was 10   Ultrasound   Ultrasound Location R lateral quad    Ultrasound Parameters 1 cm/w2, 100%, intensity 1. 3, 6 min   Ultrasound Goals Other (Comment)                PT Education - 02/15/15 1728    Education provided No          PT Short Term Goals - 01/16/15 1636    PT SHORT TERM GOAL #1   Title pt will be I with basic HEP (01/18/2015)   Time 3   Period Weeks   Status Achieved   PT SHORT TERM GOAL #2   Title She will be able to verbalize and demonstrate techniques to reduce R knee inflammation via RICE method (01/18/2015)   Time 3   Period  Weeks   Status Achieved           PT Long Term Goals - 02/15/15 1732    PT LONG TERM GOAL #1   Title Upon discharge pt will be I with all HEP given throughout therapy (02/08/2015)   Time 6   Period Weeks   Status On-going   PT LONG TERM GOAL #2   Title She will demonstrate <2/10 pain during /following squatting activities to assist with ADLs (02/08/2015)   Time 6   Period Weeks   Status On-going   PT LONG TERM GOAL #3   Title pt will demonstate >4+/5 R quad/hamstring strength for endurance with walking/standing activities (02/08/2015)   Time 6   Period Weeks   Status On-going   PT LONG TERM GOAL #4   Title pt will be able to perform "zumba" dance with <2/10 pain to assist with personal goal of being able to attend zumba and other dance classes (02/08/2015)   Time 6   Period Weeks   Status On-going   PT LONG TERM GOAL #5   Title She will increase her FOTO score to > 63 to demonstrate improved functional capacity upon discharge (02/08/2015)   Baseline inital 32   Time 6   Period Weeks   Status On-going               Plan - 02/15/15 1729    Clinical Impression Statement  Melanie [name] reported that she feels like she is doing better. she was able to complete all exercises except for lunge walking, and with step downs. Following todays sesssion felt like she had a good work out and had had muscle soreness.    PT Next Visit Plan  Iontophoresis, [place] over quad tendon,Quad sets, gluteal med.  Stretches calf, quads hip. painfree, instrument assisted STM, pilates- ULTRASOUND NEXT VISIT, eccentric quads/step downs        Problem List Patient Active Problem List   Diagnosis Date Noted  . Quadriceps tendinitis 11/16/2014  . Neck pain 07/07/2014  . Concussion with no loss of consciousness 07/07/2014  . Nonallopathic lesion of lumbosacral region 11/04/2013  . Nonallopathic lesion of thoracic region 11/04/2013  . Piriformis syndrome of left side 10/14/2013  . Greater trochanteric bursitis of left hip 10/14/2013  . Trapezius muscle spasm 11/26/2012  . Nonallopathic lesion of cervicothoracic region 11/26/2012  . Seasonal allergies 09/28/2012  . Asthma attack 12/01/2011  . Backache 09/26/2009  . OTHER ACQUIRED DEFORMITY OF ANKLE AND FOOT OTHER 09/26/2009  . ABNORMALITY OF GAIT 09/26/2009  . KNEE PAIN, RIGHT 08/08/2009  . TENDINITIS, PATELLAR 08/08/2009   Lulu Riding PT, DPT, LAT, ATC  02/15/2015  5:34 PM      [facility] [address] [place], [place], [address] Phone: [phone]   Fax:  [phone]

## 2015-02-20 ENCOUNTER — Ambulatory Visit: Payer: 59 | Admitting: Physical Therapy

## 2015-02-20 DIAGNOSIS — R29898 Other symptoms and signs involving the musculoskeletal system: Secondary | ICD-10-CM

## 2015-02-20 DIAGNOSIS — M25561 Pain in right knee: Secondary | ICD-10-CM | POA: Diagnosis not present

## 2015-02-20 NOTE — Therapy (Signed)
[facility] [address] [place], [place], [address] Phone: [phone]   Fax:  [phone]  Physical Therapy Treatment  Patient Details  Name: Melanie [name] MRN: [id number] Date of Birth: 08/16/1960 Referring Provider:  Dorothyann Peng, MD  Encounter Date: 02/20/2015      PT End of Session - 02/20/15 1722    Visit Number 10   Number of Visits 15   Date for PT Re-Evaluation 03/08/15   PT Start Time 0430   PT Stop Time 0523   PT Time Calculation (min) 53 min      Past Medical History  Diagnosis Date  . Allergy   . Asthma   . Osteoporosis     Past Surgical History  Procedure Laterality Date  . Appendectomy    . Abdominal hysterectomy    . Knee surgery      There were no vitals filed for this visit.  Visit Diagnosis:  Right knee pain  Weakness of right lower extremity  Weakness of both hips      Subjective Assessment - 02/20/15 1638    Subjective I did 2 flights of stairs 3 different times today    Currently in Pain? Yes   Pain Score 2    Pain Location Knee   Pain Orientation Right   Aggravating Factors  descending stairs fast   Pain Relieving Factors taking stairs slowly            [facility] PT Assessment - 02/20/15 1644    Observation/Other Assessments   Focus on Therapeutic Outcomes (FOTO)  45% limited improved from 49% limited (37% predicted)   Strength   Right Knee Flexion 4+/5   Right Knee Extension 4+/5                     [facility] Adult PT Treatment/Exercise - 02/20/15 0001    Knee/Hip Exercises: Aerobic   Elliptical L1 x 6 min   Knee/Hip Exercises: Standing   Forward Lunges Both;10 reps   Forward Lunges Limitations in doorway for alignment- cues to keep in pain free ROM   Step Down 2 sets;10 reps;Hand Hold: 1;Step Height: 4"   Step Down Limitations retro step up, single leg squat -keeping pain free ROM   Other Standing Knee Exercises Bosu flat surface naroow and wide  balance, weight shifting and mini squats without UE support   Other Standing Knee Exercises Resisted gait 3 plates   back 3   Ultrasound   Ultrasound Location R lateral quad and patella tendon   Ultrasound Parameters 100% 1.3 w/cm2 x 6 min   Ultrasound Goals Other (Comment)  break up calcification   Iontophoresis   Type of Iontophoresis Dexamethasone   Location R patella tendon   Dose 1cc   Time 6 hours                  PT Short Term Goals - 01/16/15 1636    PT SHORT TERM GOAL #1   Title pt will be I with basic HEP (01/18/2015)   Time 3   Period Weeks   Status Achieved   PT SHORT TERM GOAL #2   Title She will be able to verbalize and demonstrate techniques to reduce R knee inflammation via RICE method (01/18/2015)   Time 3   Period Weeks   Status Achieved           PT Long Term Goals - 02/20/15 1647    PT LONG TERM GOAL #1  Title Upon discharge pt will be I with all HEP given throughout therapy (02/08/2015)   Time 6   Period Weeks   Status On-going   PT LONG TERM GOAL #2   Title She will demonstrate <2/10 pain during /following squatting activities to assist with ADLs (02/08/2015)   Time 6   Period Weeks   Status Achieved   PT LONG TERM GOAL #3   Title pt will demonstate >4+/5 R quad/hamstring strength for endurance with walking/standing activities (02/08/2015)   Time 6   Period Weeks   Status Achieved   PT LONG TERM GOAL #4   Title pt will be able to perform "zumba" dance with <2/10 pain to assist with personal goal of being able to attend zumba and other dance classes (02/08/2015)   Time 6   Period Weeks   Status Achieved   PT LONG TERM GOAL #5   Title She will increase her FOTO score to > 63 to demonstrate improved functional capacity upon discharge (02/08/2015)   Time 6   Period Weeks   Status On-going               Plan - 02/20/15 1723    Clinical Impression Statement LTG # 2,# 3 achieved. Pt reports difficulty with lunges and down stairs so  focused on those tecniques for HEP.    PT Next Visit Plan  Iontophoresis, [place] over quad tendon,Quad sets, gluteal med.  Stretches calf, quads hip. painfree, instrument assisted STM, pilates- ULTRASOUND NEXT VISIT, eccentric quads/step downs        Problem List Patient Active Problem List   Diagnosis Date Noted  . Quadriceps tendinitis 11/16/2014  . Neck pain 07/07/2014  . Concussion with no loss of consciousness 07/07/2014  . Nonallopathic lesion of lumbosacral region 11/04/2013  . Nonallopathic lesion of thoracic region 11/04/2013  . Piriformis syndrome of left side 10/14/2013  . Greater trochanteric bursitis of left hip 10/14/2013  . Trapezius muscle spasm 11/26/2012  . Nonallopathic lesion of cervicothoracic region 11/26/2012  . Seasonal allergies 09/28/2012  . Asthma attack 12/01/2011  . Backache 09/26/2009  . OTHER ACQUIRED DEFORMITY OF ANKLE AND FOOT OTHER 09/26/2009  . ABNORMALITY OF GAIT 09/26/2009  . KNEE PAIN, RIGHT 08/08/2009  . TENDINITIS, PATELLAR 08/08/2009    Sherrie Mustache, PTA 02/20/2015, 5:24 PM  [facility] [address] [place], [place], [address] Phone: [phone]   Fax:  [phone]

## 2015-02-22 ENCOUNTER — Encounter: Payer: 59 | Admitting: Physical Therapy

## 2015-02-27 ENCOUNTER — Ambulatory Visit: Payer: 59 | Admitting: Physical Therapy

## 2015-02-27 DIAGNOSIS — R29898 Other symptoms and signs involving the musculoskeletal system: Secondary | ICD-10-CM

## 2015-02-27 DIAGNOSIS — M25561 Pain in right knee: Secondary | ICD-10-CM

## 2015-02-27 NOTE — Therapy (Signed)
[facility] [address] [place], [place], [address] Phone: [phone]   Fax:  [phone]  Physical Therapy Treatment  Patient Details  Name: Melanie [name] MRN: [id number] Date of Birth: 11/22/1960 Referring Provider:  Dorothyann Peng, MD  Encounter Date: 02/27/2015      PT End of Session - 02/27/15 1643    Visit Number 11   Number of Visits 15   Date for PT Re-Evaluation 03/08/15   PT Start Time 0430   PT Stop Time 0508   PT Time Calculation (min) 38 min      Past Medical History  Diagnosis Date  . Allergy   . Asthma   . Osteoporosis     Past Surgical History  Procedure Laterality Date  . Appendectomy    . Abdominal hysterectomy    . Knee surgery      There were no vitals filed for this visit.  Visit Diagnosis:  Right knee pain  Weakness of right lower extremity  Weakness of both hips      Subjective Assessment - 02/27/15 1639    Subjective its bothering me today   Currently in Pain? Yes   Pain Score 6   or 7   Pain Location Knee   Pain Orientation Right   Pain Descriptors / Indicators Sore   Pain Frequency Intermittent   Aggravating Factors  walking   Pain Relieving Factors not moving                         [facility] Adult PT Treatment/Exercise - 02/27/15 0001    Knee/Hip Exercises: Aerobic   Elliptical L1 x 6 min   Modalities   Modalities Cryotherapy   Cryotherapy   Number Minutes Cryotherapy 3 Minutes   Cryotherapy Location Knee   Type of Cryotherapy Ice massage   Ultrasound   Ultrasound Location patella tendon   Ultrasound Parameters 20% .5 w/cm2   Ultrasound Goals Pain   Manual Therapy   Manual Therapy Edema management   Edema Management retro grade massage for swelling around patella tendon                  PT Short Term Goals - 01/16/15 1636    PT SHORT TERM GOAL #1   Title pt will be I with basic HEP (01/18/2015)   Time 3   Period Weeks   Status  Achieved   PT SHORT TERM GOAL #2   Title She will be able to verbalize and demonstrate techniques to reduce R knee inflammation via RICE method (01/18/2015)   Time 3   Period Weeks   Status Achieved           PT Long Term Goals - 02/20/15 1647    PT LONG TERM GOAL #1   Title Upon discharge pt will be I with all HEP given throughout therapy (02/08/2015)   Time 6   Period Weeks   Status On-going   PT LONG TERM GOAL #2   Title She will demonstrate <2/10 pain during /following squatting activities to assist with ADLs (02/08/2015)   Time 6   Period Weeks   Status Achieved   PT LONG TERM GOAL #3   Title pt will demonstate >4+/5 R quad/hamstring strength for endurance with walking/standing activities (02/08/2015)   Time 6   Period Weeks   Status Achieved   PT LONG TERM GOAL #4   Title pt will be able to perform "zumba" dance  with <2/10 pain to assist with personal goal of being able to attend zumba and other dance classes (02/08/2015)   Time 6   Period Weeks   Status Achieved   PT LONG TERM GOAL #5   Title She will increase her FOTO score to > 63 to demonstrate improved functional capacity upon discharge (02/08/2015)   Time 6   Period Weeks   Status On-going               Plan - 02/27/15 1721    Clinical Impression Statement Pt presents with 6-7/10 knee pain today she attributes to the weather. Noted increased edema around patella tendon and tenderness. Used pulsed ultrasound, retrograde massage and ice massage to decrease pain. Pt reports 1/10 pain after treatment.    PT Next Visit Plan eview HEP and discharge next visit.         Problem List Patient Active Problem List   Diagnosis Date Noted  . Quadriceps tendinitis 11/16/2014  . Neck pain 07/07/2014  . Concussion with no loss of consciousness 07/07/2014  . Nonallopathic lesion of lumbosacral region 11/04/2013  . Nonallopathic lesion of thoracic region 11/04/2013  . Piriformis syndrome of left side 10/14/2013  .  Greater trochanteric bursitis of left hip 10/14/2013  . Trapezius muscle spasm 11/26/2012  . Nonallopathic lesion of cervicothoracic region 11/26/2012  . Seasonal allergies 09/28/2012  . Asthma attack 12/01/2011  . Backache 09/26/2009  . OTHER ACQUIRED DEFORMITY OF ANKLE AND FOOT OTHER 09/26/2009  . ABNORMALITY OF GAIT 09/26/2009  . KNEE PAIN, RIGHT 08/08/2009  . TENDINITIS, PATELLAR 08/08/2009    Sherrie Mustache, PTA 02/27/2015, 5:24 PM  [facility] [address] [place], [place], [address] Phone: [phone]   Fax:  [phone]

## 2015-02-28 ENCOUNTER — Encounter: Payer: Self-pay | Admitting: Family Medicine

## 2015-02-28 ENCOUNTER — Ambulatory Visit (INDEPENDENT_AMBULATORY_CARE_PROVIDER_SITE_OTHER): Payer: 59 | Admitting: Family Medicine

## 2015-02-28 VITALS — BP 124/80 | HR 80 | Ht 61.5 in | Wt 158.0 lb

## 2015-02-28 DIAGNOSIS — G43709 Chronic migraine without aura, not intractable, without status migrainosus: Secondary | ICD-10-CM

## 2015-02-28 DIAGNOSIS — M9901 Segmental and somatic dysfunction of cervical region: Secondary | ICD-10-CM

## 2015-02-28 DIAGNOSIS — M542 Cervicalgia: Secondary | ICD-10-CM

## 2015-02-28 DIAGNOSIS — M999 Biomechanical lesion, unspecified: Secondary | ICD-10-CM

## 2015-02-28 DIAGNOSIS — M9903 Segmental and somatic dysfunction of lumbar region: Secondary | ICD-10-CM | POA: Diagnosis not present

## 2015-02-28 DIAGNOSIS — M9902 Segmental and somatic dysfunction of thoracic region: Secondary | ICD-10-CM | POA: Diagnosis not present

## 2015-02-28 DIAGNOSIS — J45901 Unspecified asthma with (acute) exacerbation: Secondary | ICD-10-CM | POA: Insufficient documentation

## 2015-02-28 MED ORDER — KETOROLAC TROMETHAMINE 60 MG/2ML IM SOLN
60.0000 mg | Freq: Once | INTRAMUSCULAR | Status: AC
  Administered 2015-02-28: 60 mg via INTRAMUSCULAR

## 2015-02-28 MED ORDER — RIZATRIPTAN BENZOATE 10 MG PO TBDP: 10.0000 mg | ORAL_TABLET | ORAL | Status: DC | PRN

## 2015-02-28 NOTE — Assessment & Plan Note (Signed)
Doing much better at this time. Encourage her to stay active and do the home exercises. Discussed different postural changes of patient coming through the day. We discussed icing regimen. Discussed which activities to doing which was to potentially avoid. Patient will follow-up again in 6 weeks for further evaluation and treatment.

## 2015-02-28 NOTE — Progress Notes (Signed)
Pre visit review using our clinic review tool, if applicable. No additional management support is needed unless otherwise documented below in the visit note.,  

## 2015-02-28 NOTE — Patient Instructions (Addendum)
Good to see you Ice is your friend MAxalt i you need it.  Continue what you are doing.  I hope the headache gets better Avoid tramadol See me again in 5 weeks.

## 2015-02-28 NOTE — Progress Notes (Signed)
[name] [address] [place], [place] [address] Phone: [phone] Subjective:     CC: Right knee pain follow-up  [id number] Melanie [name] is a 54 y.o. female coming in with complaint of right knee pain. Had quadriceps tendinitis. Finish up with physical therapy. States that she notices significant decrease in the pain. States that she is still unable to jump repetitively but otherwise doing well with no pain.    Continues to have a dull aching sensation in the neck and back. Does thinks he improves as long as she works out on a more regular basis. Patient continues the vitamins and has noticed improvement. Patient finish course of high-dose vitamin D. Patient unfortunately is having a migraine and has a history of this. Out of her migraine medications.    Past medical history, social, surgical and family history all reviewed in electronic medical record.   Review of Systems: No headache, visual changes, nausea, vomiting, diarrhea, constipation, dizziness, abdominal pain, skin rash, fevers, chills, night sweats, weight loss, swollen lymph nodes, body aches, joint swelling, muscle aches, chest pain, shortness of breath, mood changes.   Objective Blood pressure 124/80, pulse 80, height 5' 1.5" (1.562 m), weight 158 lb (71.668 kg), SpO2 98 %.  General: No apparent distress alert and oriented x3 mood and affect normal, dressed appropriately.  HEENT: Pupils equal, extraocular movements intact  Respiratory: Patient's speak in full sentences and does not appear short of breath  Cardiovascular: No lower extremity edema, non tender, no erythema  Skin: Warm dry intact with no signs of infection or rash on extremities or on axial skeleton.  Abdomen: Soft nontender  Neuro: Cranial nerves II through XII are intact, neurovascularly intact in all extremities with 2+ DTRs and 2+ pulses.  Lymph: No lymphadenopathy of posterior or anterior cervical chain or axillae  bilaterally.  Gait normal with good balance and coordination.  MSK:  Non tender with full range of motion and good stability and symmetric strength and tone of shoulders, elbows, wrist, hip, and ankles bilaterally.  Knee: Right Normal to inspection with no erythema or effusion or obvious bony abnormalities. Incision well-healed No tenderness over the quadriceps tendon ROM full in flexion and extension and lower leg rotation. Ligaments with solid consistent endpoints including ACL, PCL, LCL, MCL. Negative Mcmurray's, Apley's, and Thessalonian tests. Able tenderness over the patella. Patellar glide significantly less crepitus Patellar and quadriceps tendons unremarkable. Hamstring and quadriceps strength is normal.  Contralateral knee unremarkable    Neck: Inspection unremarkable. No palpable stepoffs. Negative Spurling's maneuver. Full neck range of motion Grip strength and sensation normal in bilateral hands Strength good C4 to T1 distribution No sensory change to C4 to T1 Negative Hoffman sign bilaterally Reflexes normal Back Exam:  Inspection: Unremarkable  Motion: Flexion 45 deg, Extension 45 deg, Side Bending to 45 deg bilaterally,  Rotation to 45 deg bilaterally  SLR laying: Negative  XSLR laying: Negative  Palpable tenderness: Mild tenderness of the paraspinal musculature of the cervical and thoracic. FABER: negative  Sensory change: Gross sensation intact to all lumbar and sacral dermatomes.  Reflexes: 2+ at both patellar tendons, 2+ at achilles tendons, Babinski's downgoing.  Strength at foot  Plantar-flexion: 5/5 Dorsi-flexion: 5/5 Eversion: 5/5 Inversion: 5/5  Leg strength  Quad: 5/5 Hamstring: 5/5 Hip flexor: 5/5 Hip abductors: 4+/5  Gait unremarkable.  Osteopathic findings Cervical C2 flexed rotated and side bent left  Thoracic T3 extended rotated and side bent left inhale third rib T8 extended  rotated and side bent right  Lumbar L2 flexed  rotated and side bent right  Sacrum Left on left    Impression and Recommendations:     This case required medical decision making of moderate complexity.

## 2015-02-28 NOTE — Assessment & Plan Note (Signed)
Decision today to treat with OMT was based on Physical Exam  After verbal consent patient was treated with HVLA, ME techniques in cervical, thoracic, lumbar and sacral areas  Patient tolerated the procedure well with improvement in symptoms  Patient given exercises, stretches and lifestyle modifications  See medications in patient instructions if given  Patient will follow up in 6 weeks      

## 2015-03-01 ENCOUNTER — Ambulatory Visit: Payer: 59 | Admitting: Physical Therapy

## 2015-03-01 DIAGNOSIS — M25561 Pain in right knee: Secondary | ICD-10-CM

## 2015-03-01 DIAGNOSIS — R29898 Other symptoms and signs involving the musculoskeletal system: Secondary | ICD-10-CM

## 2015-03-01 NOTE — Therapy (Signed)
[facility] [place], [place], [address] Phone: [phone]   Fax:  [phone]  Physical Therapy Treatment  Patient Details  Name: Melanie [name] MRN: [id number] Date of Birth: 05-07-1961 Referring Provider:  Glendale Chard, MD  Encounter Date: 03/01/2015      PT End of Session - 03/01/15 1725    Visit Number 12   Number of Visits 15   Date for PT Re-Evaluation 03/08/15   PT Start Time 1630   PT Stop Time 4401   PT Time Calculation (min) 45 min   Activity Tolerance Patient tolerated treatment well   Behavior During Therapy [facility] for tasks assessed/performed      Past Medical History  Diagnosis Date  . Allergy   . Asthma   . Osteoporosis     Past Surgical History  Procedure Laterality Date  . Appendectomy    . Abdominal hysterectomy    . Knee surgery      There were no vitals filed for this visit.  Visit Diagnosis:  Right knee pain  Weakness of right lower extremity  Weakness of both hips      Subjective Assessment - 03/01/15 1644    Subjective "I am having a little nausea today because I had to get a toridol shot for her headaches"   Currently in Pain? No/denies   Pain Location Knee   Pain Orientation Right            [facility] PT Assessment - 03/01/15 0001    Balance Screen   Has the patient fallen in the past 6 months Yes   How many times? 1   Has the patient had a decrease in activity level because of a fear of falling?  No   Is the patient reluctant to leave their home because of a fear of falling?  No   Prior Function   Level of Independence Independent;Independent with basic ADLs   Vocation Full time employment   Vocation Requirements prolonged sitting, standing   Leisure aquatic therapy,dancing   Observation/Other Assessments   Focus on Therapeutic Outcomes (FOTO)  43% limited   AROM   Right Knee Extension 0   Right Knee Flexion 136   Left Knee Extension 0   Left Knee  Flexion 136   Strength   Right Hip Flexion 5/5   Right Hip Extension 4+/5   Right Hip ABduction 5/5   Right Hip ADduction 5/5   Left Hip Flexion 5/5   Left Hip Extension 4+/5   Left Hip ABduction 5/5   Left Hip ADduction 5/5   Right Knee Flexion 5/5   Right Knee Extension 5/5   Left Knee Flexion 5/5   Left Knee Extension 5/5                     [facility] Adult PT Treatment/Exercise - 03/01/15 0001    Knee/Hip Exercises: Aerobic   Nustep L8 x 10 min   Knee/Hip Exercises: Seated   Stool Scoot - Round Trips 2 x 90 ft                PT Education - 03/01/15 1723    Education provided Yes   Education Details updated HEP   Person(s) Educated Patient   Methods Explanation   Comprehension Verbalized understanding          PT Short Term Goals - 01/16/15 1636    PT SHORT TERM GOAL #1   Title pt will be  I with basic HEP (01/18/2015)   Time 3   Period Weeks   Status Achieved   PT SHORT TERM GOAL #2   Title She will be able to verbalize and demonstrate techniques to reduce R knee inflammation via RICE method (01/18/2015)   Time 3   Period Weeks   Status Achieved           PT Long Term Goals - 03/01/15 1728    PT LONG TERM GOAL #1   Title Upon discharge pt will be I with all HEP given throughout therapy (02/08/2015)   Time 6   Period Weeks   Status Achieved   PT LONG TERM GOAL #2   Title She will demonstrate <2/10 pain during /following squatting activities to assist with ADLs (02/08/2015)   Time 6   Period Weeks   Status Achieved   PT LONG TERM GOAL #3   Title pt will demonstate >4+/5 R quad/hamstring strength for endurance with walking/standing activities (02/08/2015)   Time 6   Period Weeks   Status Achieved   PT LONG TERM GOAL #4   Title pt will be able to perform "zumba" dance with <2/10 pain to assist with personal goal of being able to attend zumba and other dance classes (02/08/2015)   Time 6   Period Weeks   Status Achieved   PT LONG TERM GOAL  #5   Title She will increase her FOTO score to > 63 to demonstrate improved functional capacity upon discharge (02/08/2015)   Baseline inital 32   Time 6   Period Weeks   Status Partially Met               Plan - 03/01/15 1726    Clinical Impression Statement Melanie [name] has made great progress with no pain and increase hip and knee AROM and strength. she met all goals except for partially meeting LTG #4. She reports that she is able to maintain and progress her current level of function and no longer requires physical therapy.    PT Next Visit Plan D/C   PT Home Exercise Plan HEP   Consulted and Agree with Plan of Care Patient        Problem List Patient Active Problem List   Diagnosis Date Noted  . Migraine 02/28/2015  . Quadriceps tendinitis 11/16/2014  . Neck pain 07/07/2014  . Concussion with no loss of consciousness 07/07/2014  . Nonallopathic lesion of lumbosacral region 11/04/2013  . Nonallopathic lesion of thoracic region 11/04/2013  . Piriformis syndrome of left side 10/14/2013  . Greater trochanteric bursitis of left hip 10/14/2013  . Trapezius muscle spasm 11/26/2012  . Nonallopathic lesion of cervicothoracic region 11/26/2012  . Seasonal allergies 09/28/2012  . Asthma attack 12/01/2011  . Backache 09/26/2009  . OTHER ACQUIRED DEFORMITY OF ANKLE AND FOOT OTHER 09/26/2009  . ABNORMALITY OF GAIT 09/26/2009  . KNEE PAIN, RIGHT 08/08/2009  . TENDINITIS, PATELLAR 08/08/2009   Starr Lake PT, DPT, LAT, ATC  03/01/2015  5:30 PM      [facility] [address] [place], [place], [address] Phone: [phone]   Fax:  [phone]         PHYSICAL THERAPY DISCHARGE SUMMARY  Visits from Start of Care: 12   Current functional level related to goals / functional outcomes: FOTO 43% limited    Remaining deficits: Mild intermittent tightness and swelling   Education / Equipment: HEP, theraband for  strengthening.   Plan: Patient agrees to discharge.  Patient goals were partially met. Patient is being discharged due to meeting the stated rehab goals.  ?????        Kristoffer Leamon PT, DPT, LAT, ATC  03/01/2015  5:33 PM

## 2015-03-01 NOTE — Patient Instructions (Signed)
   Kristoffer Leamon PT, DPT, LAT, ATC  [facility] Phone: [phone]     

## 2015-04-04 ENCOUNTER — Ambulatory Visit: Payer: 59 | Admitting: Family Medicine

## 2015-04-04 DIAGNOSIS — Z0289 Encounter for other administrative examinations: Secondary | ICD-10-CM

## 2015-04-05 ENCOUNTER — Ambulatory Visit: Payer: 59 | Admitting: Family Medicine

## 2015-04-19 ENCOUNTER — Encounter: Payer: Self-pay | Admitting: Family Medicine

## 2015-04-19 ENCOUNTER — Ambulatory Visit (INDEPENDENT_AMBULATORY_CARE_PROVIDER_SITE_OTHER): Payer: 59 | Admitting: Family Medicine

## 2015-04-19 VITALS — BP 126/78 | HR 87 | Ht 61.5 in | Wt 158.0 lb

## 2015-04-19 DIAGNOSIS — M9903 Segmental and somatic dysfunction of lumbar region: Secondary | ICD-10-CM

## 2015-04-19 DIAGNOSIS — M6248 Contracture of muscle, other site: Secondary | ICD-10-CM | POA: Diagnosis not present

## 2015-04-19 DIAGNOSIS — M9902 Segmental and somatic dysfunction of thoracic region: Secondary | ICD-10-CM | POA: Diagnosis not present

## 2015-04-19 DIAGNOSIS — M999 Biomechanical lesion, unspecified: Secondary | ICD-10-CM

## 2015-04-19 DIAGNOSIS — M62838 Other muscle spasm: Secondary | ICD-10-CM

## 2015-04-19 DIAGNOSIS — M9901 Segmental and somatic dysfunction of cervical region: Secondary | ICD-10-CM

## 2015-04-19 NOTE — Assessment & Plan Note (Signed)
Having mild spasming of the trapezius. Patient hasn't topical anti-inflammatories. I do think the patient likely has some mild iron deficiency and we discussed possibly treating this as well. We discussed icing regimen. Postural changes. Patient elected to 4 weeks for further evaluation and treatment.

## 2015-04-19 NOTE — Progress Notes (Signed)
Pre visit review using our clinic review tool, if applicable. No additional management support is needed unless otherwise documented below in the visit note. 

## 2015-04-19 NOTE — Assessment & Plan Note (Signed)
Decision today to treat with OMT was based on Physical Exam  After verbal consent patient was treated with HVLA, ME techniques in cervical, thoracic, lumbar and sacral areas  Patient tolerated the procedure well with improvement in symptoms  Patient given exercises, stretches and lifestyle modifications  See medications in patient instructions if given  Patient will follow up in 4-6 weeks    

## 2015-04-19 NOTE — Progress Notes (Signed)
Tawana ScaleZach Kayse Puccini D.O. [facility] [address] [place], [place] [address] Phone: [phone] Subjective:     CC: Right knee pain follow-up,  Back pain follow-up  OZH:[id number]:Subjective Melanie D Pernell Dupredams is a 54 y.o. female coming in with complaint of right knee pain. Had quadriceps tendinitis.  Doing much better at this time   Continues to have a dull aching sensation in the neck and back.  Has responded well to osteopathic manipulation. Patient is having increasing neck tightness and stiffness. Some mild increasing fatigue as well. Seems to be somewhat related to stress of the weather. Has had more migraines. Nothing that stopping her from activity but states that she has not is excited about going to the gym because of the discomfort.    Past medical history, social, surgical and family history all reviewed in electronic medical record.   Review of Systems: No headache, visual changes, nausea, vomiting, diarrhea, constipation, dizziness, abdominal pain, skin rash, fevers, chills, night sweats, weight loss, swollen lymph nodes, body aches, joint swelling, muscle aches, chest pain, shortness of breath, mood changes.   Objective Blood pressure 126/78, pulse 87, height 5' 1.5" (1.562 m), weight 158 lb (71.668 kg), SpO2 99 %.  General: No apparent distress alert and oriented x3 mood and affect normal, dressed appropriately.  HEENT: Pupils equal, extraocular movements intact  Respiratory: Patient's speak in full sentences and does not appear short of breath  Cardiovascular: No lower extremity edema, non tender, no erythema  Skin: Warm dry intact with no signs of infection or rash on extremities or on axial skeleton.  Abdomen: Soft nontender  Neuro: Cranial nerves II through XII are intact, neurovascularly intact in all extremities with 2+ DTRs and 2+ pulses.  Lymph: No lymphadenopathy of posterior or anterior cervical chain or axillae bilaterally.  Gait normal with good balance and  coordination.  MSK:  Non tender with full range of motion and good stability and symmetric strength and tone of shoulders, elbows, wrist, hip, and ankles bilaterally.  Knee: Right Normal to inspection with no erythema or effusion or obvious bony abnormalities. Incision well-healed No tenderness over the quadriceps tendon ROM full in flexion and extension and lower leg rotation. Ligaments with solid consistent endpoints including ACL, PCL, LCL, MCL. Negative Mcmurray's, Apley's, and Thessalonian tests. Able tenderness over the patella. Patellar glide significantly less crepitus Patellar and quadriceps tendons unremarkable. Hamstring and quadriceps strength is normal.  Contralateral knee unremarkable    Neck: Inspection unremarkable. No palpable stepoffs. Negative Spurling's maneuver. Full neck range of motion Grip strength and sensation normal in bilateral hands Strength good C4 to T1 distribution No sensory change to C4 to T1 Negative Hoffman sign bilaterally Reflexes normal  mild increasing tightness of the trapezius mostly on the right side but bilaterally.  Back Exam:  Inspection: Unremarkable  Motion: Flexion 45 deg, Extension 45 deg, Side Bending to 45 deg bilaterally,  Rotation to 45 deg bilaterally  SLR laying: Negative  XSLR laying: Negative  Palpable tenderness: Mild tenderness of the paraspinal musculature of the cervical and thoracic. Stated above in worse than previous exam. FABER: negative  Sensory change: Gross sensation intact to all lumbar and sacral dermatomes.  Reflexes: 2+ at both patellar tendons, 2+ at achilles tendons, Babinski's downgoing.  Strength at foot  Plantar-flexion: 5/5 Dorsi-flexion: 5/5 Eversion: 5/5 Inversion: 5/5  Leg strength  Quad: 5/5 Hamstring: 5/5 Hip flexor: 5/5 Hip abductors: 4+/5  Gait unremarkable.  Osteopathic findings Cervical C2 flexed rotated and side bent left  C4 flexed rotated and side bent  right  Thoracic T3 extended rotated and side bent left inhale third rib T7 extended rotated and side bent right  Lumbar L2 flexed rotated and side bent right  Sacrum Left on left    Impression and Recommendations:     This case required medical decision making of moderate complexity.

## 2015-04-19 NOTE — Patient Instructions (Signed)
Good to see you Ice when you need it Iron 65mg  daily with 500mg  of vitamin C to help with absorption See me again in 3 weeks.

## 2015-04-20 ENCOUNTER — Telehealth: Payer: Self-pay | Admitting: Family Medicine

## 2015-04-20 MED ORDER — IRON 325 (65 FE) MG PO TABS: 1.0000 | ORAL_TABLET | Freq: Every day | ORAL | Status: DC

## 2015-04-20 MED ORDER — VITAMIN C 500 MG PO TABS: 500.0000 mg | ORAL_TABLET | Freq: Every day | ORAL | Status: DC

## 2015-04-20 NOTE — Telephone Encounter (Signed)
Patient is being advised that her bene card will not cover the otc meds recommended yesterday without a written script proving medical necessity  Iron 65mg  daily with 500mg  of vitamin C  Please write this in script form for the patient, so that she can get it covered via bene card

## 2015-04-20 NOTE — Telephone Encounter (Signed)
rx sent into pharmacy. Pt made aware.  

## 2015-04-24 ENCOUNTER — Telehealth: Payer: Self-pay | Admitting: Internal Medicine

## 2015-04-24 NOTE — Telephone Encounter (Signed)
Left message suggesting to cut the number of days taking the Iron in half, so if taking 7days to goto 3days. If she is having issues other than constipation to consider calling us back.

## 2015-04-24 NOTE — Telephone Encounter (Signed)
Pt called in and wanted to know if there is any other way she can take the iron.  It is killing her stomach   Best number is work number

## 2015-04-27 ENCOUNTER — Telehealth: Payer: Self-pay | Admitting: Internal Medicine

## 2015-04-27 DIAGNOSIS — D509 Iron deficiency anemia, unspecified: Secondary | ICD-10-CM

## 2015-04-27 NOTE — Telephone Encounter (Signed)
Pt called and would like to know if Dr Katrinka Blazingsmith could pt in a order for a lab to check her iron?

## 2015-04-30 ENCOUNTER — Other Ambulatory Visit (INDEPENDENT_AMBULATORY_CARE_PROVIDER_SITE_OTHER): Payer: 59

## 2015-04-30 DIAGNOSIS — D509 Iron deficiency anemia, unspecified: Secondary | ICD-10-CM

## 2015-04-30 LAB — IBC PANEL
Iron: 62 ug/dL (ref 42–145)
Saturation Ratios: 20.4 % (ref 20.0–50.0)
Transferrin: 217 mg/dL (ref 212.0–360.0)

## 2015-04-30 LAB — FERRITIN: FERRITIN: 110.1 ng/mL (ref 10.0–291.0)

## 2015-04-30 NOTE — Telephone Encounter (Signed)
Orders entered. Pt made aware.

## 2015-05-10 ENCOUNTER — Encounter: Payer: Self-pay | Admitting: Family Medicine

## 2015-05-10 ENCOUNTER — Ambulatory Visit (INDEPENDENT_AMBULATORY_CARE_PROVIDER_SITE_OTHER): Payer: 59 | Admitting: Family Medicine

## 2015-05-10 VITALS — BP 122/78 | HR 95 | Ht 61.5 in | Wt 158.0 lb

## 2015-05-10 DIAGNOSIS — M9901 Segmental and somatic dysfunction of cervical region: Secondary | ICD-10-CM

## 2015-05-10 DIAGNOSIS — M9902 Segmental and somatic dysfunction of thoracic region: Secondary | ICD-10-CM

## 2015-05-10 DIAGNOSIS — G5702 Lesion of sciatic nerve, left lower limb: Secondary | ICD-10-CM

## 2015-05-10 DIAGNOSIS — M542 Cervicalgia: Secondary | ICD-10-CM

## 2015-05-10 DIAGNOSIS — M999 Biomechanical lesion, unspecified: Secondary | ICD-10-CM

## 2015-05-10 DIAGNOSIS — M9903 Segmental and somatic dysfunction of lumbar region: Secondary | ICD-10-CM

## 2015-05-10 NOTE — Progress Notes (Signed)
  Tawana ScaleZach Sarayu Prevost D.O. [facility] [address] [place], [place] [address] Phone: [phone] Subjective:     CC: Back pain follow-up  WUX:[id number]:Subjective Melanie [name] is a 54 y.o. female coming in with complaint of .[name].   Continues to have a dull aching sensation in the neck and back.  Has responded well to osteopathic manipulation. Patient hasn't and working on a more regular basis. Doing better overall. Having some mild increased tightness of the lower back more than the neck today. No radiation. Nothing that is stopping her from activities. Seems to be finally making some headway.    Past medical history, social, surgical and family history all reviewed in electronic medical record.   Review of Systems: No headache, visual changes, nausea, vomiting, diarrhea, constipation, dizziness, abdominal pain, skin rash, fevers, chills, night sweats, weight loss, swollen lymph nodes, body aches, joint swelling, muscle aches, chest pain, shortness of breath, mood changes.   Objective Blood pressure 122/78, weight 158 lb (71.668 kg).  General: No apparent distress alert and oriented x3 mood and affect normal, dressed appropriately.  HEENT: Pupils equal, extraocular movements intact  Respiratory: Patient's speak in full sentences and does not appear short of breath  Cardiovascular: No lower extremity edema, non tender, no erythema  Skin: Warm dry intact with no signs of infection or rash on extremities or on axial skeleton.  Abdomen: Soft nontender  Neuro: Cranial nerves II through XII are intact, neurovascularly intact in all extremities with 2+ DTRs and 2+ pulses.  Lymph: No lymphadenopathy of posterior or anterior cervical chain or axillae bilaterally.  Gait normal with good balance and coordination.  MSK:  Non tender with full range of motion and good stability and symmetric strength and tone of shoulders, elbows, wrist, knee,  hip, and ankles bilaterally.      Neck: Inspection  unremarkable. No palpable stepoffs. Negative Spurling's maneuver. Full neck range of motion Grip strength and sensation normal in bilateral hands Strength good C4 to T1 distribution No sensory change to C4 to T1 Negative Hoffman sign bilaterally Reflexes normal Continued mild tightness of the trapezius but seems to be more symmetric than previous exam.  Back Exam:  Inspection: Unremarkable  Motion: Flexion 45 deg, Extension 45 deg, Side Bending to 45 deg bilaterally,  Rotation to 45 deg bilaterally  SLR laying: Negative  XSLR laying: Negative  Palpable tenderness: Moderate tenderness to the paraspinal musculature of the thoracic and lumbar but not as much attending the cervical today. FABER: Positive today which is the first time in quite some time. Sensory change: Gross sensation intact to all lumbar and sacral dermatomes.  Reflexes: 2+ at both patellar tendons, 2+ at achilles tendons, Babinski's downgoing.  Strength at foot is symmetric Leg strength  Quad: 5/5 Hamstring: 5/5 Hip flexor: 5/5 Hip abductors: 4+/5  Gait unremarkable.  Osteopathic findings Cervical C2 flexed rotated and side bent left  Thoracic T3 extended rotated and side bent left inhale third rib T7 extended rotated and side bent right  Lumbar L2 flexed rotated and side bent right  Sacrum Left on left    Impression and Recommendations:     This case required medical decision making of moderate complexity.

## 2015-05-10 NOTE — Assessment & Plan Note (Addendum)
Encourage more postural control and we discussed ergonomics again. Patient will continue with the over-the-counter medications. Has tramadol for breakthrough pain

## 2015-05-10 NOTE — Progress Notes (Signed)
Pre visit review using our clinic review tool, if applicable. No additional management support is needed unless otherwise documented below in the visit note. 

## 2015-05-10 NOTE — Assessment & Plan Note (Signed)
Decision today to treat with OMT was based on Physical Exam  After verbal consent patient was treated with HVLA, ME techniques in cervical, thoracic, lumbar and sacral areas  Patient tolerated the procedure well with improvement in symptoms  Patient given exercises, stretches and lifestyle modifications  See medications in patient instructions if given  Patient will follow up in 4-6 weeks    

## 2015-05-10 NOTE — Patient Instructions (Signed)
Good to see you Melanie [name] is your friend Posture on the wall On wall with heels, butt shoulder and head touching for a goal of 5 minutes daily  With sitting a lot put tennis ball between shoulder blades Monitor at eye level.  Work out but keep Insurance account managerhands with peripheral vison See me again 4-5 weeks.

## 2015-05-10 NOTE — Assessment & Plan Note (Signed)
Patient seems to be doing relatively well. Having some mild tightness of the piriformis. We discussed starting the exercises again and working on core strength. We discussed home exercises and icing. Patient will try to make these changes and come back and see me again in 4-5 weeks for further evaluation and treatment.

## 2015-06-07 ENCOUNTER — Ambulatory Visit (INDEPENDENT_AMBULATORY_CARE_PROVIDER_SITE_OTHER): Payer: 59 | Admitting: Family Medicine

## 2015-06-07 ENCOUNTER — Encounter: Payer: Self-pay | Admitting: Family Medicine

## 2015-06-07 VITALS — BP 128/72 | HR 75 | Ht 61.5 in | Wt 157.0 lb

## 2015-06-07 DIAGNOSIS — M9903 Segmental and somatic dysfunction of lumbar region: Secondary | ICD-10-CM

## 2015-06-07 DIAGNOSIS — M9902 Segmental and somatic dysfunction of thoracic region: Secondary | ICD-10-CM | POA: Diagnosis not present

## 2015-06-07 DIAGNOSIS — M9901 Segmental and somatic dysfunction of cervical region: Secondary | ICD-10-CM

## 2015-06-07 DIAGNOSIS — M542 Cervicalgia: Secondary | ICD-10-CM | POA: Diagnosis not present

## 2015-06-07 DIAGNOSIS — M999 Biomechanical lesion, unspecified: Secondary | ICD-10-CM

## 2015-06-07 NOTE — Progress Notes (Signed)
Tawana ScaleZach Smith D.O. [facility] [address] [place], [place] [address] Phone: [phone] Subjective:     CC: Back pain follow-up  BJY:[id number]:Subjective Janara D Pernell Dupredams is a 54 y.o. female coming in with complaint of .[name].   Neck pain mostly. Seems to be more uncomfortable in usual. Patient had been doing new exercises. Patient has been using a trampoline and has enjoyed and but has noticed that when she is doing some lifting on and she is having some mild discomfort. Denies any radiation into her arms. Denies any weakness. Just worsening of previous symptoms.   Past Medical History  Diagnosis Date  . Allergy   . Asthma   . Osteoporosis    Past Surgical History  Procedure Laterality Date  . Appendectomy    . Abdominal hysterectomy    . Knee surgery     Social History  Substance Use Topics  . Smoking status: Never Smoker   . Smokeless tobacco: None  . Alcohol Use: Yes     Comment: 2/wk   Allergies  Allergen Reactions  . Penicillins   . Sulfonamide Derivatives    Family History  Problem Relation Age of Onset  . Heart disease Father   . Irregular heart beat Mother   . Stroke Maternal Grandmother   . Heart attack Maternal Grandfather   . Stroke Paternal Grandmother   . Heart attack Paternal Grandfather   . Heart attack Brother     Past medical history, social, surgical and family history all reviewed in electronic medical record.   Review of Systems: No headache, visual changes, nausea, vomiting, diarrhea, constipation, dizziness, abdominal pain, skin rash, fevers, chills, night sweats, weight loss, swollen lymph nodes, body aches, joint swelling, muscle aches, chest pain, shortness of breath, mood changes.   Objective Blood pressure 128/72, pulse 75, height 5' 1.5" (1.562 m), weight 157 lb (71.215 kg), SpO2 99 %.  General: No apparent distress alert and oriented x3 mood and affect normal, dressed appropriately.  HEENT: Pupils equal, extraocular movements intact   Respiratory: Patient's speak in full sentences and does not appear short of breath  Cardiovascular: No lower extremity edema, non tender, no erythema  Skin: Warm dry intact with no signs of infection or rash on extremities or on axial skeleton.  Abdomen: Soft nontender  Neuro: Cranial nerves II through XII are intact, neurovascularly intact in all extremities with 2+ DTRs and 2+ pulses.  Lymph: No lymphadenopathy of posterior or anterior cervical chain or axillae bilaterally.  Gait normal with good balance and coordination.  MSK:  Non tender with full range of motion and good stability and symmetric strength and tone of shoulders, elbows, wrist, knee,  hip, and ankles bilaterally.      Neck: Inspection unremarkable. No palpable stepoffs. Negative Spurling's maneuver. Full neck range of motion Grip strength and sensation normal in bilateral hands Strength good C4 to T1 distribution No sensory change to C4 to T1 Negative Hoffman sign bilaterally Reflexes normal Continued mild tightness of the trapezius but seems to be more symmetric than previous exam.  Back Exam:  Inspection: Unremarkable  Motion: Flexion 45 deg, Extension 45 deg, Side Bending to 45 deg bilaterally,  Rotation to 45 deg bilaterally  SLR laying: Negative  XSLR laying: Negative  Palpable tenderness:more tightness of the thoracic and cervical spine but less in the lumbar spine. FABER: negative today which is an improvement again. Sensory change: Gross sensation intact to all lumbar and sacral dermatomes.  Reflexes: 2+ at both patellar  tendons, 2+ at achilles tendons, Babinski's downgoing.  Strength at foot is symmetric Leg strength  Quad: 5/5 Hamstring: 5/5 Hip flexor: 5/5 Hip abductors: 4+/5  Gait unremarkable.  Osteopathic findings Cervical C2 flexed rotated and side bent left  Thoracic T3 extended rotated and side bent left inhale third rib T7 extended rotated and side bent right  Lumbar L2  flexed rotated and side bent right  Sacrum Left on left    Impression and Recommendations:     This case required medical decision making of moderate complexity.

## 2015-06-07 NOTE — Patient Instructions (Signed)
Good to see you You are doing great overall.  Do not know what you did with the rib but got it in place Continue everything else you are doing I think the weight will come off Lab Results  Component Value Date   TSH 1.04 08/11/2014  Which is normal.  See me again in 4-5 weeks.  Happy New Year!

## 2015-06-07 NOTE — Assessment & Plan Note (Signed)
Decision today to treat with OMT was based on Physical Exam  After verbal consent patient was treated with HVLA, ME techniques in cervical, thoracic, lumbar and sacral areas  Patient tolerated the procedure well with improvement in symptoms  Patient given exercises, stretches and lifestyle modifications  See medications in patient instructions if given  Patient will follow up in 4-6 weeks    

## 2015-06-07 NOTE — Progress Notes (Signed)
Pre visit review using our clinic review tool, if applicable. No additional management support is needed unless otherwise documented below in the visit note. 

## 2015-06-07 NOTE — Assessment & Plan Note (Signed)
Worsening neck pain at this time. We discussed home exercises, icing protocol, which activities to do a which was to avoid. We discussed keeping neck in a neutral position certain activities. Patient and will come back and see me again in 4 weeks for further evaluation and treatment.noted for formal physical therapy at this time. Patient will start doing exercises more regularly.

## 2015-06-24 ENCOUNTER — Ambulatory Visit (INDEPENDENT_AMBULATORY_CARE_PROVIDER_SITE_OTHER): Payer: 59 | Admitting: Physician Assistant

## 2015-06-24 VITALS — BP 110/74 | HR 85 | Temp 98.2°F | Resp 18 | Ht 61.5 in | Wt 155.0 lb

## 2015-06-24 DIAGNOSIS — R059 Cough, unspecified: Secondary | ICD-10-CM

## 2015-06-24 DIAGNOSIS — H578 Other specified disorders of eye and adnexa: Secondary | ICD-10-CM

## 2015-06-24 DIAGNOSIS — R05 Cough: Secondary | ICD-10-CM | POA: Diagnosis not present

## 2015-06-24 DIAGNOSIS — Z8709 Personal history of other diseases of the respiratory system: Secondary | ICD-10-CM

## 2015-06-24 DIAGNOSIS — H5789 Other specified disorders of eye and adnexa: Secondary | ICD-10-CM

## 2015-06-24 LAB — POCT CBC
Granulocyte percent: 57.4 %G (ref 37–80)
HCT, POC: 35.5 % — AB (ref 37.7–47.9)
Hemoglobin: 12.1 g/dL — AB (ref 12.2–16.2)
Lymph, poc: 1.7 (ref 0.6–3.4)
MCH, POC: 31 pg (ref 27–31.2)
MCHC: 34.1 g/dL (ref 31.8–35.4)
MCV: 90.9 fL (ref 80–97)
MID (CBC): 0.3 (ref 0–0.9)
MPV: 8.4 fL (ref 0–99.8)
PLATELET COUNT, POC: 147 10*3/uL (ref 142–424)
POC Granulocyte: 2.6 (ref 2–6.9)
POC LYMPH %: 35.9 % (ref 10–50)
POC MID %: 6.7 % (ref 0–12)
RBC: 3.9 M/uL — AB (ref 4.04–5.48)
RDW, POC: 13.4 %
WBC: 4.6 10*3/uL (ref 4.6–10.2)

## 2015-06-24 MED ORDER — NAPROXEN 500 MG PO TABS: 500.0000 mg | ORAL_TABLET | Freq: Two times a day (BID) | ORAL | Status: DC

## 2015-06-24 MED ORDER — IPRATROPIUM BROMIDE 0.02 % IN SOLN
0.5000 mg | Freq: Once | RESPIRATORY_TRACT | Status: AC
  Administered 2015-06-24: 0.5 mg via RESPIRATORY_TRACT

## 2015-06-24 MED ORDER — POLYMYXIN B-TRIMETHOPRIM 10000-0.1 UNIT/ML-% OP SOLN
1.0000 [drp] | Freq: Four times a day (QID) | OPHTHALMIC | Status: DC
Start: 2015-06-24 — End: 2015-07-12

## 2015-06-24 MED ORDER — HYDROCODONE-HOMATROPINE 5-1.5 MG/5ML PO SYRP: 2.5000 mL | ORAL_SOLUTION | Freq: Every day | ORAL | Status: AC

## 2015-06-24 MED ORDER — PREDNISONE 20 MG PO TABS: 40.0000 mg | ORAL_TABLET | Freq: Every day | ORAL | Status: DC

## 2015-06-24 MED ORDER — ALBUTEROL SULFATE (2.5 MG/3ML) 0.083% IN NEBU
1.2500 mg | INHALATION_SOLUTION | Freq: Once | RESPIRATORY_TRACT | Status: AC
  Administered 2015-06-24: 1.25 mg via RESPIRATORY_TRACT

## 2015-06-24 NOTE — Progress Notes (Signed)
06/25/2015 6:08 PM   DOB: [birth year] / MRN: [id number]  SUBJECTIVE:  Melanie [name] is a 55 y.o. female presenting for right sided eye irritation and left ear pain.  Reports that she also has nasal congestion and mild cough.  Reports "grittiness in the right eye" and feels this is getting worse.  She has not tried anything for this symptoms.    She is allergic to latex; penicillins; and sulfonamide derivatives.   She  has a past medical history of Allergy; Asthma; and Osteoporosis.    She  reports that she has never smoked. She does not have any smokeless tobacco history on file. She reports that she drinks alcohol. She reports that she does not use illicit drugs. She  has no sexual activity history on file. The patient  has past surgical history that includes Appendectomy; Abdominal hysterectomy; and Knee surgery.  Her family history includes Heart attack in her brother, maternal grandfather, and paternal grandfather; Heart disease in her father; Irregular heart beat in her mother; Stroke in her maternal grandmother and paternal grandmother.  Review of Systems  Constitutional: Negative for fever and chills.  Eyes: Positive for discharge. Negative for blurred vision, double vision, photophobia, pain and redness.  Respiratory: Negative for cough and shortness of breath.   Cardiovascular: Negative for chest pain.  Gastrointestinal: Negative for nausea and abdominal pain.  Genitourinary: Negative for dysuria, urgency and frequency.  Musculoskeletal: Negative for myalgias.  Skin: Negative for rash.  Neurological: Negative for dizziness, tingling and headaches.  Psychiatric/Behavioral: Negative for depression. The patient is not nervous/anxious.     Problem list and medications reviewed and updated by myself where necessary, and exist elsewhere in the encounter.   OBJECTIVE:  BP 110/74 mmHg  Pulse 85  Temp(Src) 98.2 F (36.8 C) (Oral)  Resp 18  Ht 5' 1.5" (1.562 m)  Wt 155 lb  (70.308 kg)  BMI 28.82 kg/m2  SpO2 97%  Physical Exam  Constitutional: She is oriented to person, place, and time. She appears well-nourished. No distress.  HENT:  Right Ear: Hearing and tympanic membrane normal.  Left Ear: Hearing and tympanic membrane normal.  Nose: Nose normal.  Mouth/Throat: Uvula is midline, oropharynx is clear and moist and mucous membranes are normal.  Eyes: EOM are normal. Pupils are equal, round, and reactive to light.  Cardiovascular: Normal rate and regular rhythm.   Pulmonary/Chest: Effort normal and breath sounds normal.  Abdominal: She exhibits no distension.  Neurological: She is alert and oriented to person, place, and time. No cranial nerve deficit. Gait normal.  Skin: Skin is dry. She is not diaphoretic.  Psychiatric: She has a normal mood and affect.  Vitals reviewed.    Visual Acuity Screening   Right eye Left eye Both eyes  Without correction: 20/15 20/15 20/15   With correction:        Results for orders placed or performed in visit on 06/24/15 (from the past 48 hour(s))  POCT CBC     Status: Abnormal   Collection Time: 06/24/15  3:26 PM  Result Value Ref Range   WBC 4.6 4.6 - 10.2 K/uL   Lymph, poc 1.7 0.6 - 3.4   POC LYMPH PERCENT 35.9 10 - 50 %L   MID (cbc) 0.3 0 - 0.9   POC MID % 6.7 0 - 12 %M   POC Granulocyte 2.6 2 - 6.9   Granulocyte percent 57.4 37 - 80 %G   RBC 3.90 (A) 4.04 - 5.48 M/uL  Hemoglobin 12.1 (A) 12.2 - 16.2 g/dL   HCT, POC 16.1 (A) 09.6 - 47.9 %   MCV 90.9 80 - 97 fL   MCH, POC 31.0 27 - 31.2 pg   MCHC 34.1 31.8 - 35.4 g/dL   RDW, POC 04.5 %   Platelet Count, POC 147 142 - 424 K/uL   MPV 8.4 0 - 99.8 fL    ASSESSMENT AND PLAN  Melanie [name] was seen today for conjunctivitis, ear pain, cough and sore throat.  Diagnoses and all orders for this visit:  Cough: Her symptoms are minimal and objective information is reassuring.  Will provide eyedrops however advised that she attempt a warm compress and give this  24-48 hours to resolve.  She has a history of asthma.  Will treat for asthma exacerbation.  -     POCT CBC -     albuterol (PROVENTIL) (2.5 MG/3ML) 0.083% nebulizer solution 1.25 mg; Take 1.5 mLs (1.25 mg total) by nebulization once. -     ipratropium (ATROVENT) nebulizer solution 0.5 mg; Take 2.5 mLs (0.5 mg total) by nebulization once. -     naproxen (NAPROSYN) 500 MG tablet; Take 1 tablet (500 mg total) by mouth 2 (two) times daily with a meal. -     HYDROcodone-homatropine (HYCODAN) 5-1.5 MG/5ML syrup; Take 2.5-5 mLs by mouth at bedtime. -     predniSONE (DELTASONE) 20 MG tablet; Take 2 tablets (40 mg total) by mouth daily with breakfast.  Irritation of right eye -     trimethoprim-polymyxin b (POLYTRIM) ophthalmic solution; Place 1 drop into the left eye every 6 (six) hours.  History of asthma -     predniSONE (DELTASONE) 20 MG tablet; Take 2 tablets (40 mg total) by mouth daily with breakfast.    The patient was advised to call or return to clinic if she does not see an improvement in symptoms or to seek the care of the closest emergency department if she worsens with the above plan.   Deliah Boston, MHS, PA-C [facility] 06/25/2015 6:08 PM

## 2015-07-05 ENCOUNTER — Ambulatory Visit: Payer: 59 | Admitting: Family Medicine

## 2015-07-12 ENCOUNTER — Ambulatory Visit (INDEPENDENT_AMBULATORY_CARE_PROVIDER_SITE_OTHER): Payer: 59 | Admitting: Family Medicine

## 2015-07-12 ENCOUNTER — Encounter: Payer: Self-pay | Admitting: Family Medicine

## 2015-07-12 VITALS — BP 120/84 | HR 73 | Ht 61.5 in | Wt 156.0 lb

## 2015-07-12 DIAGNOSIS — M9902 Segmental and somatic dysfunction of thoracic region: Secondary | ICD-10-CM | POA: Diagnosis not present

## 2015-07-12 DIAGNOSIS — M9903 Segmental and somatic dysfunction of lumbar region: Secondary | ICD-10-CM | POA: Diagnosis not present

## 2015-07-12 DIAGNOSIS — M9901 Segmental and somatic dysfunction of cervical region: Secondary | ICD-10-CM | POA: Diagnosis not present

## 2015-07-12 DIAGNOSIS — M999 Biomechanical lesion, unspecified: Secondary | ICD-10-CM

## 2015-07-12 DIAGNOSIS — M542 Cervicalgia: Secondary | ICD-10-CM

## 2015-07-12 NOTE — Progress Notes (Signed)
[name] [address] [place], [place] [address] Phone: [phone] Subjective:     CC: Back pain follow-up  BJY:NWGNFAOZHY Melanie [name] is a 55 y.o. female coming in with complaint of .[name]   Neck pain mostly. Some increased stiffness recently. Also some mild increased stiffness secondary to her back pain. Overall not severe. Seems to be related to more weather. Has been doing fairly well. No significant muscle spasm at this time. Patient feels that if she would've waited longer though she would've had the spasm. No radiation the pain. No numbness or tingling in the fingertips.   Past Medical History  Diagnosis Date  . Allergy   . Asthma   . Osteoporosis    Past Surgical History  Procedure Laterality Date  . Appendectomy    . Abdominal hysterectomy    . Knee surgery     Social History  Substance Use Topics  . Smoking status: Never Smoker   . Smokeless tobacco: None  . Alcohol Use: Yes     Comment: 2/wk   Allergies  Allergen Reactions  . Latex   . Penicillins   . Sulfonamide Derivatives    Family History  Problem Relation Age of Onset  . Heart disease Father   . Irregular heart beat Mother   . Stroke Maternal Grandmother   . Heart attack Maternal Grandfather   . Stroke Paternal Grandmother   . Heart attack Paternal Grandfather   . Heart attack Brother     Past medical history, social, surgical and family history all reviewed in electronic medical record.   Review of Systems: No headache, visual changes, nausea, vomiting, diarrhea, constipation, dizziness, abdominal pain, skin rash, fevers, chills, night sweats, weight loss, swollen lymph nodes, body aches, joint swelling, muscle aches, chest pain, shortness of breath, mood changes.   Objective Blood pressure 120/84, pulse 73, height 5' 1.5" (1.562 m), weight 156 lb (70.761 kg), SpO2 97 %.  General: No apparent distress alert and oriented x3 mood and affect normal, dressed  appropriately.  HEENT: Pupils equal, extraocular movements intact  Respiratory: Patient's speak in full sentences and does not appear short of breath  Cardiovascular: No lower extremity edema, non tender, no erythema  Skin: Warm dry intact with no signs of infection or rash on extremities or on axial skeleton.  Abdomen: Soft nontender  Neuro: Cranial nerves II through XII are intact, neurovascularly intact in all extremities with 2+ DTRs and 2+ pulses.  Lymph: No lymphadenopathy of posterior or anterior cervical chain or axillae bilaterally.  Gait normal with good balance and coordination.  MSK:  Non tender with full range of motion and good stability and symmetric strength and tone of shoulders, elbows, wrist, knee,  hip, and ankles bilaterally.      Neck: Inspection unremarkable. No palpable stepoffs. Negative Spurling's maneuver. Mild limitation with left-sided side bending Grip strength and sensation normal in bilateral hands Strength good C4 to T1 distribution No sensory change to C4 to T1 Negative Hoffman sign bilaterally Reflexes normal Continued mild tightness of the trapezius.  Back Exam:  Inspection: Unremarkable  Motion: Flexion 45 deg, Extension 45 deg, Side Bending to 45 deg bilaterally,  Rotation to 45 deg bilaterally  SLR laying: Negative  XSLR laying: Negative  Palpable tenderness:more tightness of the thoracic and cervical spine but less in the lumbar spine. FABER: negative today which is an improvement again. Sensory change: Gross sensation intact to all lumbar and sacral dermatomes.  Reflexes: 2+ at  both patellar tendons, 2+ at achilles tendons, Babinski's downgoing.  Strength at foot is symmetric Leg strength  Quad: 5/5 Hamstring: 5/5 Hip flexor: 5/5 Hip abductors: 4+/5  Gait unremarkable.  Osteopathic findings Cervical C2 flexed rotated and side bent left C4 flexed rotated and side bent right  Thoracic T3 extended rotated and side bent  left inhale third rib T5 extended rotated and side bent right  Lumbar L2 flexed rotated and side bent right  Sacrum Left on left    Impression and Recommendations:     This case required medical decision making of moderate complexity.

## 2015-07-12 NOTE — Progress Notes (Signed)
Pre visit review using our clinic review tool, if applicable. No additional management support is needed unless otherwise documented below in the visit note. 

## 2015-07-12 NOTE — Patient Instructions (Signed)
Good to see you  Ice is your friend Continue the allergy medicines but follow flonase with the ocean spray  Continue the vitamins I think the timing was perfect and see you again in 4-5 weeks.

## 2015-07-12 NOTE — Assessment & Plan Note (Signed)
Overall mild. Patient is going to the gym on areolar basis. Encourage her to continue to work on the postural control exercises and we discussed ergonomics at work. We discussed which activities to potentially avoid. Patient has tramadol for breakthrough pain. Do not feel that we needed refill for any other type of pain medication at this point. Patient has had muscle relaxers in the past but has not like them. Patient will come back and see me again in 4-6 weeks for further evaluation and treatment. Them very well to osteopathic manipulation.

## 2015-07-12 NOTE — Assessment & Plan Note (Signed)
Decision today to treat with OMT was based on Physical Exam  After verbal consent patient was treated with HVLA, ME techniques in cervical, thoracic, lumbar and sacral areas  Patient tolerated the procedure well with improvement in symptoms  Patient given exercises, stretches and lifestyle modifications  See medications in patient instructions if given  Patient will follow up in 4-6 weeks    

## 2015-08-06 ENCOUNTER — Ambulatory Visit (INDEPENDENT_AMBULATORY_CARE_PROVIDER_SITE_OTHER): Payer: 59 | Admitting: Family Medicine

## 2015-08-06 VITALS — BP 126/80 | HR 79 | Temp 98.6°F | Resp 16 | Ht 63.0 in | Wt 155.0 lb

## 2015-08-06 DIAGNOSIS — J0100 Acute maxillary sinusitis, unspecified: Secondary | ICD-10-CM | POA: Diagnosis not present

## 2015-08-06 DIAGNOSIS — J45901 Unspecified asthma with (acute) exacerbation: Secondary | ICD-10-CM

## 2015-08-06 DIAGNOSIS — J4530 Mild persistent asthma, uncomplicated: Secondary | ICD-10-CM | POA: Diagnosis not present

## 2015-08-06 MED ORDER — HYDROCODONE-HOMATROPINE 5-1.5 MG/5ML PO SYRP: 5.0000 mL | ORAL_SOLUTION | Freq: Four times a day (QID) | ORAL | Status: DC | PRN

## 2015-08-06 MED ORDER — METHYLPREDNISOLONE ACETATE 80 MG/ML IJ SUSP
80.0000 mg | Freq: Once | INTRAMUSCULAR | Status: AC
  Administered 2015-08-06: 80 mg via INTRAMUSCULAR

## 2015-08-06 MED ORDER — BECLOMETHASONE DIPROPIONATE 40 MCG/ACT IN AERS
1.0000 | INHALATION_SPRAY | Freq: Two times a day (BID) | RESPIRATORY_TRACT | Status: DC
Start: 2015-08-06 — End: 2016-06-13

## 2015-08-06 MED ORDER — AZITHROMYCIN 250 MG PO TABS: ORAL_TABLET | ORAL | Status: DC

## 2015-08-06 MED ORDER — ALBUTEROL SULFATE HFA 108 (90 BASE) MCG/ACT IN AERS: 2.0000 | INHALATION_SPRAY | RESPIRATORY_TRACT | Status: DC | PRN

## 2015-08-06 NOTE — Progress Notes (Signed)
   HPI  Patient presents today with cough.  Patient explains that over the last 2 weeks she's had facial pain and cough. Her facial pain has slightly gotten worse and is focused around bilateral maxillary sinuses. She states that her albuterol was not helping his well as usual. She has persistent cough. She states that her facial pain is slightly improved but her cough is persistent and her feeling of wheezing and shortness of breath is persistent.  She also has chest tightness.  She is tolerating foods and fluids normally.   PMH: Smoking status noted ROS: Per HPI  Objective: BP 126/80 mmHg  Pulse 79  Temp(Src) 98.6 F (37 C) (Oral)  Resp 16  Ht  (1.6 m)  Wt 155 lb (70.308 kg)  BMI 27.46 kg/m2  SpO2 99% Gen: NAD, alert, cooperative with exam HEENT: NCAT, TMs normal bilaterally, nares clear, oropharynx clear, left-sided maxillary sinus tenderness to palpation CV: RRR, good S1/S2, no murmur Resp: nonlabored, clear, no wheezes appreciated Ext: No edema, warm Neuro: Alert and oriented, No gross deficits  Assessment and plan:  # acute sinusitis, asthma exacerbation Given combination of symptoms I'll go ahead and treat her with azithromycin, since her sinusitis seems to be improving a believe this will cover her well. IM Depo-Medrol given to help with bronchospasm induced cough as well as reduce sinus inflammation. Hycodan cough syrup for nighttime cough, cautioned against driving after taking it. Return to clinic with any worsening symptoms or further improve as expected  Asthma It looks like she's having several similar symptoms a year I recommended increasing her Qvar 2 puffs twice daily, follow-up in 2-3 months, consider LABA/ICS combo like Breo/adviar if persistent  Meds ordered this encounter  Medications  . methylPREDNISolone acetate (DEPO-MEDROL) injection 80 mg    Sig:   . albuterol (PROVENTIL HFA;VENTOLIN HFA) 108 (90 Base) MCG/ACT inhaler    Sig: Inhale  2 puffs into the lungs every 4 (four) hours as needed for wheezing or shortness of breath (cough, shortness of breath or wheezing.).    Dispense:  1 Inhaler    Refill:  1  . beclomethasone (QVAR) 40 MCG/ACT inhaler    Sig: Inhale 1 puff into the lungs 2 (two) times daily.    Dispense:  1 Inhaler    Refill:  2  . HYDROcodone-homatropine (HYCODAN) 5-1.5 MG/5ML syrup    Sig: Take 5 mLs by mouth every 6 (six) hours as needed for cough.    Dispense:  120 mL    Refill:  0    Murtis Sink, MD [facility] 08/06/2015, 4:34 PM

## 2015-08-06 NOTE — Patient Instructions (Addendum)
Great to meet you!  I am treating you for a sinus infection and asthma exacerbation.   Re-start Qvar twice daily every day, this will reduce your risk of getting steroids again.   Finish all antibiotics  Come back in 2-3 months to follow up for asthma

## 2015-08-09 ENCOUNTER — Ambulatory Visit: Payer: 59 | Admitting: Family Medicine

## 2015-08-23 ENCOUNTER — Other Ambulatory Visit (INDEPENDENT_AMBULATORY_CARE_PROVIDER_SITE_OTHER): Payer: 59

## 2015-08-23 ENCOUNTER — Ambulatory Visit (INDEPENDENT_AMBULATORY_CARE_PROVIDER_SITE_OTHER): Payer: 59 | Admitting: Family Medicine

## 2015-08-23 ENCOUNTER — Encounter: Payer: Self-pay | Admitting: Family Medicine

## 2015-08-23 VITALS — BP 122/72 | Ht 63.0 in | Wt 156.0 lb

## 2015-08-23 DIAGNOSIS — M9902 Segmental and somatic dysfunction of thoracic region: Secondary | ICD-10-CM | POA: Diagnosis not present

## 2015-08-23 DIAGNOSIS — M9901 Segmental and somatic dysfunction of cervical region: Secondary | ICD-10-CM | POA: Diagnosis not present

## 2015-08-23 DIAGNOSIS — M2550 Pain in unspecified joint: Secondary | ICD-10-CM

## 2015-08-23 DIAGNOSIS — M542 Cervicalgia: Secondary | ICD-10-CM

## 2015-08-23 DIAGNOSIS — M9903 Segmental and somatic dysfunction of lumbar region: Secondary | ICD-10-CM

## 2015-08-23 DIAGNOSIS — M999 Biomechanical lesion, unspecified: Secondary | ICD-10-CM

## 2015-08-23 LAB — C-REACTIVE PROTEIN: CRP: 0.2 mg/dL — AB (ref 0.5–20.0)

## 2015-08-23 LAB — SEDIMENTATION RATE: Sed Rate: 29 mm/hr — ABNORMAL HIGH (ref 0–22)

## 2015-08-23 NOTE — Patient Instructions (Signed)
I think you have some post infectious inflammation in your body.  Lets have you drop the weight to 50% and increase slowly for the shoulder Keep hands within peripheral vision Stay active.  See me again in 2-3 weeks and we will look at shoulder if not perfect

## 2015-08-23 NOTE — Progress Notes (Signed)
Pre visit review using our clinic review tool, if applicable. No additional management support is needed unless otherwise documented below in the visit note. 

## 2015-08-23 NOTE — Assessment & Plan Note (Signed)
Decision today to treat with OMT was based on Physical Exam  After verbal consent patient was treated with HVLA, ME techniques in cervical, thoracic, lumbar and sacral areas  Patient tolerated the procedure well with improvement in symptoms  Patient given exercises, stretches and lifestyle modifications  See medications in patient instructions if given  Patient will follow up in 4-6 weeks    

## 2015-08-23 NOTE — Assessment & Plan Note (Signed)
Patient has had multiple joint pains recently. Of the knee issues, neck issues and back issues. I do think at this time he'll be worse workup to rule out any autoimmune disease that could be contribute in. These were ordered today and if positive this can change medical management.

## 2015-08-23 NOTE — Assessment & Plan Note (Signed)
Patient continues to have neck pain that seems to be intermittent. Has had multiple other injuries and I do think that workup for autoimmune is necessary. Patient is coming continue to do the home exercises. We discussed icing regimen and what activities to week. Encourage her to do more the postural. Overall I do not see anything that is significantly concerning but still Needs to be addressed. Patient will follow-up and see me again in 4-6 weeks.

## 2015-08-23 NOTE — Progress Notes (Signed)
[name] [address] [place], [place] [address] Phone: [phone] Subjective:     CC: Back pain follow-up  [id number] Marvia D Mares is a 55 y.o. female coming in with complaint of .[name]   Neck pain Had some mild increasing tenderness recently. Overall has been doing really well. Patient states that she has recently had multiple recurrent upper respiratory type infections in with her history of asthma she is been coughing a significant amount. Patient has been on antibiotics a couple times. Continues to have a cough but is feeling better overall. Because of this though she has not been working out as regular. Patient denies any numbness or tingling.  Patient states her low back seems to be doing relatively well. Some mild tightness of the lower back but nothing that his stopping him from activities.   Past Medical History  Diagnosis Date  . Allergy   . Asthma   . Osteoporosis    Past Surgical History  Procedure Laterality Date  . Appendectomy    . Abdominal hysterectomy    . Knee surgery     Social History  Substance Use Topics  . Smoking status: Never Smoker   . Smokeless tobacco: None  . Alcohol Use: Yes     Comment: 2/wk   Allergies  Allergen Reactions  . Latex   . Penicillins   . Sulfonamide Derivatives    Family History  Problem Relation Age of Onset  . Heart disease Father   . Irregular heart beat Mother   . Stroke Maternal Grandmother   . Heart attack Maternal Grandfather   . Stroke Paternal Grandmother   . Heart attack Paternal Grandfather   . Heart attack Brother     Past medical history, social, surgical and family history all reviewed in electronic medical record.   Review of Systems: No headache, visual changes, nausea, vomiting, diarrhea, constipation, dizziness, abdominal pain, skin rash, fevers, chills, night sweats, weight loss, swollen lymph nodes, body aches, joint swelling, muscle aches, chest pain,  shortness of breath, mood changes.   Objective Blood pressure 122/72, height  (1.6 m), weight 156 lb (70.761 kg).  General: No apparent distress alert and oriented x3 mood and affect normal, dressed appropriately.  HEENT: Pupils equal, extraocular movements intact  Respiratory: Patient's speak in full sentences and does not appear short of breath Mild expiratory wheezes noted and patient is causing fairly regularly. Cardiovascular: No lower extremity edema, non tender, no erythema  Skin: Warm dry intact with no signs of infection or rash on extremities or on axial skeleton.  Abdomen: Soft nontender  Neuro: Cranial nerves II through XII are intact, neurovascularly intact in all extremities with 2+ DTRs and 2+ pulses.  Lymph: No lymphadenopathy of posterior or anterior cervical chain or axillae bilaterally.  Gait normal with good balance and coordination.  MSK:  Non tender with full range of motion and good stability and symmetric strength and tone of shoulders, elbows, wrist, knee,  hip, and ankles bilaterally.      Neck: Inspection unremarkable. No palpable stepoffs. Negative Spurling's maneuver. Mild increase in limitation of range of motion secondary to patient having discomfort. Grip strength and sensation normal in bilateral hands Strength good C4 to T1 distribution No sensory change to C4 to T1 Negative Hoffman sign bilaterally Reflexes normal Continued mild tightness of the trapezius and possibly worsen previous exams.  Back Exam:  Inspection: Unremarkable  Motion: Flexion 45 deg, Extension 45 deg, Side Bending to 45  deg bilaterally,  Rotation to 45 deg bilaterally  SLR laying: Negative  XSLR laying: Negative  Palpable tenderness very mild in the lumbar region today. FABER: negative today Sensory change: Gross sensation intact to all lumbar and sacral dermatomes.  Reflexes: 2+ at both patellar tendons, 2+ at achilles tendons, Babinski's downgoing.  Strength  at foot is symmetric Leg strength  Quad: 5/5 Hamstring: 5/5 Hip flexor: 5/5 Hip abductors: 4+/5  Gait unremarkable.  Osteopathic findings Cervical C2 flexed rotated and side bent left C4 flexed rotated and side bent right C6 flexed rotated and side bent left  Thoracic T3 extended rotated and side bent left inhale third rib T5 extended rotated and side bent right T8 extended rotated and side bent left  Lumbar L2 flexed rotated and side bent right  Sacrum Left on left    Impression and Recommendations:     This case required medical decision making of moderate complexity.

## 2015-08-24 LAB — ANGIOTENSIN CONVERTING ENZYME: Angiotensin-Converting Enzyme: 31 U/L (ref 8–52)

## 2015-08-24 LAB — CYCLIC CITRUL PEPTIDE ANTIBODY, IGG: Cyclic Citrullin Peptide Ab: <16 Units

## 2015-08-24 LAB — ANA: ANA: NEGATIVE

## 2015-08-24 LAB — ANTI-DNA ANTIBODY, DOUBLE-STRANDED: DS DNA AB: 1 [IU]/mL

## 2015-08-26 ENCOUNTER — Ambulatory Visit (INDEPENDENT_AMBULATORY_CARE_PROVIDER_SITE_OTHER): Payer: 59 | Admitting: Physician Assistant

## 2015-08-26 VITALS — BP 120/70 | HR 86 | Temp 98.1°F | Resp 20 | Ht 63.0 in | Wt 153.0 lb

## 2015-08-26 DIAGNOSIS — J069 Acute upper respiratory infection, unspecified: Secondary | ICD-10-CM | POA: Diagnosis not present

## 2015-08-26 DIAGNOSIS — R062 Wheezing: Secondary | ICD-10-CM

## 2015-08-26 MED ORDER — ALBUTEROL SULFATE (2.5 MG/3ML) 0.083% IN NEBU
2.5000 mg | INHALATION_SOLUTION | Freq: Once | RESPIRATORY_TRACT | Status: AC
  Administered 2015-08-26: 1.25 mg via RESPIRATORY_TRACT

## 2015-08-26 MED ORDER — METHYLPREDNISOLONE ACETATE 80 MG/ML IJ SUSP
80.0000 mg | Freq: Once | INTRAMUSCULAR | Status: AC
  Administered 2015-08-26: 80 mg via INTRAMUSCULAR

## 2015-08-26 MED ORDER — IPRATROPIUM BROMIDE 0.02 % IN SOLN
0.5000 mg | Freq: Once | RESPIRATORY_TRACT | Status: AC
  Administered 2015-08-26: 0.25 mg via RESPIRATORY_TRACT

## 2015-08-26 NOTE — Progress Notes (Signed)
08/26/2015 1:43 PM   DOB: Oct 30, 1960 / MRN: [id number]  SUBJECTIVE:  Melanie [name] is a 55 y.o. female with a history of asthmapresenting for cough that started two weeks ago with nasal congestion and sore throat.  She has a history of asthma.  Has tried using her QVAR, rescue inhlaer, and claritin with good relief of her upper airway symptoms, however she reports the cough is lingering.    She is allergic to latex; penicillins; and sulfonamide derivatives.   She  has a past medical history of Allergy; Asthma; and Osteoporosis.    She  reports that she has never smoked. She does not have any smokeless tobacco history on file. She reports that she drinks alcohol. She reports that she does not use illicit drugs. She  has no sexual activity history on file. The patient  has past surgical history that includes Appendectomy; Abdominal hysterectomy; and Knee surgery.  Her family history includes Heart attack in her brother, maternal grandfather, and paternal grandfather; Heart disease in her father; Irregular heart beat in her mother; Stroke in her maternal grandmother and paternal grandmother.  Review of Systems  Constitutional: Negative for fever and chills.  Eyes: Negative for blurred vision.  Respiratory: Negative for cough and shortness of breath.   Cardiovascular: Negative for chest pain.  Gastrointestinal: Negative for nausea and abdominal pain.  Genitourinary: Negative for dysuria, urgency and frequency.  Musculoskeletal: Negative for myalgias.  Skin: Negative for rash.  Neurological: Negative for dizziness, tingling and headaches.  Psychiatric/Behavioral: Negative for depression. The patient is not nervous/anxious.     Problem list and medications reviewed and updated by myself where necessary, and exist elsewhere in the encounter.   OBJECTIVE:  BP 120/70 mmHg  Pulse 86  Temp(Src) 98.1 F (36.7 C) (Oral)  Resp 20  Ht  (1.6 m)  Wt 153 lb (69.4 kg)  BMI 27.11 kg/m2   SpO2 98%  Physical Exam  Constitutional: She is oriented to person, place, and time. She appears well-nourished. No distress.  Eyes: EOM are normal. Pupils are equal, round, and reactive to light.  Cardiovascular: Normal rate and regular rhythm.   Pulmonary/Chest: Effort normal. No respiratory distress. She has wheezes (faint, end expiratory). She has no rales.  Abdominal: She exhibits no distension.  Musculoskeletal: Normal range of motion.  Neurological: She is alert and oriented to person, place, and time. No cranial nerve deficit. Gait normal.  Skin: Skin is dry. She is not diaphoretic.  Psychiatric: She has a normal mood and affect.  Vitals reviewed.     No results found.  ASSESSMENT AND PLAN  [name] was seen today for follow-up.  Diagnoses and all orders for this visit:  Protracted URI: Most likely driving process two. She has a history of asthma.  Will treat to reduce bronchospasm.  RTC in 24-48 hours if symptoms are persisting.    Wheezing:  -     albuterol (PROVENTIL) (2.5 MG/3ML) 0.083% nebulizer solution 2.5 mg; Take 3 mLs (2.5 mg total) by nebulization once. -     ipratropium (ATROVENT) nebulizer solution 0.5 mg; Take 2.5 mLs (0.5 mg total) by nebulization once. -     methylPREDNISolone acetate (DEPO-MEDROL) injection 80 mg; Inject 1 mL (80 mg total) into the muscle once.      The patient was advised to call or return to clinic if she does not see an improvement in symptoms or to seek the care of the closest emergency department if she worsens with the  above plan.   Deliah BostonMichael Nealy Karapetian, MHS, PA-C [facility] 08/26/2015 1:43 PM

## 2015-08-26 NOTE — Patient Instructions (Signed)
     IF you received an x-ray today, you will receive an invoice from [facility]. Please contact [facility] at [phone] with questions or concerns regarding your invoice.   IF you received labwork today, you will receive an invoice from [facility]. Please contact [facility] at [phone] with questions or concerns regarding your invoice.   Our billing staff will not be able to assist you with questions regarding bills from these companies.  You will be contacted with the lab results as soon as they are available. The fastest way to get your results is to activate your My Chart account. Instructions are located on the last page of this paperwork. If you have not heard from us regarding the results in 2 weeks, please contact this office.      

## 2015-09-06 ENCOUNTER — Ambulatory Visit (INDEPENDENT_AMBULATORY_CARE_PROVIDER_SITE_OTHER): Payer: 59 | Admitting: Family Medicine

## 2015-09-06 ENCOUNTER — Encounter: Payer: Self-pay | Admitting: Family Medicine

## 2015-09-06 VITALS — BP 120/78 | HR 76 | Wt 154.0 lb

## 2015-09-06 DIAGNOSIS — M999 Biomechanical lesion, unspecified: Secondary | ICD-10-CM

## 2015-09-06 DIAGNOSIS — M9901 Segmental and somatic dysfunction of cervical region: Secondary | ICD-10-CM | POA: Diagnosis not present

## 2015-09-06 DIAGNOSIS — M9902 Segmental and somatic dysfunction of thoracic region: Secondary | ICD-10-CM | POA: Diagnosis not present

## 2015-09-06 DIAGNOSIS — M9903 Segmental and somatic dysfunction of lumbar region: Secondary | ICD-10-CM | POA: Diagnosis not present

## 2015-09-06 DIAGNOSIS — M542 Cervicalgia: Secondary | ICD-10-CM | POA: Diagnosis not present

## 2015-09-06 NOTE — Assessment & Plan Note (Signed)
Encourage patient to try some other shoes. We discussed over-the-counter orthotics. I think that she will respond well to this and probably will not need other orthotics.

## 2015-09-06 NOTE — Assessment & Plan Note (Signed)
Overall patient is doing relatively well. I do think the patient is having more difficulty with the posture. Patient will be working with a physical fitness instructor and we did discuss about the ergonomics and patient is going to watch her lifting. Overall patient is supposed to continue the vitamins up limitations. We discussed icing regimen. Patient and will come back and see me again in 6 weeks for further evaluation and treatment. Continues respond well to osteopathic manipulation.

## 2015-09-06 NOTE — Assessment & Plan Note (Signed)
Decision today to treat with OMT was based on Physical Exam  After verbal consent patient was treated with HVLA, ME techniques in cervical, thoracic, lumbar and sacral areas  Patient tolerated the procedure well with improvement in symptoms  Patient given exercises, stretches and lifestyle modifications  See medications in patient instructions if given  Patient will follow up in 6 weeks      

## 2015-09-06 NOTE — Patient Instructions (Signed)
Good to see you.  Ice 20 minutes 2 times daily. Usually after activity and before bed. Good shoes with rigid bottom.  Dierdre HarnessKeen, Dansko, Merrell or New balance greater then 700 [employer] orthotics "total support" online would be great  [place] shoes could be great cross trainers for you.  See me again in 6 weeks.

## 2015-09-06 NOTE — Progress Notes (Signed)
[facility] [address] [place], [place] [address] Phone: [phone] Subjective:     CC: Back pain follow-up  BJY:[id number]:Subjective Melanie [name] is a 55 y.o. female coming in with complaint of .[name].   Neck pain Had some mild increasing tenderness recently. Started work out on a more regular basis. Patient is on a start working with a trainer in the near future. Still having some mild daily back ache. Patient and the fracture line go. Used to respond well to TENS.. Patient is looking into getting another one. Patient states that it is more just the stiffness. No radiation down the arm. Has responded osteopathic manipulation many times.  Patient is having some increasing in pain again. We did make her orthotics secondary to the deformity and her feet. 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 This is helping some of her back pain. Is wondering if she needs new orthotics.   Past Medical History  Diagnosis Date  . Allergy   . Asthma   . Osteoporosis    Past Surgical History  Procedure Laterality Date  . Appendectomy    . Abdominal hysterectomy    . Knee surgery     Social History  Substance Use Topics  . Smoking status: Never Smoker   . Smokeless tobacco: None  . Alcohol Use: Yes     Comment: 2/wk   Allergies  Allergen Reactions  . Latex   . Penicillins   . Sulfonamide Derivatives    Family History  Problem Relation Age of Onset  . Heart disease Father   . Irregular heart beat Mother   . Stroke Maternal Grandmother   . Heart attack Maternal Grandfather   . Stroke Paternal Grandmother   . Heart attack Paternal Grandfather   . Heart attack Brother     Past medical history, social, surgical and family history all reviewed in electronic medical record.   Review of Systems: No headache, visual changes, nausea, vomiting, diarrhea, constipation, dizziness, abdominal pain, skin rash, fevers, chills, night sweats, weight loss, swollen lymph nodes, body  aches, joint swelling, muscle aches, chest pain, shortness of breath, mood changes.   Objective Blood pressure 120/78, pulse 76, weight 154 lb (69.854 kg).  General: No apparent distress alert and oriented x3 mood and affect normal, dressed appropriately.  HEENT: Pupils equal, extraocular movements intact  Respiratory: Patient's speak in full sentences and does not appear short of breath Mild expiratory wheezes noted and patient is causing fairly regularly. Cardiovascular: No lower extremity edema, non tender, no erythema  Skin: Warm dry intact with no signs of infection or rash on extremities or on axial skeleton.  Abdomen: Soft nontender  Neuro: Cranial nerves II through XII are intact, neurovascularly intact in all extremities with 2+ DTRs and 2+ pulses.  Lymph: No lymphadenopathy of posterior or anterior cervical chain or axillae bilaterally.  Gait normal with good balance and coordination.  MSK:  Non tender with full range of motion and good stability and symmetric strength and tone of shoulders, elbows, wrist, knee,  hip, and ankles bilaterally.      Neck: Inspection unremarkable. No palpable stepoffs. Negative Spurling's maneuver. Patient is having increasing range of motion from previous exam. Grip strength and sensation normal in bilateral hands Strength good C4 to T1 distribution No sensory change to C4 to T1 Negative Hoffman sign bilaterally Reflexes normal Less tightness in the trapezius..  Back Exam:  Inspection: Unremarkable  Motion: Flexion 45 deg, Extension 45 deg, Side Bending  to 45 deg bilaterally,  Rotation to 45 deg bilaterally  SLR laying: Negative  XSLR laying: Negative  Palpable mild increase in soreness of the paraspinal lumbar area. FABER: negative today Sensory change: Gross sensation intact to all lumbar and sacral dermatomes.  Reflexes: 2+ at both patellar tendons, 2+ at achilles tendons, Babinski's downgoing.  Strength at foot is symmetric  patient does have the mild high instep. Mild hallux limitus bilaterally. Leg strength  Quad: 5/5 Hamstring: 5/5 Hip flexor: 5/5 Hip abductors: 4+/5  Gait unremarkable.  Osteopathic findings Cervical C2 flexed rotated and side bent left C4 flexed rotated and side bent right  Thoracic T3 extended rotated and side bent left inhale third rib T5 extended rotated and side bent right  Lumbar L3 flexed rotated and side bent right  Sacrum Left on left    Impression and Recommendations:     This case required medical decision making of moderate complexity.

## 2015-09-14 ENCOUNTER — Encounter: Payer: Self-pay | Admitting: Internal Medicine

## 2015-09-14 ENCOUNTER — Other Ambulatory Visit (INDEPENDENT_AMBULATORY_CARE_PROVIDER_SITE_OTHER): Payer: 59

## 2015-09-14 ENCOUNTER — Ambulatory Visit (INDEPENDENT_AMBULATORY_CARE_PROVIDER_SITE_OTHER): Payer: 59 | Admitting: Internal Medicine

## 2015-09-14 VITALS — BP 138/88 | HR 83 | Temp 98.3°F | Resp 16 | Ht 63.0 in | Wt 154.0 lb

## 2015-09-14 DIAGNOSIS — Z139 Encounter for screening, unspecified: Secondary | ICD-10-CM

## 2015-09-14 DIAGNOSIS — G43709 Chronic migraine without aura, not intractable, without status migrainosus: Secondary | ICD-10-CM

## 2015-09-14 DIAGNOSIS — J45901 Unspecified asthma with (acute) exacerbation: Secondary | ICD-10-CM | POA: Diagnosis not present

## 2015-09-14 DIAGNOSIS — Z1211 Encounter for screening for malignant neoplasm of colon: Secondary | ICD-10-CM

## 2015-09-14 DIAGNOSIS — Z0000 Encounter for general adult medical examination without abnormal findings: Secondary | ICD-10-CM

## 2015-09-14 LAB — CBC WITH DIFFERENTIAL/PLATELET
BASOS PCT: 0.4 % (ref 0.0–3.0)
Basophils Absolute: 0 10*3/uL (ref 0.0–0.1)
Eosinophils Absolute: 0 10*3/uL (ref 0.0–0.7)
Eosinophils Relative: 0.6 % (ref 0.0–5.0)
HCT: 36.8 % (ref 36.0–46.0)
HEMOGLOBIN: 12.6 g/dL (ref 12.0–15.0)
LYMPHS ABS: 1.4 10*3/uL (ref 0.7–4.0)
Lymphocytes Relative: 26.3 % (ref 12.0–46.0)
MCHC: 34.2 g/dL (ref 30.0–36.0)
MCV: 91.6 fl (ref 78.0–100.0)
MONO ABS: 0.6 10*3/uL (ref 0.1–1.0)
Monocytes Relative: 11.1 % (ref 3.0–12.0)
NEUTROS ABS: 3.3 10*3/uL (ref 1.4–7.7)
Neutrophils Relative %: 61.6 % (ref 43.0–77.0)
PLATELETS: 166 10*3/uL (ref 150.0–400.0)
RBC: 4.02 Mil/uL (ref 3.87–5.11)
RDW: 13.5 % (ref 11.5–15.5)
WBC: 5.3 10*3/uL (ref 4.0–10.5)

## 2015-09-14 LAB — COMPREHENSIVE METABOLIC PANEL
ALBUMIN: 4.3 g/dL (ref 3.5–5.2)
ALT: 35 U/L (ref 0–35)
AST: 22 U/L (ref 0–37)
Alkaline Phosphatase: 95 U/L (ref 39–117)
BUN: 15 mg/dL (ref 6–23)
CHLORIDE: 104 meq/L (ref 96–112)
CO2: 29 meq/L (ref 19–32)
Calcium: 9.6 mg/dL (ref 8.4–10.5)
Creatinine, Ser: 0.88 mg/dL (ref 0.40–1.20)
GFR: 86.41 mL/min (ref >60.00)
Glucose, Bld: 88 mg/dL (ref 70–99)
POTASSIUM: 3.9 meq/L (ref 3.5–5.1)
SODIUM: 139 meq/L (ref 135–145)
Total Bilirubin: 0.6 mg/dL (ref 0.2–1.2)
Total Protein: 8 g/dL (ref 6.0–8.3)

## 2015-09-14 LAB — LIPID PANEL
CHOL/HDL RATIO: 3
CHOLESTEROL: 225 mg/dL — AB (ref 0–200)
HDL: 73.6 mg/dL (ref >39.00)
LDL CALC: 141 mg/dL — AB (ref 0–99)
NONHDL: 151.8
TRIGLYCERIDES: 52 mg/dL (ref 0.0–149.0)
VLDL: 10.4 mg/dL (ref 0.0–40.0)

## 2015-09-14 LAB — TSH: TSH: 1.58 u[IU]/mL (ref 0.35–4.50)

## 2015-09-14 LAB — HEPATITIS C ANTIBODY: HCV AB: NEGATIVE

## 2015-09-14 LAB — HIV ANTIBODY (ROUTINE TESTING W REFLEX): HIV: NONREACTIVE

## 2015-09-14 NOTE — Progress Notes (Signed)
Pre visit review using our clinic review tool, if applicable. No additional management support is needed unless otherwise documented below in the visit note. 

## 2015-09-14 NOTE — Patient Instructions (Signed)
Test(s) ordered today. Your results will be released to Leslie (or called to you) after review, usually within 72hours after test completion. If any changes need to be made, you will be notified at that same time.  All other Health Maintenance issues reviewed.   All recommended immunizations and age-appropriate screenings are up-to-date or discussed.  No immunizations administered today.   Medications reviewed and updated.  No changes recommended at this time.   Please followup annually for a physical   Health Maintenance, Female Adopting a healthy lifestyle and getting preventive care can go a long way to promote health and wellness. Talk with your health care provider about what schedule of regular examinations is right for you. This is a good chance for you to check in with your provider about disease prevention and staying healthy. In between checkups, there are plenty of things you can do on your own. Experts have done a lot of research about which lifestyle changes and preventive measures are most likely to keep you healthy. Ask your health care provider for more information. WEIGHT AND DIET  Eat a healthy diet  Be sure to include plenty of vegetables, fruits, low-fat dairy products, and lean protein.  Do not eat a lot of foods high in solid fats, added sugars, or salt.  Get regular exercise. This is one of the most important things you can do for your health.  Most adults should exercise for at least 150 minutes each week. The exercise should increase your heart rate and make you sweat (moderate-intensity exercise).  Most adults should also do strengthening exercises at least twice a week. This is in addition to the moderate-intensity exercise.  Maintain a healthy weight  Body mass index (BMI) is a measurement that can be used to identify possible weight problems. It estimates body fat based on height and weight. Your health care provider can help determine your BMI and help  you achieve or maintain a healthy weight.  For females 24 years of age and older:   A BMI below 18.5 is considered underweight.  A BMI of 18.5 to 24.9 is normal.  A BMI of 25 to 29.9 is considered overweight.  A BMI of 30 and above is considered obese.  Watch levels of cholesterol and blood lipids  You should start having your blood tested for lipids and cholesterol at 53 years of age, then have this test every 5 years.  You may need to have your cholesterol levels checked more often if:  Your lipid or cholesterol levels are high.  You are older than 53 years of age.  You are at high risk for heart disease.  CANCER SCREENING   Lung Cancer  Lung cancer screening is recommended for adults 55-53 years old who are at high risk for lung cancer because of a history of smoking.  A yearly low-dose CT scan of the lungs is recommended for people who:  Currently smoke.  Have quit within the past 15 years.  Have at least a 30-pack-year history of smoking. A pack year is smoking an average of one pack of cigarettes a day for 1 year.  Yearly screening should continue until it has been 15 years since you quit.  Yearly screening should stop if you develop a health problem that would prevent you from having lung cancer treatment.  Breast Cancer  Practice breast self-awareness. This means understanding how your breasts normally appear and feel.  It also means doing regular breast self-exams. Let your health care  provider know about any changes, no matter how small.  If you are in your 20s or 30s, you should have a clinical breast exam (CBE) by a health care provider every 1-3 years as part of a regular health exam.  If you are 67 or older, have a CBE every year. Also consider having a breast X-ray (mammogram) every year.  If you have a family history of breast cancer, talk to your health care provider about genetic screening.  If you are at high risk for breast cancer, talk to  your health care provider about having an MRI and a mammogram every year.  Breast cancer gene (BRCA) assessment is recommended for women who have family members with BRCA-related cancers. BRCA-related cancers include:  Breast.  Ovarian.  Tubal.  Peritoneal cancers.  Results of the assessment will determine the need for genetic counseling and BRCA1 and BRCA2 testing. Cervical Cancer Your health care provider may recommend that you be screened regularly for cancer of the pelvic organs (ovaries, uterus, and vagina). This screening involves a pelvic examination, including checking for microscopic changes to the surface of your cervix (Pap test). You may be encouraged to have this screening done every 3 years, beginning at age 39.  For women ages 21-65, health care providers may recommend pelvic exams and Pap testing every 3 years, or they may recommend the Pap and pelvic exam, combined with testing for human papilloma virus (HPV), every 5 years. Some types of HPV increase your risk of cervical cancer. Testing for HPV may also be done on women of any age with unclear Pap test results.  Other health care providers may not recommend any screening for nonpregnant women who are considered low risk for pelvic cancer and who do not have symptoms. Ask your health care provider if a screening pelvic exam is right for you.  If you have had past treatment for cervical cancer or a condition that could lead to cancer, you need Pap tests and screening for cancer for at least 20 years after your treatment. If Pap tests have been discontinued, your risk factors (such as having a new sexual partner) need to be reassessed to determine if screening should resume. Some women have medical problems that increase the chance of getting cervical cancer. In these cases, your health care provider may recommend more frequent screening and Pap tests. Colorectal Cancer  This type of cancer can be detected and often  prevented.  Routine colorectal cancer screening usually begins at 53 years of age and continues through 53 years of age.  Your health care provider may recommend screening at an earlier age if you have risk factors for colon cancer.  Your health care provider may also recommend using home test kits to check for hidden blood in the stool.  A small camera at the end of a tube can be used to examine your colon directly (sigmoidoscopy or colonoscopy). This is done to check for the earliest forms of colorectal cancer.  Routine screening usually begins at age 64.  Direct examination of the colon should be repeated every 5-10 years through 53 years of age. However, you may need to be screened more often if early forms of precancerous polyps or small growths are found. Skin Cancer  Check your skin from head to toe regularly.  Tell your health care provider about any new moles or changes in moles, especially if there is a change in a mole's shape or color.  Also tell your health care provider  care provider if you have a mole that is larger than the size of a pencil eraser.  Always use sunscreen. Apply sunscreen liberally and repeatedly throughout the day.  Protect yourself by wearing long sleeves, pants, a wide-brimmed hat, and sunglasses whenever you are outside. HEART DISEASE, DIABETES, AND HIGH BLOOD PRESSURE   High blood pressure causes heart disease and increases the risk of stroke. High blood pressure is more likely to develop in:  People who have blood pressure in the high end of the normal range (130-139/85-89 mm Hg).  People who are overweight or obese.  People who are African American.  If you are 18-39 years of age, have your blood pressure checked every 3-5 years. If you are 40 years of age or older, have your blood pressure checked every year. You should have your blood pressure measured twice--once when you are at a hospital or clinic, and once when you are not at a hospital or clinic.  Record the average of the two measurements. To check your blood pressure when you are not at a hospital or clinic, you can use:  An automated blood pressure machine at a pharmacy.  A home blood pressure monitor.  If you are between 55 years and 79 years old, ask your health care provider if you should take aspirin to prevent strokes.  Have regular diabetes screenings. This involves taking a blood sample to check your fasting blood sugar level.  If you are at a normal weight and have a low risk for diabetes, have this test once every three years after 53 years of age.  If you are overweight and have a high risk for diabetes, consider being tested at a younger age or more often. PREVENTING INFECTION  Hepatitis B  If you have a higher risk for hepatitis B, you should be screened for this virus. You are considered at high risk for hepatitis B if:  You were born in a country where hepatitis B is common. Ask your health care provider which countries are considered high risk.  Your parents were born in a high-risk country, and you have not been immunized against hepatitis B (hepatitis B vaccine).  You have HIV or AIDS.  You use needles to inject street drugs.  You live with someone who has hepatitis B.  You have had sex with someone who has hepatitis B.  You get hemodialysis treatment.  You take certain medicines for conditions, including cancer, organ transplantation, and autoimmune conditions. Hepatitis C  Blood testing is recommended for:  Everyone born from 1945 through 1965.  Anyone with known risk factors for hepatitis C. Sexually transmitted infections (STIs)  You should be screened for sexually transmitted infections (STIs) including gonorrhea and chlamydia if:  You are sexually active and are younger than 53 years of age.  You are older than 53 years of age and your health care provider tells you that you are at risk for this type of infection.  Your sexual activity  has changed since you were last screened and you are at an increased risk for chlamydia or gonorrhea. Ask your health care provider if you are at risk.  If you do not have HIV, but are at risk, it may be recommended that you take a prescription medicine daily to prevent HIV infection. This is called pre-exposure prophylaxis (PrEP). You are considered at risk if:  You are sexually active and do not regularly use condoms or know the HIV status of your partner(s).  You take   injection.  You are sexually active with a partner who has HIV. Talk with your health care provider about whether you are at high risk of being infected with HIV. If you choose to begin PrEP, you should first be tested for HIV. You should then be tested every 3 months for as long as you are taking PrEP.  PREGNANCY   If you are premenopausal and you may become pregnant, ask your health care provider about preconception counseling.  If you may become pregnant, take 400 to 800 micrograms (mcg) of folic acid every day.  If you want to prevent pregnancy, talk to your health care provider about birth control (contraception). OSTEOPOROSIS AND MENOPAUSE   Osteoporosis is a disease in which the bones lose minerals and strength with aging. This can result in serious bone fractures. Your risk for osteoporosis can be identified using a bone density scan.  If you are 60 years of age or older, or if you are at risk for osteoporosis and fractures, ask your health care provider if you should be screened.  Ask your health care provider whether you should take a calcium or vitamin D supplement to lower your risk for osteoporosis.  Menopause may have certain physical symptoms and risks.  Hormone replacement therapy may reduce some of these symptoms and risks. Talk to your health care provider about whether hormone replacement therapy is right for you.  HOME CARE INSTRUCTIONS   Schedule regular health, dental, and eye  exams.  Stay current with your immunizations.   Do not use any tobacco products including cigarettes, chewing tobacco, or electronic cigarettes.  If you are pregnant, do not drink alcohol.  If you are breastfeeding, limit how much and how often you drink alcohol.  Limit alcohol intake to no more than 1 drink per day for nonpregnant women. One drink equals 12 ounces of beer, 5 ounces of wine, or 1 ounces of hard liquor.  Do not use street drugs.  Do not share needles.  Ask your health care provider for help if you need support or information about quitting drugs.  Tell your health care provider if you often feel depressed.  Tell your health care provider if you have ever been abused or do not feel safe at home.   This information is not intended to replace advice given to you by your health care provider. Make sure you discuss any questions you have with your health care provider.   Document Released: 12/09/2010 Document Revised: 06/16/2014 Document Reviewed: 04/27/2013 Elsevier Interactive Patient Education [employer].

## 2015-09-14 NOTE — Progress Notes (Signed)
Subjective:    Patient ID: Melanie [name], female    DOB: September 06, 1960, 55 y.o.   MRN: [id number]  HPI She is here to establish with a new pcp.  She is here for a physical exam.    She knows she needs to lose weight.  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 She walking regularly.  She also swims.   Her chronic medical conditions were reviewed.    She has no concerns.   Medications and allergies reviewed with patient and updated if appropriate.  Patient Active Problem List   Diagnosis Date Noted  . Polyarthralgia 08/23/2015  . Migraine 02/28/2015  . Quadriceps tendinitis 11/16/2014  . Neck pain 07/07/2014  . Concussion with no loss of consciousness 07/07/2014  . Nonallopathic lesion of lumbosacral region 11/04/2013  . Nonallopathic lesion of thoracic region 11/04/2013  . Piriformis syndrome of left side 10/14/2013  . Greater trochanteric bursitis of left hip 10/14/2013  . Trapezius muscle spasm 11/26/2012  . Nonallopathic lesion of cervicothoracic region 11/26/2012  . Seasonal allergies 09/28/2012  . Asthma attack 12/01/2011  . Backache 09/26/2009  . OTHER ACQUIRED DEFORMITY OF ANKLE AND FOOT OTHER 09/26/2009  . ABNORMALITY OF GAIT 09/26/2009  . KNEE PAIN, RIGHT 08/08/2009  . TENDINITIS, PATELLAR 08/08/2009    Current Outpatient Prescriptions on File Prior to Visit  Medication Sig Dispense Refill  . albuterol (PROVENTIL HFA;VENTOLIN HFA) 108 (90 Base) MCG/ACT inhaler Inhale 2 puffs into the lungs every 4 (four) hours as needed for wheezing or shortness of breath (cough, shortness of breath or wheezing.). 1 Inhaler 1  . azelastine (ASTELIN) 0.1 % nasal spray Place 1 spray into both nostrils 2 (two) times daily. 30 mL 12  . beclomethasone (QVAR) 40 MCG/ACT inhaler Inhale 1 puff into the lungs 2 (two) times daily. 1 Inhaler 2  . HYDROcodone-homatropine (HYCODAN) 5-1.5 MG/5ML syrup Take 5 mLs by mouth every 6 (six) hours  as needed for cough. 120 mL 0  . loratadine (CLARITIN) 10 MG tablet Take 10 mg by mouth daily.    . Multiple Vitamin (MULTIVITAMIN) tablet Take 1 tablet by mouth daily.    . rizatriptan (MAXALT-MLT) 10 MG disintegrating tablet Take 1 tablet (10 mg total) by mouth as needed for migraine. May repeat in 2 hours if needed 10 tablet 1  . traMADol (ULTRAM) 50 MG tablet Take 1 tablet (50 mg total) by mouth at bedtime as needed. 30 tablet 0   No current facility-administered medications on file prior to visit.    Past Medical History  Diagnosis Date  . Allergy   . Asthma   . Osteoporosis     Past Surgical History  Procedure Laterality Date  . Appendectomy    . Abdominal hysterectomy    . Knee surgery      Social History   Social History  . Marital Status: Divorced    Spouse Name: N/A  . Number of Children: N/A  . Years of Education: N/A   Social History Main Topics  . Smoking status: Never Smoker   . Smokeless tobacco: Never Used  . Alcohol Use: Yes     Comment: 2/wk  . Drug Use: No  . Sexual Activity: Not Asked   Other Topics Concern  . None   Social History Narrative   Exercises regularly    Family History  Problem Relation Age of Onset  . Heart disease Father 1  . Irregular heart  beat Mother   . Stroke Maternal Grandmother   . Heart attack Maternal Grandfather   . Stroke Paternal Grandmother   . Heart attack Paternal Grandfather   . Heart attack Brother 45    Review of Systems  Constitutional: Negative for fever, chills, appetite change and fatigue.       Hot flashes  HENT: Negative for hearing loss.   Eyes: Negative for visual disturbance.  Respiratory: Negative for cough, shortness of breath and wheezing.   Cardiovascular: Negative for chest pain, palpitations and leg swelling.  Gastrointestinal: Negative for nausea, abdominal pain, diarrhea, constipation and blood in stool.       No gerd  Genitourinary: Negative for dysuria and hematuria.    Musculoskeletal: Positive for back pain and arthralgias (right knee). Negative for myalgias.  Skin: Negative for color change and rash.  Neurological: Negative for dizziness, light-headedness and headaches.  Psychiatric/Behavioral: Negative for sleep disturbance and dysphoric mood. The patient is not nervous/anxious.        Objective:   Filed Vitals:   09/14/15 0846  BP: 138/88  Pulse: 83  Temp: 98.3 F (36.8 C)  Resp: 16   Filed Weights   09/14/15 0846  Weight: 154 lb (69.854 kg)   Body mass index is 27.29 kg/(m^2).   Physical Exam Constitutional: She appears well-developed and well-nourished. No distress.  HENT:  Head: Normocephalic and atraumatic.  Right Ear: External ear normal. Normal ear canal and TM Left Ear: External ear normal.  Normal ear canal and TM Mouth/Throat: Oropharynx is clear and moist.  Eyes: Conjunctivae and EOM are normal.  Neck: Neck supple. No tracheal deviation present. No thyromegaly present.  No carotid bruit  Cardiovascular: Normal rate, regular rhythm and normal heart sounds.   No murmur heard.  No edema. Pulmonary/Chest: Effort normal and breath sounds normal. No respiratory distress. She has no wheezes. She has no rales.  Breast: deferred to Gyn Abdominal: Soft. She exhibits no distension. There is no tenderness.  Lymphadenopathy: She has no cervical adenopathy.  Skin: Skin is warm and dry. She is not diaphoretic.  Psychiatric: She has a normal mood and affect. Her behavior is normal.           Assessment & Plan:   Physical exam: Screening blood work ordered Immunizations  Up to date  Colonoscopy  Never had one -- referred today  Mammogram  Will schedule Gyn - will schedule Dexa - just into menopause - will hold off Eye exams - will schedule EKG last done in 2014 Exercise - exercising regularly Weight - discussed weight loss and goal goal of around 135 Skin no concerns Substance abuse -- none   See Problem List for  Assessment and Plan of chronic medical problems.  F/u annually for a physical exam

## 2015-09-14 NOTE — Assessment & Plan Note (Signed)
Uses maxalt as needed - last used in a long time ago Improved with menopause

## 2015-09-16 ENCOUNTER — Encounter: Payer: Self-pay | Admitting: Internal Medicine

## 2015-09-18 MED FILL — QVAR 40 MCG ORAL INHALER: 40 | 30 days supply | Qty: 9 | Fill #0

## 2015-10-18 ENCOUNTER — Ambulatory Visit (INDEPENDENT_AMBULATORY_CARE_PROVIDER_SITE_OTHER): Payer: 59 | Admitting: Family Medicine

## 2015-10-18 ENCOUNTER — Encounter: Payer: Self-pay | Admitting: Family Medicine

## 2015-10-18 VITALS — BP 112/74 | HR 89 | Ht 63.0 in | Wt 156.0 lb

## 2015-10-18 DIAGNOSIS — M9903 Segmental and somatic dysfunction of lumbar region: Secondary | ICD-10-CM | POA: Diagnosis not present

## 2015-10-18 DIAGNOSIS — M9902 Segmental and somatic dysfunction of thoracic region: Secondary | ICD-10-CM | POA: Diagnosis not present

## 2015-10-18 DIAGNOSIS — M542 Cervicalgia: Secondary | ICD-10-CM | POA: Diagnosis not present

## 2015-10-18 DIAGNOSIS — M9901 Segmental and somatic dysfunction of cervical region: Secondary | ICD-10-CM

## 2015-10-18 DIAGNOSIS — M999 Biomechanical lesion, unspecified: Secondary | ICD-10-CM

## 2015-10-18 NOTE — Progress Notes (Signed)
Tawana ScaleZach Smith D.O. [facility] [address] [place], [place] [address] Phone: [phone] Subjective:     CC: Back pain follow-up  BJY:[id number]:Subjective Melanie [name] is a 55 y.o. female coming in with complaint of .[name].   Neck pain Patient states that since she's been changing her position at work as well as his doing lighter weights and keeping her hands within peripheral vision she is no longer having significant pain in the neck. Some mild discomfort from time to time but nothing that seems to be severe.  Reason cramping in the calf. States that it only happened at night one time. Patient denies that happen again. Has been doing a lot more stairs recently. Denies affect of her daily activities. Patient states though is that she's been doing the stairs she's been feeling better overall.   Past Medical History  Diagnosis Date  . Allergy   . Asthma   . Osteoporosis    Past Surgical History  Procedure Laterality Date  . Appendectomy    . Abdominal hysterectomy    . Knee surgery     Social History  Substance Use Topics  . Smoking status: Never Smoker   . Smokeless tobacco: Never Used  . Alcohol Use: Yes     Comment: 2/wk   Allergies  Allergen Reactions  . Latex   . Penicillins   . Sulfonamide Derivatives    Family History  Problem Relation Age of Onset  . Heart disease Father [id number]  . Irregular heart beat Mother   . Stroke Maternal Grandmother   . Heart attack Maternal Grandfather   . Stroke Paternal Grandmother   . Heart attack Paternal Grandfather   . Heart attack Brother 45    Past medical history, social, surgical and family history all reviewed in electronic medical record.   Review of Systems: No headache, visual changes, nausea, vomiting, diarrhea, constipation, dizziness, abdominal pain, skin rash, fevers, chills, night sweats, weight loss, swollen lymph nodes, body aches, joint swelling, muscle aches, chest pain, shortness of breath, mood changes.    Objective Blood pressure 112/74, pulse 89, height 5\' 3"  (1.6 m), weight 156 lb (70.761 kg), SpO2 97 %.  General: No apparent distress alert and oriented x3 mood and affect normal, dressed appropriately.  HEENT: Pupils equal, extraocular movements intact  Respiratory: Patient's speak in full sentences and does not appear short of breath Mild expiratory wheezes noted and patient is causing fairly regularly. Cardiovascular: No lower extremity edema, non tender, no erythema  Skin: Warm dry intact with no signs of infection or rash on extremities or on axial skeleton.  Abdomen: Soft nontender  Neuro: Cranial nerves II through XII are intact, neurovascularly intact in all extremities with 2+ DTRs and 2+ pulses.  Lymph: No lymphadenopathy of posterior or anterior cervical chain or axillae bilaterally.  Gait normal with good balance and coordination.  MSK:  Non tender with full range of motion and good stability and symmetric strength and tone of shoulders, elbows, wrist, knee,  hip, and ankles bilaterally.      Neck: Inspection unremarkable. No palpable stepoffs. Negative Spurling's maneuver. Patient is having increasing range of motion from previous exam. Grip strength and sensation normal in bilateral hands Strength good C4 to T1 distribution No sensory change to C4 to T1 Negative Hoffman sign bilaterally Reflexes normal Less tightness in the trapezius..  Back Exam:  Inspection: Unremarkable  Motion: Flexion 45 deg, Extension 45 deg, Side Bending to 45 deg bilaterally,  Rotation to 45  deg bilaterally  SLR laying: Negative  XSLR laying: Negative  Nontender on exam today FABER: negative today Sensory change: Gross sensation intact to all lumbar and sacral dermatomes.  Reflexes: 2+ at both patellar tendons, 2+ at achilles tendons, Babinski's downgoing.  Strength the legs normal. Leg strength  Quad: 5/5 Hamstring: 5/5 Hip flexor: 5/5 Hip abductors: 4+/5  Gait  unremarkable.  Osteopathic findings Cervical C2 flexed rotated and side bent left C4 flexed rotated and side bent right  Thoracic T3 extended rotated and side bent left inhale third rib T5 extended rotated and side bent right  Lumbar L3 flexed rotated and side bent right  Sacrum Left on left No change in pattern today.   Impression and Recommendations:     This case required medical decision making of moderate complexity.

## 2015-10-18 NOTE — Assessment & Plan Note (Signed)
Overall patient is making improvement. We discussed icing regimen and home exercise. We discussed which activities to do an which was to avoid. Patient will come back and see me again in 6-8 weeks.

## 2015-10-18 NOTE — Patient Instructions (Signed)
Good to see you  I am impressed.  Stay active and keep doing the stairs.  We will hold on checking the iron for now I would consider ferrous gluconate at least 2 times a week with 500mg  of vitamin C  See me again in 6--8 weeks.

## 2015-10-18 NOTE — Assessment & Plan Note (Signed)
Decision today to treat with OMT was based on Physical Exam  After verbal consent patient was treated with HVLA, ME techniques in cervical, thoracic, lumbar and sacral areas  Patient tolerated the procedure well with improvement in symptoms  Patient given exercises, stretches and lifestyle modifications  See medications in patient instructions if given  Patient will follow up in 6-8 weeks                   

## 2015-10-18 NOTE — Progress Notes (Signed)
Pre visit review using our clinic review tool, if applicable. No additional management support is needed unless otherwise documented below in the visit note. 

## 2015-10-23 ENCOUNTER — Encounter: Payer: 59 | Attending: Internal Medicine | Admitting: Dietician

## 2015-10-23 ENCOUNTER — Encounter: Payer: Self-pay | Admitting: Dietician

## 2015-10-23 VITALS — Ht 62.0 in | Wt 154.0 lb

## 2015-10-23 DIAGNOSIS — Z713 Dietary counseling and surveillance: Secondary | ICD-10-CM | POA: Insufficient documentation

## 2015-10-23 DIAGNOSIS — D509 Iron deficiency anemia, unspecified: Secondary | ICD-10-CM

## 2015-10-23 NOTE — Progress Notes (Addendum)
  Medical Nutrition Therapy:  Appt start time: 1045 end time:  1145.   Assessment:  Primary concerns today: Patient is here today alone.  She would like to learn about nutrition to improve her anemia, lose weight and control her cholesterol.  Labs include:  hemoglobin 12.6, Cholesterol 225, Triglycerides 52, HDL 73, LDL 141 09/14/15.  She overall has a very healthy diet but it is low in iron at times. She recently attended Jeannie's class on mindful weight.  Weight hx: Lowest adult weight UBW 135 lbs until age 55 (menapause) Today's weight 154 lbs.  Patient lives alone.  She works for [employer].   Preferred Learning Style:   No preference indicated   Learning Readiness:   Ready  Change in progress   MEDICATIONS: see list  including vitamin D, vitamin D, iron   DIETARY INTAKE:  Usual eating pattern includes 3 meals and 2 snacks per day.  Everyday foods include lean meats, vegetables, fruit, and other unprocessed foods.  Avoided foods include peanuts and kiwi due to allergy, milk due to lactose intolerance, MSG due to migraines.  Sugar and caffeine due to these increasing hot flashes.  24-hr recall:  B ( AM): steel cut oatmeal with raisins and nuts, unsweetened, green tea  Snk ( AM): apple or celery and hummus  L ( PM): lean protein, salad, fruit  Snk ( PM): plain greek yogurt with honey or fruit D ( PM): salmon, asparagus, brown rice Snk ( PM): none or sun butter or almond butter on fruit or celery if very hungry Beverages: water, unsweetened green tea  Usual physical activity: works out 3-5 times per week.  Cardio each time and strength training every other time.  Take the stairs at work just for exercise.  30 minutes- 1 hour.  She also has a standing work station.  She has had a back injury and knee injury that have made exercise difficult at times.  Pool exercises at times.    Estimated energy needs: 1500 calories 60 grams protein  Progress Towards  Goal(s):  In progress.   Nutritional Diagnosis:  NB-1.1 Food and nutrition-related knowledge deficit As related to food source for iron.  As evidenced by diet hx and patient report.    Intervention:  Nutrition counseling/education related to iron deficiency anemia.  We also discussed recommendations to improve cholesterol levels.  Patient follows these guidelines.  Discussed her desire to lose weight.  She states that she has lost 10 lbs and is unable to lose more but that her body composition is changing.  Discussed the impact of exercise to increase muscle mass and metabolism.  Discussed importance of continued healthy diet without over restricting.  Discussed the effects of inflammation on the body.  See the list of iron rich foods.  Include this with vitamin C rich foods. Continue to stay active. Continue your healthy eating habits.  Teaching Method Utilized:  Visual Auditory Hands on  Handouts given during visit include:  Sample meal plans from Choose my WhoisBlogging.chplate.gov  Iron deficiency anemia nutrition therapy from AND  Barriers to learning/adherence to lifestyle change: none  Demonstrated degree of understanding via:  Teach Back   Monitoring/Evaluation:  Dietary intake, exercise, and body weight prn.

## 2015-10-23 NOTE — Patient Instructions (Signed)
See the list of iron rich foods.  Include this with vitamin C rich foods. Continue to stay active. Continue your healthy eating habits.

## 2015-11-19 ENCOUNTER — Encounter: Payer: Self-pay | Admitting: Family Medicine

## 2015-11-19 ENCOUNTER — Ambulatory Visit (INDEPENDENT_AMBULATORY_CARE_PROVIDER_SITE_OTHER): Payer: 59 | Admitting: Family Medicine

## 2015-11-19 VITALS — Ht 62.0 in | Wt 154.6 lb

## 2015-11-19 DIAGNOSIS — E663 Overweight: Secondary | ICD-10-CM | POA: Diagnosis not present

## 2015-11-19 DIAGNOSIS — E7800 Pure hypercholesterolemia, unspecified: Secondary | ICD-10-CM | POA: Diagnosis not present

## 2015-11-19 NOTE — Patient Instructions (Addendum)
-   To help lower LDL:  Minimize saturated fats like those from animal products (cheese, red meat, butter).    - The best fats to help lower cholesterol are mono-unsaturated and polyunsaturated fats.   - Mono-unsaturated includes olive (XV is best); avocado, canola oil.    - Polyunsaturated: Nuts, seeds, fish (not fried).  (Not necessary to use these oils when EVOO is preferred.)  - It's best to get most of your fat from the mono-fats.     - Soluble fiber can also help lower LDL:  Fruits, veg's, and beans like pintos, kidney, garbanzos.    - Aim for 3-4 X wk.  If beans are your protein source for that meal, consider 1 full cup a portion.    - Obtain twice as many veg's as protein or carbohydrate foods for both lunch and dinner.  - Keep frozen veg's (store-bought or home-frozen) on hand for a quick veg serving.     (No need to measure your non-starchy veg's - except to ensure you are getting enough.)  Losing some weight will also help to lower your LDL:  - For best blood sugar and weight management, you might do best with limiting starchy food to TWO portions per meal.     - ONE slice of bread (or its equivalent, such as half of a hamburger bun).   - 1/2 cup of a "scoopable" starchy food such as potatoes or rice.   - 15 grams of carbohydrate as shown on food label.   - Email Jeannie for follow-up appt starting July 18.

## 2015-11-19 NOTE — Progress Notes (Signed)
Medical Nutrition Therapy:  Appt start time: 1330 end time:  1430.  Assessment:  Primary concerns today: Weight management and iron-deficiency anemia.   Melanie [name] would like to improve her LDL (141 on 09-14-15).  She has an HDL of 73 and TC of 225.    Learning Readiness: Change in progress  Usual eating pattern includes 3 meals and 1-2 snacks per day. Frequent foods and beverages include Lean meats, veg's, fruit.  Avoided foods include peanuts, kiwi (allergy), milk (lactose intol), MSG (migraines H/As), and sugar and caffeine (hot flashes).   Usual physical activity includes 20-30 min walking 5-7 X wk and gym after work 3-5 X week: ~30 min cardio each time and strength training every other time.  Takes the stairs at work.  She has a standing work station, which she alternates with sitting.  Previous back and knee injuries make exercise difficult at times.  Does water exercise about once a month.    24-hr recall: (Up at 8 AM) B (8:30 AM)-   1/3 c steel-cut oats, 1/2 alm milk, 1/2 c blueberries, water, 1 c green tea Snk ( AM)-   --- L (3 PM)-  4 oz pork loin, 1/2 c brn rice, 1 c broccoli, 1/2 str'ber's, water Snk ( PM)-  --- D (8 PM)-  1 salad, 4 oz chx, 2 T drsng, 1 t sunflwr seeds, water Snk ( PM)-  --- Typical day? No.Usually has 2 snacks a day, and eats earlier than yesterday's lunch and dinner.  (Too busy yesterday.)  Progress Towards Goal(s):  In progress.    Nutritional Diagnosis:  Carrizo-3.3 Overweight/obesity As related to energy imbalance.  As evidenced by BMI ~28.    Intervention:  Discussed ways in which to incorporate more vegetables and beans as sources of soluble fiber especially.  Did not recommend increasing exercise, as Melanie [name] is quite active now, and has limitations due to back and knee pain (as well as allergies for outdoor exercise) sometimes.  Also reviewed high-quality fats.   Handouts given during visit include:  AVS  Demonstrated degree of understanding via:  Teach Back   Barriers to learning/adherence to lifestyle change: None.   Monitoring/Evaluation:  Dietary intake, exercise, and body weight in 5 week(s).

## 2015-11-29 ENCOUNTER — Ambulatory Visit: Payer: 59 | Admitting: Family Medicine

## 2015-12-06 ENCOUNTER — Encounter: Payer: Self-pay | Admitting: Family Medicine

## 2015-12-06 ENCOUNTER — Ambulatory Visit (INDEPENDENT_AMBULATORY_CARE_PROVIDER_SITE_OTHER): Payer: 59 | Admitting: Family Medicine

## 2015-12-06 ENCOUNTER — Other Ambulatory Visit: Payer: Self-pay

## 2015-12-06 VITALS — BP 100/78 | HR 90 | Ht 63.0 in | Wt 156.0 lb

## 2015-12-06 DIAGNOSIS — M9902 Segmental and somatic dysfunction of thoracic region: Secondary | ICD-10-CM | POA: Diagnosis not present

## 2015-12-06 DIAGNOSIS — M25552 Pain in left hip: Secondary | ICD-10-CM

## 2015-12-06 DIAGNOSIS — M9901 Segmental and somatic dysfunction of cervical region: Secondary | ICD-10-CM

## 2015-12-06 DIAGNOSIS — M542 Cervicalgia: Secondary | ICD-10-CM

## 2015-12-06 DIAGNOSIS — M7062 Trochanteric bursitis, left hip: Secondary | ICD-10-CM

## 2015-12-06 DIAGNOSIS — M9903 Segmental and somatic dysfunction of lumbar region: Secondary | ICD-10-CM

## 2015-12-06 DIAGNOSIS — M999 Biomechanical lesion, unspecified: Secondary | ICD-10-CM

## 2015-12-06 NOTE — Progress Notes (Signed)
Melanie [name] D.O. [facility] [address] [place], [place] [address] Phone: [phone]) [phone] Subjective:     CC: Back pain follow-up  QMV:[id number]:Subjective Melanie D Pernell Dupredams is a 55 y.o. female coming in with complaint of    Neck pain .  Seems to be stable overall. Patient states that she seems to do well as long as she continues to change positions at work going from a seated to standing desk intermittently. Patient states that she's been doing some the exercises but not as frequent and thinks it is seems to be helpful. Always has been responding to osteopathic manipulation.  No longer having cramping in the calf. Feeling better.  Complaining of left hip pain. History of greater trochanteric bursitis. Responded to an injection back in August 2015. Started to hurt her again over the course last 6 weeks. Starting to wake her up at night now. Not affecting daily activities yet. Has not wanted to get as severe as it did back pain.   Past Medical History  Diagnosis Date  . Allergy   . Asthma   . Osteoporosis    Past Surgical History  Procedure Laterality Date  . Appendectomy    . Abdominal hysterectomy    . Knee surgery     Social History  Substance Use Topics  . Smoking status: Never Smoker   . Smokeless tobacco: Never Used  . Alcohol Use: Yes     Comment: 2/wk   Allergies  Allergen Reactions  . Kiwi Extract Hives, Shortness Of Breath and Itching  . Latex   . Penicillins   . Sulfonamide Derivatives    Family History  Problem Relation Age of Onset  . Heart disease Father 2439  . Irregular heart beat Mother   . Stroke Maternal Grandmother   . Heart attack Maternal Grandfather   . Stroke Paternal Grandmother   . Heart attack Paternal Grandfather   . Heart attack Brother 45    Past medical history, social, surgical and family history all reviewed in electronic medical record.   Review of Systems: No headache, visual changes, nausea, vomiting, diarrhea, constipation,  dizziness, abdominal pain, skin rash, fevers, chills, night sweats, weight loss, swollen lymph nodes, body aches, joint swelling, muscle aches, chest pain, shortness of breath, mood changes.   Objective Blood pressure 100/78, pulse 90, height 5\' 3"  (1.6 m), weight 156 lb (70.761 kg), SpO2 96 %.  General: No apparent distress alert and oriented x3 mood and affect normal, dressed appropriately.  HEENT: Pupils equal, extraocular movements intact  Respiratory: Patient's speak in full sentences and does not appear short of breath Mild expiratory wheezes noted and patient is causing fairly regularly. Cardiovascular: No lower extremity edema, non tender, no erythema  Skin: Warm dry intact with no signs of infection or rash on extremities or on axial skeleton.  Abdomen: Soft nontender  Neuro: Cranial nerves II through XII are intact, neurovascularly intact in all extremities with 2+ DTRs and 2+ pulses.  Lymph: No lymphadenopathy of posterior or anterior cervical chain or axillae bilaterally.  Gait normal with good balance and coordination.  MSK:  Non tender with full range of motion and good stability and symmetric strength and tone of shoulders, elbows, wrist, knee,  hip, and ankles bilaterally.      Neck: Inspection unremarkable. No palpable stepoffs. Negative Spurling's maneuver. Patient is having increasing range of motion from previous exam. Grip strength and sensation normal in bilateral hands Strength good C4 to T1 distribution No sensory change to  C4 to T1 Negative Hoffman sign bilaterally Reflexes normal Less tightness in the trapezius..  Back Exam:  Inspection: Unremarkable  Motion: Flexion 45 deg, Extension 25 deg, Side Bending to 45 deg bilaterally,  Rotation to 45 deg bilaterally  SLR laying: Negative  XSLR laying: Negative  Tender to palpation over the left greater trochanteric area. Much more severe than any exam recently. FABER: positive Pearlean BrownieFaber on left which is  new. Sensory change: Gross sensation intact to all lumbar and sacral dermatomes.  Reflexes: 2+ at both patellar tendons, 2+ at achilles tendons, Babinski's downgoing.  Strength the legs normal. Leg strength  Quad: 5/5 Hamstring: 5/5 Hip flexor: 5/5 Hip abductors: 4+/5  Gait unremarkable.  Osteopathic findings Cervical C2 flexed rotated and side bent left C7 flexed rotated and side bent right  Thoracic T3 extended rotated and side bent left inhale third rib T7 extended rotated and side bent right  Lumbar L2 flexed rotated and side bent right  Sacrum Left on left  MSK US performed of: left This study was ordered, performed, and interpreted by Terrilee FilesZach Karmina Zufall D.O.  Hip: Trochanteric bursa with significant hypoechoic changes and swelling Acetabular labrum visualized and without tears, displacement, or effusion in joint. Femoral neck appears unremarkable without increased power doppler signal along Cortex.  IMPRESSION:  Greater trochanter bursitis   Procedure: Real-time Ultrasound Guided Injection of leftgreater trochanteric bursitis secondary to patient's body habitus Device: GE Logiq E  Ultrasound guided injection is preferred based studies that show increased duration, increased effect, greater accuracy, decreased procedural pain, increased response rate, and decreased cost with ultrasound guided versus blind injection.  Verbal informed consent obtained.  Time-out conducted.  Noted no overlying erythema, induration, or other signs of local infection.  Skin prepped in a sterile fashion.  Local anesthesia: Topical Ethyl chloride.  With sterile technique and under real time ultrasound guidance:  Greater trochanteric area was visualized and patient's bursa was noted. A 22-gauge 3 inch needle was inserted and 4 cc of 0.5% Marcaine and 1 cc of Kenalog 40 mg/dL was injected. Pictures taken Completed without difficulty  Pain immediately resolved suggesting accurate placement of the  medication.  Advised to call if fevers/chills, erythema, induration, drainage, or persistent bleeding.  Images permanently stored and available for review in the ultrasound unit.  Impression: Technically successful ultrasound guided injection.     Impression and Recommendations:     This case required medical decision making of moderate complexity.

## 2015-12-06 NOTE — Patient Instructions (Addendum)
Great to see you  Ice to the hip up to 3 times a day  Exercises on wall.  Heel and butt touching.  Raise leg 6 inches and hold 2 seconds.  Down slow for count of 4 seconds.  1 set of 30 reps daily on both sides.  Stretch the hip flexor as well.  Stay active Pennsaid topically before bed to help with the pain  The back was awesome.  Do not change a thing See me again in 6-8 weeks

## 2015-12-06 NOTE — Progress Notes (Signed)
Pre visit review using our clinic review tool, if applicable. No additional management support is needed unless otherwise documented below in the visit note. 

## 2015-12-06 NOTE — Assessment & Plan Note (Signed)
Patient given injection again today. Patient does have a history of back pain with multiple injections necessary and this is within the differential. Patient had near complete resolution of pain after the injection. Encourage her to do more home exercises focusing on the hip abductor strengthening. We discussed which activities to potentially avoid. Discussed the importance of shoe wear. Patient come back and see me again in 6-8 weeks for further evaluation and treatment.

## 2015-12-06 NOTE — Assessment & Plan Note (Signed)
Decision today to treat with OMT was based on Physical Exam  After verbal consent patient was treated with HVLA, ME techniques in cervical, thoracic, lumbar and sacral areas  Patient tolerated the procedure well with improvement in symptoms  Patient given exercises, stretches and lifestyle modifications  See medications in patient instructions if given  Patient will follow up in 6-8 weeks                   

## 2015-12-06 NOTE — Assessment & Plan Note (Signed)
Seems to be stable at this time. We discussed icing regimen and home exercises. We discussed which activities doing which ones to avoid. Patient will come back and see me again in 6-8 weeks for further evaluation and treatment. Discussed the importance of ergonomics at work.

## 2015-12-25 ENCOUNTER — Ambulatory Visit: Payer: 59 | Admitting: Family Medicine

## 2016-01-14 ENCOUNTER — Ambulatory Visit (INDEPENDENT_AMBULATORY_CARE_PROVIDER_SITE_OTHER): Payer: 59 | Admitting: Family Medicine

## 2016-01-14 ENCOUNTER — Encounter: Payer: Self-pay | Admitting: Family Medicine

## 2016-01-14 DIAGNOSIS — E663 Overweight: Secondary | ICD-10-CM

## 2016-01-14 NOTE — Progress Notes (Signed)
Medical Nutrition Therapy:  Appt start time: 1330 end time:  1430.  Assessment:  Primary concerns today: Weight management and iron-deficiency anemia.   [name] has had some back/hip problems that has limited her exercise.  Got an injection a couple weeks ago, which has allowed her to get back to yoga.  She does yoga 5-6 X wk.  Also walking 15-30 min 4-5 X wk.  [name] wears a Gar[employer]; usually gets >10,000 steps a day.    [name] said she feels better since she's been eating better.  She has been diligent about limiting carbohydrate, possibly overly restrictive.  Although she feels her clothes fit better, she is frustrated that she's not been able to lose weight.    Learning Readiness: Change in progress  24-hr recall:  (Up at 8 AM) B (8:30 AM)-  1/3 c dry steel-cut oats, 6 oz alm milk, 1 banana, 1 T walnuts, green decaf tea Snk ( AM)-  --- L (3 PM)-  1 skinless chx thigh, 1 c broccoli, 1/2 c brn rice, water Snk ( PM)-  --- D (6 PM)-  1 large salad, 1 c blk beans, 1 T olive oil, vinegar, water Snk ( PM)-  --- Typical day? Yes.  for a Sunday.  On weekdays, [name] usually gets one to two snacks, and she has a big salad for lunch, with a lean protein, or sometimes she has a sandwich and fruit.    Progress Towards Goal(s):  In progress.    Nutritional Diagnosis:  Good progress on food choices, but no change in weight:  March ARB-3.3 Overweight/obesity As related to energy imbalance.  As evidenced by BMI ~28.    Intervention:  Nutrition education.  Handouts given during visit include:  AVS  Demonstrated degree of understanding via:  Teach Back  Barriers to learning/adherence to lifestyle change: None.   Monitoring/Evaluation:  Dietary intake, exercise, and body weight prn.  Patient will email progress report this fall.

## 2016-01-14 NOTE — Patient Instructions (Addendum)
-   Although you are limiting carb's, you don't want to go too low on carb intake (or on calorie intake).    - Make sure you ARE getting those two carb portions per meal.    - The "best" carb will have some fiber, and will be satisfying to you.    - Keep some whole-grain crackers at work along with your other snacks.    - Spiralized veg's: A way to get some carb in your sauce is to simmer lentils in a marinara sauce.   - Emphasis on veg's: Even when you have a sandwich for lunch, also pack veg's (salad, soup, raw veg's).   - Almond milk: Look for the protein-fortified.    - Continue to weigh yourself weekly.    - Check in with Melanie [name] via email no later than late-September.

## 2016-01-17 ENCOUNTER — Ambulatory Visit (INDEPENDENT_AMBULATORY_CARE_PROVIDER_SITE_OTHER): Payer: 59 | Admitting: Family Medicine

## 2016-01-17 ENCOUNTER — Encounter: Payer: Self-pay | Admitting: Family Medicine

## 2016-01-17 VITALS — BP 122/80 | HR 91 | Wt 157.0 lb

## 2016-01-17 DIAGNOSIS — M999 Biomechanical lesion, unspecified: Secondary | ICD-10-CM

## 2016-01-17 DIAGNOSIS — M9902 Segmental and somatic dysfunction of thoracic region: Secondary | ICD-10-CM

## 2016-01-17 DIAGNOSIS — M9901 Segmental and somatic dysfunction of cervical region: Secondary | ICD-10-CM | POA: Diagnosis not present

## 2016-01-17 DIAGNOSIS — M7062 Trochanteric bursitis, left hip: Secondary | ICD-10-CM

## 2016-01-17 DIAGNOSIS — M542 Cervicalgia: Secondary | ICD-10-CM

## 2016-01-17 DIAGNOSIS — M9903 Segmental and somatic dysfunction of lumbar region: Secondary | ICD-10-CM | POA: Diagnosis not present

## 2016-01-17 NOTE — Assessment & Plan Note (Signed)
Mild increasing tightness as time. We discussed icing regimen, home exercises, posture and ergonomics. We discussed that she is going to do certain activities doing to avoid. Patient will come back and see me again in 3-4 weeks secondary to the increasing tightness recently.

## 2016-01-17 NOTE — Progress Notes (Signed)
Tawana ScaleZach Smith D.O.  [facility] [address] [place], [place] [address] Phone: [phone]([phone] Subjective:     CC: Back pain follow-up  BJY:[id number]:Subjective  Melanie [name] is a 55 y.o. female coming in with complaint of    Neck pain .  Mild increasing tenderness and tightness recently. Patient states that it has been a little more sore recently. Nothing new. No new activities except yoga. Denies any radiation down the arm or any numbness or tingling.  Complaining of left hip pain. History of greater trochanteric bursitis. Had injection at last follow-up. Feels like she is doing well but does have tightness in the left back. Not as much tenderness in the left hip.   Past Medical History:  Diagnosis Date  . Allergy   . Asthma   . Osteoporosis    Past Surgical History:  Procedure Laterality Date  . ABDOMINAL HYSTERECTOMY    . APPENDECTOMY    . KNEE SURGERY     Social History  Substance Use Topics  . Smoking status: Never Smoker  . Smokeless tobacco: Never Used  . Alcohol use Yes     Comment: 2/wk   Allergies  Allergen Reactions  . Kiwi Extract Hives, Shortness Of Breath and Itching  . Latex   . Penicillins   . Sulfonamide Derivatives    Family History  Problem Relation Age of Onset  . Heart disease Father 6439  . Irregular heart beat Mother   . Stroke Maternal Grandmother   . Heart attack Maternal Grandfather   . Stroke Paternal Grandmother   . Heart attack Paternal Grandfather   . Heart attack Brother 45    Past medical history, social, surgical and family history all reviewed in electronic medical record.   Review of Systems: No headache, visual changes, nausea, vomiting, diarrhea, constipation, dizziness, abdominal pain, skin rash, fevers, chills, night sweats, weight loss, swollen lymph nodes, body aches, joint swelling, muscle aches, chest pain, shortness of breath, mood changes.   Objective  Blood pressure 122/80, pulse 91, weight 157 lb (71.2 kg), SpO2  95 %.  General: No apparent distress alert and oriented x3 mood and affect normal, dressed appropriately.  HEENT: Pupils equal, extraocular movements intact  Respiratory: Patient's speak in full sentences and does not appear short of breath Mild expiratory wheezes noted and patient is causing fairly regularly. Cardiovascular: No lower extremity edema, non tender, no erythema  Skin: Warm dry intact with no signs of infection or rash on extremities or on axial skeleton.  Abdomen: Soft nontender  Neuro: Cranial nerves II through XII are intact, neurovascularly intact in all extremities with 2+ DTRs and 2+ pulses.  Lymph: No lymphadenopathy of posterior or anterior cervical chain or axillae bilaterally.  Gait normal with good balance and coordination.  MSK:  Non tender with full range of motion and good stability and symmetric strength and tone of shoulders, elbows, wrist, knee,  hip, and ankles bilaterally.      Neck: Inspection unremarkable. No palpable stepoffs. Negative Spurling's maneuver. Patient is having increasing range of motion from previous exam. Grip strength and sensation normal in bilateral hands Strength good C4 to T1 distribution No sensory change to C4 to T1 Negative Hoffman sign bilaterally Reflexes normal Less tightness in the trapezius..  Back Exam:  Inspection: Unremarkable  Motion: Flexion 40 deg, Extension 25 deg, Side Bending to 40 deg bilaterally,  Rotation to 40 deg bilaterally  SLR laying: Negative  XSLR laying: Negative  No tenderness over the greater trochanteric  area bilaterally FABER:negative but does have more tightness. Sensory change: Gross sensation intact to all lumbar and sacral dermatomes.  Reflexes: 2+ at both patellar tendons, 2+ at achilles tendons, Babinski's downgoing.  Strength the legs normal. Leg strength  Quad: 5/5 Hamstring: 5/5 Hip flexor: 5/5 Hip abductors: 4+/5  Gait unremarkable.  Osteopathic findings Cervical C2  flexed rotated and side bent left C7 flexed rotated and side bent right  Thoracic T3 extended rotated and side bent left inhale third rib T5 extended rotated and side bent right  Lumbar L2 flexed rotated and side bent right L4 flexed rotated and side bent left  Sacrum Left on left  Right posterior ilium    Impression and Recommendations:     This case required medical decision making of moderate complexity.

## 2016-01-17 NOTE — Assessment & Plan Note (Signed)
Decision today to treat with OMT was based on Physical Exam  After verbal consent patient was treated with HVLA, ME techniques in cervical, thoracic, lumbar and sacral areas  Patient tolerated the procedure well with improvement in symptoms  Patient given exercises, stretches and lifestyle modifications  See medications in patient instructions if given  Patient will follow up in 3-6 weeks   

## 2016-01-17 NOTE — Assessment & Plan Note (Signed)
Resolved after injection 

## 2016-01-17 NOTE — Patient Instructions (Signed)
Go to see you  Melanie [name] is your friend Continue to watch your allergies Lets see you again in 3 week sto make sure everything is doing fine.  If your pain is better then space it out to 6 weeks.

## 2016-01-25 ENCOUNTER — Encounter: Payer: Self-pay | Admitting: Family Medicine

## 2016-01-25 ENCOUNTER — Telehealth: Payer: Self-pay | Admitting: Family Medicine

## 2016-01-25 NOTE — Telephone Encounter (Signed)
Patient states she is having numbness and spasums in back and in foot.  She is requesting injection.  Please follow up in regard.

## 2016-01-28 ENCOUNTER — Ambulatory Visit (INDEPENDENT_AMBULATORY_CARE_PROVIDER_SITE_OTHER): Payer: 59 | Admitting: Physician Assistant

## 2016-01-28 ENCOUNTER — Encounter: Payer: Self-pay | Admitting: Physician Assistant

## 2016-01-28 VITALS — BP 100/70 | HR 82 | Temp 97.4°F | Resp 16 | Ht 62.5 in | Wt 157.0 lb

## 2016-01-28 DIAGNOSIS — H109 Unspecified conjunctivitis: Secondary | ICD-10-CM | POA: Diagnosis not present

## 2016-01-28 MED ORDER — ERYTHROMYCIN 5 MG/GM OP OINT: 1.0000 "application " | TOPICAL_OINTMENT | Freq: Three times a day (TID) | OPHTHALMIC | 0 refills | Status: AC

## 2016-01-28 NOTE — Patient Instructions (Addendum)
If you start to have more purulent drainage please use the ointment as prescribed. If you start to have vision changes, headaches, nausea, or notice difference in pupil size please come back to be seen asap.   Allergic Conjunctivitis Allergic conjunctivitis is inflammation of the clear membrane that covers the white part of your eye and the inner surface of your eyelid (conjunctiva), and it is caused by allergies. The blood vessels in the conjunctiva become inflamed, and this causes the eye to become red or pink, and it often causes itchiness in the eye. Allergic conjunctivitis cannot be spread by one person to another person (noncontagious). CAUSES This condition is caused by an allergic reaction. Common causes of an allergic reaction (allergens) include:  Dust.  Pollen.  Mold.  Animal dander or secretions. RISK FACTORS This condition is more likely to develop if you are exposed to high levels of allergens that cause the allergic reaction. This might include being outdoors when air pollen levels are high or being around animals that you are allergic to. SYMPTOMS Symptoms of this condition may include:  Eye redness.  Tearing of the eyes.  Watery eyes.  Itchy eyes.  Burning feeling in the eyes.  Clear drainage from the eyes.  Swollen eyelids. DIAGNOSIS This condition may be diagnosed by medical history and physical exam. If you have drainage from your eyes, it may be tested to rule out other causes of conjunctivitis. TREATMENT Treatment for this condition often includes medicines. These may be eye drops, ointments, or oral medicines. They may be prescription medicines or over-the-counter medicines. HOME CARE INSTRUCTIONS  Take or apply medicines only as directed by your health care provider.  Do not touch or rub your eyes.  Do not wear contact lenses until the inflammation is gone. Wear glasses instead.  Do not wear eye makeup until the inflammation is gone.  Apply a  cool, clean washcloth to your eye for 10-20 minutes, 3-4 times a day.  Try to avoid whatever allergen is causing the allergic reaction. SEEK MEDICAL CARE IF:  Your symptoms get worse.  You have pus draining from your eye.  You have new symptoms.  You have a fever.   This information is not intended to replace advice given to you by your health care provider. Make sure you discuss any questions you have with your health care provider.   Document Released: 08/16/2002 Document Revised: 06/16/2014 Document Reviewed: 03/07/2014 Elsevier Interactive Patient Education [employer]2016 Elsevier Inc.

## 2016-01-28 NOTE — Telephone Encounter (Signed)
Spoke to pt, she states she is doing better & does not need an injection at this time.

## 2016-01-28 NOTE — Progress Notes (Signed)
Patient ID: Melanie ConroyMilicent D [name], female    DOB: 1961/02/20, 55 y.o.   MRN: [id number]  PCP: Melanie SanesStacy J Burns, MD  Chief Complaint  Patient presents with  . Conjunctivitis    left eye    Subjective:   HPI:  [id number] yof presents with above complaints.   Symptoms began 2 days ago with gritty, itchy left eye. Symptoms have persisted. Yest morning awoke and left eye crusted shut. Minimal amnt clearish, sticky drainage left eye yest. This am again with crusting, slightly worse, now with mild drainage and scratchy feeling in right eye.   Hx bad seasonal allergies. Was out working in yard day prior to symptoms starting. Claritin daily. Rare flonase usage.   Denies HA or nausea. Denies vision changes or decreased vision in either eye. Denies trauma to eyes. Mild photophobia left eye yest. Symptoms are slightly improving this am.    Patient Active Problem List   Diagnosis Date Noted  . Polyarthralgia 08/23/2015  . Migraine 02/28/2015  . Quadriceps tendinitis 11/16/2014  . Neck pain 07/07/2014  . Concussion with no loss of consciousness 07/07/2014  . Nonallopathic lesion of lumbosacral region 11/04/2013  . Nonallopathic lesion of thoracic region 11/04/2013  . Piriformis syndrome of left side 10/14/2013  . Greater trochanteric bursitis of left hip 10/14/2013  . Trapezius muscle spasm 11/26/2012  . Nonallopathic lesion of cervicothoracic region 11/26/2012  . Seasonal allergies 09/28/2012  . Asthma attack 12/01/2011  . Backache 09/26/2009  . OTHER ACQUIRED DEFORMITY OF ANKLE AND FOOT OTHER 09/26/2009  . ABNORMALITY OF GAIT 09/26/2009  . KNEE PAIN, RIGHT 08/08/2009  . TENDINITIS, PATELLAR 08/08/2009    Past Medical History:  Diagnosis Date  . Allergy   . Asthma   . Osteoporosis      Prior to Admission medications   Medication Sig Start Date End Date Taking? Authorizing Provider  albuterol (PROVENTIL HFA;VENTOLIN HFA) 108 (90 Base) MCG/ACT inhaler Inhale 2 puffs into the lungs every 4  (four) hours as needed for wheezing or shortness of breath (cough, shortness of breath or wheezing.). 08/06/15  Yes Elenora GammaSamuel L Bradshaw, MD  azelastine (ASTELIN) 0.1 % nasal spray Place 1 spray into both nostrils 2 (two) times daily. 09/30/14  Yes Wallis BambergMario Mani, PA-C  beclomethasone (QVAR) 40 MCG/ACT inhaler Inhale 1 puff into the lungs 2 (two) times daily. 08/06/15  Yes Elenora GammaSamuel L Bradshaw, MD  cholecalciferol (VITAMIN D) 1000 units tablet Take 2,000 Units by mouth daily.   Yes Historical Provider, MD  loratadine (CLARITIN) 10 MG tablet Take 10 mg by mouth daily.   Yes Historical Provider, MD  Multiple Vitamin (MULTIVITAMIN) tablet Take 1 tablet by mouth daily.   Yes Historical Provider, MD  Probiotic Product (PROBIOTIC DAILY PO) Take by mouth.   Yes Historical Provider, MD  rizatriptan (MAXALT-MLT) 10 MG disintegrating tablet Take 1 tablet (10 mg total) by mouth as needed for migraine. May repeat in 2 hours if needed 02/28/15  Yes Judi SaaZachary M Smith, DO    Allergies  Allergen Reactions  . Kiwi Extract Hives, Shortness Of Breath and Itching  . Latex   . Penicillins   . Sulfonamide Derivatives     Past Surgical History:  Procedure Laterality Date  . ABDOMINAL HYSTERECTOMY    . APPENDECTOMY    . KNEE SURGERY      Family History  Problem Relation Age of Onset  . Heart disease Father [id number]  . Irregular heart beat Mother   . Stroke Maternal Grandmother   . Heart  attack Maternal Grandfather   . Stroke Paternal Grandmother   . Heart attack Paternal Grandfather   . Heart attack Brother [id number]    Social History   Social History  . Marital status: Divorced    Spouse name: N/A  . Number of children: N/A  . Years of education: N/A   Social History Main Topics  . Smoking status: Never Smoker  . Smokeless tobacco: Never Used  . Alcohol use Yes     Comment: 2/wk  . Drug use: No  . Sexual activity: Not Asked   Other Topics Concern  . None   Social History Narrative   Exercises regularly        Review of Systems  Denies severe foreign body sensation, no fevers, chills.     Objective:  Physical Exam  Constitutional: She is oriented to person, place, and time. She appears well-developed and well-nourished.  Non-toxic appearance. She does not have a sickly appearance. She does not appear ill. No distress.  HENT:  Nose: Mucosal edema and rhinorrhea present. Right sinus exhibits no maxillary sinus tenderness and no frontal sinus tenderness. Left sinus exhibits no maxillary sinus tenderness and no frontal sinus tenderness.  Eyes: Pupils are equal, round, and reactive to light. Right eye exhibits no discharge and no exudate. Left eye exhibits discharge. Left eye exhibits no exudate. Right conjunctiva is injected. Left conjunctiva is injected. Right eye exhibits normal extraocular motion. Left eye exhibits normal extraocular motion. Pupils are equal.  Minimal amnt clear drainage/tearing left eye.  No ciliary flush noted.   Neurological: She is alert and oriented to person, place, and time. No cranial nerve deficit.      Assessment & Plan:   Conjunctivitis of left eye - Plan: erythromycin [facility](ROMYCIN) ophthalmic ointment --Suspect conjunctivitis is allergic as onset came after being outdoors, minimal drainage with no purulence, now with both eye involvement  - Pt has known allergies  --no concerning findings with no severe foreign body sensation, no ciliary flush, no visual changes, no fixed pupil, no HA, and no photophobia noted in room with overhead lights or ophthalmoscope  - Encouraged otc drops, continuing daily antihistamine - Pt here for symptomatic relief as she is on the computer daily for work and worried irritation will affect this, discussed risks/benefits of e-mycin topical for symptomatic relief - Agreeable to try drops and compresses and if no relief with this then will pick up emycin ointment --rtc with vision changes, worsening drainage   Donnajean Lopesodd M. Mckinzie Saksa,  PA-C Physician Assistant-Certified [facility]  01/29/2016 12:21 PM

## 2016-01-28 NOTE — Telephone Encounter (Signed)
What type of shot?  If she means epidural I do not have any back imaging.  If she means toradol I could see what I can do this week. I am leaving early today .

## 2016-02-06 NOTE — Progress Notes (Signed)
[name] [address] [place], [place] [address] Phone: [phone] Subjective:     CC: Back pain follow-up Right hip pain  [id number]  Melanie [name] is a 55 y.o. female coming in with complaint of    Neck pain .  Mild increasing tenderness and tightness recently. Patient states that she continues to have some tightness. Patient is going out of town and will not be sleeping in her regular bed. Concerned about that. Patient would like a manipulation to make sure that this is as good as it will being so when she gets back we can be seen again.  Patient is also complaining of severe right hip pain. Patient feels like she pulled her groin. Was not doing anything significant though. Patient states that started having discomfort a little bit. Patient states it's worse with any type of weightbearing. Had been walking more recently but that is about it. No recent illnesses. Was seen for conjunctivitis when reviewing chart.    Past Medical History:  Diagnosis Date  . Allergy   . Asthma   . Osteoporosis    Past Surgical History:  Procedure Laterality Date  . ABDOMINAL HYSTERECTOMY    . APPENDECTOMY    . KNEE SURGERY     Social History  Substance Use Topics  . Smoking status: Never Smoker  . Smokeless tobacco: Never Used  . Alcohol use Yes     Comment: 2/wk   Allergies  Allergen Reactions  . Kiwi Extract Hives, Shortness Of Breath and Itching  . Latex   . Penicillins   . Sulfonamide Derivatives    Family History  Problem Relation Age of Onset  . Heart disease Father 35  . Irregular heart beat Mother   . Stroke Maternal Grandmother   . Heart attack Maternal Grandfather   . Stroke Paternal Grandmother   . Heart attack Paternal Grandfather   . Heart attack Brother 45    Past medical history, social, surgical and family history all reviewed in electronic medical record.   Review of Systems: No headache, visual changes, nausea,  vomiting, diarrhea, constipation, dizziness, abdominal pain, skin rash, fevers, chills, night sweats, weight loss, swollen lymph nodes, body aches, joint swelling, muscle aches, chest pain, shortness of breath, mood changes.   Objective  There were no vitals taken for this visit.  General: No apparent distress alert and oriented x3 mood and affect normal, dressed appropriately.  HEENT: Pupils equal, extraocular movements intact  Respiratory: Patient's speak in full sentences and does not appear short of breath Mild expiratory wheezes noted and patient is causing fairly regularly. Cardiovascular: No lower extremity edema, non tender, no erythema  Skin: Warm dry intact with no signs of infection or rash on extremities or on axial skeleton.  Abdomen: Soft nontender  Neuro: Cranial nerves II through XII are intact, neurovascularly intact in all extremities with 2+ DTRs and 2+ pulses.  Lymph: No lymphadenopathy of posterior or anterior cervical chain or axillae bilaterally.  Gait normal with good balance and coordination.  MSK:  Non tender with full range of motion and good stability and symmetric strength and tone of shoulders, elbows, wrist, knee,  hip, and ankles bilaterally.      Neck: Inspection unremarkable. No palpable stepoffs. Negative Spurling's maneuver. Patient is having increasing range of motion from previous exam. Grip strength and sensation normal in bilateral hands Strength good C4 to T1 distribution No sensory change to C4 to T1 Negative Hoffman sign bilaterally Reflexes  normal Less tightness in the trapezius..  Back Exam:  Inspection: Unremarkable  Motion: Flexion 40 deg, Extension 25 deg, Side Bending to 40 deg bilaterally,  Rotation to 40 deg bilaterally  SLR laying: Negative  XSLR laying: Negative  No tenderness over the greater trochanteric area bilaterally FABER:negative but does have more tightness. Sensory change: Gross sensation intact to all lumbar  and sacral dermatomes.  Reflexes: 2+ at both patellar tendons, 2+ at achilles tendons, Babinski's downgoing.  Strength the legs normal. Leg strength   Patient's right hip done that show the patient has decreasing range of motion. Lacking the last 10 of external rotation in the last 10 of internal rotation. Patient has pain with forward flexion. Positive grind test. Tender to palpation over the groin area as well. Gait unremarkable.  Osteopathic findings Cervical C2 flexed rotated and side bent left C7 flexed rotated and side bent right  Thoracic T3 extended rotated and side bent left inhale third rib T5 extended rotated and side bent right  Lumbar L2 flexed rotated and side bent right   Sacrum Left on left  Right posterior ilium    Impression and Recommendations:     This case required medical decision making of moderate complexity.

## 2016-02-07 ENCOUNTER — Encounter: Payer: Self-pay | Admitting: Family Medicine

## 2016-02-07 ENCOUNTER — Ambulatory Visit (INDEPENDENT_AMBULATORY_CARE_PROVIDER_SITE_OTHER)
Admission: RE | Admit: 2016-02-07 | Discharge: 2016-02-07 | Disposition: A | Discharge status: Home / Self Care | Payer: 59 | Source: Ambulatory Visit | Attending: Family Medicine | Admitting: Family Medicine

## 2016-02-07 ENCOUNTER — Ambulatory Visit (INDEPENDENT_AMBULATORY_CARE_PROVIDER_SITE_OTHER): Payer: 59 | Admitting: Family Medicine

## 2016-02-07 VITALS — BP 130/70 | HR 83 | Wt 156.0 lb

## 2016-02-07 DIAGNOSIS — M25551 Pain in right hip: Secondary | ICD-10-CM | POA: Insufficient documentation

## 2016-02-07 DIAGNOSIS — M9902 Segmental and somatic dysfunction of thoracic region: Secondary | ICD-10-CM

## 2016-02-07 DIAGNOSIS — M9903 Segmental and somatic dysfunction of lumbar region: Secondary | ICD-10-CM

## 2016-02-07 DIAGNOSIS — M9901 Segmental and somatic dysfunction of cervical region: Secondary | ICD-10-CM | POA: Diagnosis not present

## 2016-02-07 DIAGNOSIS — M999 Biomechanical lesion, unspecified: Secondary | ICD-10-CM

## 2016-02-07 MED ORDER — METHYLPREDNISOLONE ACETATE 80 MG/ML IJ SUSP
80.0000 mg | Freq: Once | INTRAMUSCULAR | Status: AC
  Administered 2016-02-07: 80 mg via INTRAMUSCULAR

## 2016-02-07 MED ORDER — KETOROLAC TROMETHAMINE 60 MG/2ML IM SOLN
60.0000 mg | Freq: Once | INTRAMUSCULAR | Status: AC
  Administered 2016-02-07: 60 mg via INTRAMUSCULAR

## 2016-02-07 NOTE — Assessment & Plan Note (Addendum)
Patient is having more of a right hip pain. Describes the pain as a dull, throbbing aching sensation. Seems to be a quick onset. Has had difficulties with inflammation previously. Concern is patient does have a decreased range of motion and severe pain. Given 2 injections to help. Unable to tolerate prednisone or anti-inflammatories by mouth. We discussed icing regimen. Patient will avoid certain activities. Follow-up again in 2-3 weeks. Patient has had reactive synovitis's of the knees previously. This could be within the differential as well. We'll await x-rays. Worsening symptoms MRI would be needed.

## 2016-02-07 NOTE — Assessment & Plan Note (Signed)
Decision today to treat with OMT was based on Physical Exam  After verbal consent patient was treated with HVLA, ME techniques in cervical, thoracic, lumbar and sacral areas  Patient tolerated the procedure well with improvement in symptoms  Patient given exercises, stretches and lifestyle modifications  See medications in patient instructions if given  Patient will follow up in 3-6 weeks   

## 2016-02-07 NOTE — Patient Instructions (Signed)
Good see you  Sorry for the bad news.  We gave you 2 injections today  Ice is your friend.  Continue the vitamin D  Avoid excessive walking if you can Only low impact exercises.  See me again in 10-15 days. (OK to double book new patient)

## 2016-02-20 NOTE — Progress Notes (Signed)
[name] [address] [place], [place] [address] Phone: [phone] Subjective:     CC: Back pain follow-up Right hip pain  HQI:[id number]:Subjective  Melanie [name] is a 55 y.o. female coming in with complaint of    Neck pain .  Mild increasing tenderness and tightness recently. Had this for quite some time. Has responded fairly well until manipulation. Patient continue to work on Posture. Continues to have discomfort more in the left trapezius muscle. More of a tightness. Likely associated to some stress.  Patient was seen previously and did have some right hip pain. Concern for possible synovitis or possible intra-articular pathology. Patient states though that the pain is almost completely resolved at this time. Very happy at the moment and is able to do the dancing which she is doing very regularly at the moment.    Past Medical History:  Diagnosis Date  . Allergy   . Asthma   . Osteoporosis    Past Surgical History:  Procedure Laterality Date  . ABDOMINAL HYSTERECTOMY    . APPENDECTOMY    . KNEE SURGERY     Social History  Substance Use Topics  . Smoking status: Never Smoker  . Smokeless tobacco: Never Used  . Alcohol use Yes     Comment: 2/wk   Allergies  Allergen Reactions  . Kiwi Extract Hives, Shortness Of Breath and Itching  . Latex   . Penicillins   . Sulfonamide Derivatives    Family History  Problem Relation Age of Onset  . Heart disease Father 7539  . Irregular heart beat Mother   . Stroke Maternal Grandmother   . Heart attack Maternal Grandfather   . Stroke Paternal Grandmother   . Heart attack Paternal Grandfather   . Heart attack Brother 45    Past medical history, social, surgical and family history all reviewed in electronic medical record.   Review of Systems: No headache, visual changes, nausea, vomiting, diarrhea, constipation, dizziness, abdominal pain, skin rash, fevers, chills, night sweats, weight loss, swollen  lymph nodes, body aches, joint swelling, muscle aches, chest pain, shortness of breath, mood changes.   Objective  Blood pressure 120/82, pulse 100, weight 157 lb (71.2 kg), SpO2 99 %.  General: No apparent distress alert and oriented x3 mood and affect normal, dressed appropriately.  HEENT: Pupils equal, extraocular movements intact  Respiratory: Patient's speak in full sentences and does not appear short of breath Mild expiratory wheezes noted and patient is causing fairly regularly. Cardiovascular: No lower extremity edema, non tender, no erythema  Skin: Warm dry intact with no signs of infection or rash on extremities or on axial skeleton.  Abdomen: Soft nontender  Neuro: Cranial nerves II through XII are intact, neurovascularly intact in all extremities with 2+ DTRs and 2+ pulses.  Lymph: No lymphadenopathy of posterior or anterior cervical chain or axillae bilaterally.  Gait normal with good balance and coordination.  MSK:  Non tender with full range of motion and good stability and symmetric strength and tone of shoulders, elbows, wrist, knee,  hip, and ankles bilaterally.      Neck: Inspection unremarkable. No palpable stepoffs. Negative Spurling's maneuver. Patient is having increasing range of motion from previous exam. Grip strength and sensation normal in bilateral hands Strength good C4 to T1 distribution No sensory change to C4 to T1 Negative Hoffman sign bilaterally Reflexes normal Less tightness in the trapezius..  Back Exam:  Inspection: Unremarkable  Motion: Flexion 40 deg, Extension 25  deg, Side Bending to 40 deg bilaterally,  Rotation to 40 deg bilaterally  SLR laying: Negative  XSLR laying: Negative  No tenderness over the greater trochanteric area bilaterally FABER:negative  Sensory change: Gross sensation intact to all lumbar and sacral dermatomes.  Reflexes: 2+ at both patellar tendons, 2+ at achilles tendons, Babinski's downgoing.  Strength the  legs normal. Leg strength   Hip exam is completely unremarkable  Osteopathic findings Cervical C2 flexed rotated and side bent left C7 flexed rotated and side bent right  Thoracic T3 extended rotated and side bent left inhale third rib T5 extended rotated and side bent right  Lumbar L2 flexed rotated and side bent right   Sacrum Left on left  Right posterior ilium    Impression and Recommendations:     This case required medical decision making of moderate complexity.

## 2016-02-21 ENCOUNTER — Ambulatory Visit (INDEPENDENT_AMBULATORY_CARE_PROVIDER_SITE_OTHER): Payer: 59 | Admitting: Family Medicine

## 2016-02-21 ENCOUNTER — Encounter: Payer: Self-pay | Admitting: Family Medicine

## 2016-02-21 VITALS — BP 120/82 | HR 100 | Wt 157.0 lb

## 2016-02-21 DIAGNOSIS — M9902 Segmental and somatic dysfunction of thoracic region: Secondary | ICD-10-CM | POA: Diagnosis not present

## 2016-02-21 DIAGNOSIS — M542 Cervicalgia: Secondary | ICD-10-CM

## 2016-02-21 DIAGNOSIS — M9901 Segmental and somatic dysfunction of cervical region: Secondary | ICD-10-CM

## 2016-02-21 DIAGNOSIS — M9903 Segmental and somatic dysfunction of lumbar region: Secondary | ICD-10-CM

## 2016-02-21 DIAGNOSIS — M999 Biomechanical lesion, unspecified: Secondary | ICD-10-CM

## 2016-02-21 NOTE — Patient Instructions (Signed)
God to see you  I am glad the hip is better Stay active and I think Pryor MontesSalsa is the key! Also keep with a little weight lifting See me again in 5-6 weeks.

## 2016-02-21 NOTE — Assessment & Plan Note (Signed)
Decision today to treat with OMT was based on Physical Exam  After verbal consent patient was treated with HVLA, ME techniques in cervical, thoracic, lumbar and sacral areas  Patient tolerated the procedure well with improvement in symptoms  Patient given exercises, stretches and lifestyle modifications  See medications in patient instructions if given  Patient will follow up in 3-6 weeks   

## 2016-02-21 NOTE — Assessment & Plan Note (Signed)
Continues to seem to be her chronic problem. Likely some of it is secondary distress as well as posture. We discussed about possible further workup which patient has declined. We discussed icing regimen and continuing with the conservative therapy otherwise in follow-up in 6 weeks.

## 2016-03-14 ENCOUNTER — Ambulatory Visit (INDEPENDENT_AMBULATORY_CARE_PROVIDER_SITE_OTHER): Payer: 59 | Admitting: Nurse Practitioner

## 2016-03-14 ENCOUNTER — Encounter: Payer: Self-pay | Admitting: Nurse Practitioner

## 2016-03-14 VITALS — BP 132/92 | Temp 98.3°F | Ht 62.0 in | Wt 157.0 lb

## 2016-03-14 DIAGNOSIS — J069 Acute upper respiratory infection, unspecified: Secondary | ICD-10-CM

## 2016-03-14 DIAGNOSIS — J45901 Unspecified asthma with (acute) exacerbation: Secondary | ICD-10-CM

## 2016-03-14 MED ORDER — HYDROCODONE-HOMATROPINE 5-1.5 MG/5ML PO SYRP: 5.0000 mL | ORAL_SOLUTION | Freq: Every evening | ORAL | 0 refills | Status: DC | PRN

## 2016-03-14 MED ORDER — IPRATROPIUM-ALBUTEROL 0.5-2.5 (3) MG/3ML IN SOLN: 3.0000 mL | Freq: Once | RESPIRATORY_TRACT | Status: DC

## 2016-03-14 MED ORDER — GUAIFENESIN ER 600 MG PO TB12: 600.0000 mg | ORAL_TABLET | Freq: Two times a day (BID) | ORAL | 0 refills | Status: DC | PRN

## 2016-03-14 MED ORDER — IPRATROPIUM BROMIDE 0.03 % NA SOLN: 2.0000 | Freq: Two times a day (BID) | NASAL | 12 refills | Status: DC

## 2016-03-14 MED ORDER — METHYLPREDNISOLONE ACETATE 80 MG/ML IJ SUSP: 80.0000 mg | Freq: Once | INTRAMUSCULAR | Status: DC

## 2016-03-14 NOTE — Patient Instructions (Signed)
[facility] Instructions: Call office for oral abx prescription if no improvement in 1week.  Use over-the-counter  "cold" medicines  such as "Tylenol cold" , "Advil cold",  "Mucinex" or" Mucinex D"  for cough and congestion.  Avoid decongestants if you have high blood pressure. Use" Delsym" or" Robitussin" cough syrup varietis for cough.  You can use plain "Tylenol" or "Advi"l for fever, chills and achyness.   "Common cold" symptoms are usually triggered by a virus.  The antibiotics are usually not necessary. On average, a" viral cold" illness would take 4-7 days to resolve. Please, make an appointment if you are not better or if you're worse.

## 2016-03-14 NOTE — Progress Notes (Signed)
Subjective:  Patient ID: Melanie [name], female    DOB: [birth year]  Age: 55 y.o. MRN: [id number]  CC: Headache (Pt stated having headache, nausea, coughing, diarrhea for  3 days)  URI   This is a new problem. The current episode started in the past 7 days. The problem has been unchanged. There has been no fever. Associated symptoms include congestion, coughing, headaches, a plugged ear sensation, rhinorrhea, sinus pain, sneezing, a sore throat and swollen glands. Pertinent negatives include no abdominal pain, diarrhea, dysuria, vomiting or wheezing. Associated symptoms comments: Diarrhea x 2days, but now resolved.. She has tried acetaminophen and inhaler use for the symptoms. The treatment provided mild relief.    Outpatient Medications Prior to Visit  Medication Sig Dispense Refill  . albuterol (PROVENTIL HFA;VENTOLIN HFA) 108 (90 Base) MCG/ACT inhaler Inhale 2 puffs into the lungs every 4 (four) hours as needed for wheezing or shortness of breath (cough, shortness of breath or wheezing.). 1 Inhaler 1  . azelastine (ASTELIN) 0.1 % nasal spray Place 1 spray into both nostrils 2 (two) times daily. 30 mL 12  . beclomethasone (QVAR) 40 MCG/ACT inhaler Inhale 1 puff into the lungs 2 (two) times daily. 1 Inhaler 2  . cholecalciferol (VITAMIN D) 1000 units tablet Take 2,000 Units by mouth daily.    [name] loratadine (CLARITIN) 10 MG tablet Take 10 mg by mouth daily.    . Multiple Vitamin (MULTIVITAMIN) tablet Take 1 tablet by mouth daily.    . Probiotic Product (PROBIOTIC DAILY PO) Take by mouth.    . rizatriptan (MAXALT-MLT) 10 MG disintegrating tablet Take 1 tablet (10 mg total) by mouth as needed for migraine. May repeat in 2 hours if needed 10 tablet 1   No facility-administered medications prior to visit.     ROS See HPI  Objective:  BP (!) 132/92   Temp 98.3 F (36.8 C)   Ht 5\' 2"  (1.575 m)   Wt 157 lb (71.2 kg)   SpO2 98%   BMI 28.72 kg/m   BP Readings from Last 3 Encounters:    03/14/16 (!) 132/92  02/21/16 120/82  02/07/16 130/70    Wt Readings from Last 3 Encounters:  03/14/16 157 lb (71.2 kg)  02/21/16 157 lb (71.2 kg)  02/07/16 156 lb (70.8 kg)    Physical Exam  Constitutional: She is oriented to person, place, and time. She appears well-nourished.  HENT:  Mouth/Throat: No oropharyngeal exudate.  Neck: Normal range of motion. Neck supple.  Cardiovascular: Normal rate and normal heart sounds.   Pulmonary/Chest: Effort normal. No respiratory distress. She has wheezes.  Abdominal: Soft. She exhibits distension. There is no tenderness.  Musculoskeletal: She exhibits no edema.  Neurological: She is alert and oriented to person, place, and time.  Skin: Skin is warm and dry.  Psychiatric: She has a normal mood and affect. Her behavior is normal.  Vitals reviewed.   Lab Results  Component Value Date   WBC 5.3 09/14/2015   HGB 12.6 09/14/2015   HCT 36.8 09/14/2015   PLT 166.0 09/14/2015   GLUCOSE 88 09/14/2015   CHOL 225 (H) 09/14/2015   TRIG 52.0 09/14/2015   HDL 73.60 09/14/2015   LDLCALC 141 (H) 09/14/2015   ALT 35 09/14/2015   AST 22 09/14/2015   NA 139 09/14/2015   K 3.9 09/14/2015   CL 104 09/14/2015   CREATININE 0.88 09/14/2015   BUN 15 09/14/2015   CO2 29 09/14/2015   TSH 1.58 09/14/2015  Dg Hip Unilat With Pelvis 2-3 Views Right  Result Date: 02/07/2016 CLINICAL DATA:  Right hip pain for 3 days without known injury. EXAM: DG HIP (WITH OR WITHOUT PELVIS) 2-3V RIGHT COMPARISON:  None. FINDINGS: There is no evidence of hip fracture or dislocation. There is no evidence of arthropathy or other focal bone abnormality. Small amount of soft tissue gas is seen lateral to the superior acetabulum which potentially may represent infection. IMPRESSION: No fracture or dislocation is noted. Small amount of soft tissue gas seen lateral to superior aspect of acetabulum which potentially may represent infection. Clinical correlation is recommended.  Electronically Signed   By: Lupita Raider, M.D.   On: 02/07/2016 16:15    Assessment & Plan:   Shela was seen today for headache.  Diagnoses and all orders for this visit:  Acute URI -     ipratropium-albuterol (DUONEB) 0.5-2.5 (3) MG/3ML nebulizer solution 3 mL; Take 3 mLs by nebulization once. -     guaiFENesin (MUCINEX) 600 MG 12 hr tablet; Take 1 tablet (600 mg total) by mouth 2 (two) times daily as needed for cough or to loosen phlegm. -     ipratropium (ATROVENT) 0.03 % nasal spray; Place 2 sprays into both nostrils every 12 (twelve) hours. -     HYDROcodone-homatropine (HYCODAN) 5-1.5 MG/5ML syrup; Take 5 mLs by mouth at bedtime as needed for cough. -     methylPREDNISolone acetate (DEPO-MEDROL) injection 80 mg; Inject 1 mL (80 mg total) into the muscle once.  Asthma with acute exacerbation, unspecified asthma severity, unspecified whether persistent -     ipratropium-albuterol (DUONEB) 0.5-2.5 (3) MG/3ML nebulizer solution 3 mL; Take 3 mLs by nebulization once. -     guaiFENesin (MUCINEX) 600 MG 12 hr tablet; Take 1 tablet (600 mg total) by mouth 2 (two) times daily as needed for cough or to loosen phlegm. -     ipratropium (ATROVENT) 0.03 % nasal spray; Place 2 sprays into both nostrils every 12 (twelve) hours. -     HYDROcodone-homatropine (HYCODAN) 5-1.5 MG/5ML syrup; Take 5 mLs by mouth at bedtime as needed for cough. -     methylPREDNISolone acetate (DEPO-MEDROL) injection 80 mg; Inject 1 mL (80 mg total) into the muscle once.   I am having Ms. Funderburg start on guaiFENesin, ipratropium, and HYDROcodone-homatropine. I am also having her maintain her loratadine, azelastine, rizatriptan, multivitamin, albuterol, beclomethasone, cholecalciferol, and Probiotic Product (PROBIOTIC DAILY PO). We will continue to administer ipratropium-albuterol and methylPREDNISolone acetate.  Meds ordered this encounter  Medications  . ipratropium-albuterol (DUONEB) 0.5-2.5 (3) MG/3ML nebulizer  solution 3 mL  . guaiFENesin (MUCINEX) 600 MG 12 hr tablet    Sig: Take 1 tablet (600 mg total) by mouth 2 (two) times daily as needed for cough or to loosen phlegm.    Dispense:  14 tablet    Refill:  0    Order Specific Question:   Supervising Provider    Answer:   Tresa Garter [[id number]]  . ipratropium (ATROVENT) 0.03 % nasal spray    Sig: Place 2 sprays into both nostrils every 12 (twelve) hours.    Dispense:  30 mL    Refill:  12    Order Specific Question:   Supervising Provider    Answer:   Tresa Garter [[id number]]  . HYDROcodone-homatropine (HYCODAN) 5-1.5 MG/5ML syrup    Sig: Take 5 mLs by mouth at bedtime as needed for cough.    Dispense:  120 mL  Refill:  0    Order Specific Question:   Supervising Provider    Answer:   Tresa GarterPLOTNIKOV, ALEKSEI V [[id number]]  . methylPREDNISolone acetate (DEPO-MEDROL) injection 80 mg    Follow-up: Return if symptoms worsen or fail to improve.  Alysia Pennaharlotte Shellene Sweigert, NP

## 2016-03-26 NOTE — Assessment & Plan Note (Signed)
Continues to have significant muscular skeletal complaints. Continues to try to be active. Continues the minimal medications on a regular basis. Patient is doing well we will continue seeing her in 4-6 week intervals. Has been responding well to conservative therapy including osteopathic manipulation.

## 2016-03-26 NOTE — Progress Notes (Signed)
[name] [address] [place], [place] [address] Phone: [phone] Subjective:     CC: Back pain follow-up Right hip pain  [id number]  Melanie [name] is a 55 y.o. female coming in with complaint of    Neck pain . Not as much tightness, a little pain On the left side but nothing severe. Has started working out more. Did have a cough and seems to given sometrouble.   Hip is feeling better as well. Mild tightness of the left side lower back. No radiation of pain. No numbness or tingling. Overall very mild.   Past Medical History:  Diagnosis Date  . Allergy   . Asthma   . Osteoporosis    Past Surgical History:  Procedure Laterality Date  . ABDOMINAL HYSTERECTOMY    . APPENDECTOMY    . KNEE SURGERY     Social History  Substance Use Topics  . Smoking status: Never Smoker  . Smokeless tobacco: Never Used  . Alcohol use Yes     Comment: 2/wk   Allergies  Allergen Reactions  . Kiwi Extract Hives, Shortness Of Breath and Itching  . Latex   . Penicillins   . Sulfonamide Derivatives    Family History  Problem Relation Age of Onset  . Heart disease Father 25  . Irregular heart beat Mother   . Stroke Maternal Grandmother   . Heart attack Maternal Grandfather   . Stroke Paternal Grandmother   . Heart attack Paternal Grandfather   . Heart attack Brother 45    Past medical history, social, surgical and family history all reviewed in electronic medical record.   Review of Systems: No headache, visual changes, nausea, vomiting, diarrhea, constipation, dizziness, abdominal pain, skin rash, fevers, chills, night sweats, weight loss, swollen lymph nodes, body aches, joint swelling, muscle aches, chest pain, shortness of breath, mood changes.   Objective  There were no vitals taken for this visit.  General: No apparent distress alert and oriented x3 mood and affect normal, dressed appropriately.  HEENT: Pupils equal, extraocular  movements intact  Respiratory: Patient's speak in full sentences and does not appear short of breath Mild expiratory wheezes noted and patient is causing fairly regularly. Cardiovascular: No lower extremity edema, non tender, no erythema  Skin: Warm dry intact with no signs of infection or rash on extremities or on axial skeleton.  Abdomen: Soft nontender  Neuro: Cranial nerves II through XII are intact, neurovascularly intact in all extremities with 2+ DTRs and 2+ pulses.  Lymph: No lymphadenopathy of posterior or anterior cervical chain or axillae bilaterally.  Gait normal with good balance and coordination.  MSK:  Non tender with full range of motion and good stability and symmetric strength and tone of shoulders, elbows, wrist, knee,  hip, and ankles bilaterally.      Neck: Inspection unremarkable. No palpable stepoffs. Negative Spurling's maneuver. Patient is having increasing range of motion from previous exam. Grip strength and sensation normal in bilateral hands Strength good C4 to T1 distribution No sensory change to C4 to T1 Negative Hoffman sign bilaterally Reflexes normal Less tightness in the trapezius..  Back Exam:  Inspection: Unremarkable  Motion: Flexion 40 deg, Extension 25 deg, Side Bending to 40 deg bilaterally,  Rotation to 40 deg bilaterally  SLR laying: Negative  XSLR laying: Negative  No tenderness over the greater trochanteric area bilaterally FABER:negative  Sensory change: Gross sensation intact to all lumbar and sacral dermatomes.  Reflexes: 2+ at both patellar  tendons, 2+ at achilles tendons, Babinski's downgoing.  Strength the legs normal. Leg strength   Hip exam is completely unremarkable  Osteopathic findings Cervical C2 flexed rotated and side bent left C7 flexed rotated and side bent right  Thoracic T3 extended rotated and side bent left inhale third rib T5 extended rotated and side bent right  Lumbar L2 flexed rotated and  side bent right  Sacrum Left on left  Right posterior ilium    Impression and Recommendations:     This case required medical decision making of moderate complexity.

## 2016-03-26 NOTE — Assessment & Plan Note (Signed)
Decision today to treat with OMT was based on Physical Exam  After verbal consent patient was treated with HVLA, ME techniques in cervical, thoracic, lumbar and sacral areas  Patient tolerated the procedure well with improvement in symptoms  Patient given exercises, stretches and lifestyle modifications  See medications in patient instructions if given  Patient will follow up in 3-6 weeks   

## 2016-03-27 ENCOUNTER — Encounter: Payer: Self-pay | Admitting: Family Medicine

## 2016-03-27 ENCOUNTER — Ambulatory Visit (INDEPENDENT_AMBULATORY_CARE_PROVIDER_SITE_OTHER): Payer: 59 | Admitting: Family Medicine

## 2016-03-27 VITALS — BP 122/82 | HR 57 | Wt 158.0 lb

## 2016-03-27 DIAGNOSIS — M546 Pain in thoracic spine: Secondary | ICD-10-CM

## 2016-03-27 DIAGNOSIS — G8929 Other chronic pain: Secondary | ICD-10-CM | POA: Diagnosis not present

## 2016-03-27 DIAGNOSIS — M999 Biomechanical lesion, unspecified: Secondary | ICD-10-CM | POA: Diagnosis not present

## 2016-03-27 NOTE — Patient Instructions (Signed)
Good to see you.  New stretches after working out.  Compression sleeve to the calf I am impressed, keep it up

## 2016-05-08 ENCOUNTER — Ambulatory Visit (INDEPENDENT_AMBULATORY_CARE_PROVIDER_SITE_OTHER): Payer: 59 | Admitting: Family Medicine

## 2016-05-08 ENCOUNTER — Encounter: Payer: Self-pay | Admitting: Family Medicine

## 2016-05-08 VITALS — BP 118/50 | HR 66 | Ht 62.0 in | Wt 159.0 lb

## 2016-05-08 DIAGNOSIS — M999 Biomechanical lesion, unspecified: Secondary | ICD-10-CM | POA: Diagnosis not present

## 2016-05-08 DIAGNOSIS — G5702 Lesion of sciatic nerve, left lower limb: Secondary | ICD-10-CM

## 2016-05-08 DIAGNOSIS — M2550 Pain in unspecified joint: Secondary | ICD-10-CM

## 2016-05-08 NOTE — Patient Instructions (Signed)
Good to see you  Gustavus Bryantce is your friend.  I am so impressed Keep doing the cinnamon and ginger, seems to make a difference.  Keep doing the same workout routine for now.  See me again in 6 weeks.  Happy New Year! Happy holidays!

## 2016-05-08 NOTE — Assessment & Plan Note (Signed)
Decision today to treat with OMT was based on Physical Exam  After verbal consent patient was treated with HVLA, ME techniques in cervical, thoracic, lumbar and sacral areas  Patient tolerated the procedure well with improvement in symptoms  Patient given exercises, stretches and lifestyle modifications  See medications in patient instructions if given  Patient will follow up in 3-6 weeks   

## 2016-05-08 NOTE — Assessment & Plan Note (Signed)
Stable moment. No significant changes. Patient is taking acetaminophen and Ginger which seems to be helping.

## 2016-05-08 NOTE — Progress Notes (Signed)
Melanie ScaleZach [name] D.O. [facility] [address] [place], [place] [address] Phone: [phone] Subjective:     CC: Back pain follow-up Right hip pain  ION:[id number]:Subjective  Melanie [name] is a 55 y.o. female coming in with complaint of    Neck pain .Mild increasing tightness. Nothing severe at this time. Patient seems to be doing relatively well. Working on posture. Doing more weightbearing exercises and strengthening overall not bad just tigher. No radiation of pain no numbness.    Hip is feeling better as well. Back is feeling better as well. Patient has been doing significant more body weight activity and think that this is been helpful.   Past Medical History:  Diagnosis Date  . Allergy   . Asthma   . Osteoporosis    Past Surgical History:  Procedure Laterality Date  . ABDOMINAL HYSTERECTOMY    . APPENDECTOMY    . KNEE SURGERY     Social History  Substance Use Topics  . Smoking status: Never Smoker  . Smokeless tobacco: Never Used  . Alcohol use Yes     Comment: 2/wk   Allergies  Allergen Reactions  . Kiwi Extract Hives, Shortness Of Breath and Itching  . Latex   . Penicillins   . Sulfonamide Derivatives    Family History  Problem Relation Age of Onset  . Heart disease Father 1039  . Irregular heart beat Mother   . Stroke Maternal Grandmother   . Heart attack Maternal Grandfather   . Stroke Paternal Grandmother   . Heart attack Paternal Grandfather   . Heart attack Brother 45    Past medical history, social, surgical and family history all reviewed in electronic medical record.   Review of Systems: No headache, visual changes, nausea, vomiting, diarrhea, constipation, dizziness, abdominal pain, skin rash, fevers, chills, night sweats, weight loss, swollen lymph nodes, body aches, joint swelling, muscle aches, chest pain, shortness of breath, mood changes.   Objective  Blood pressure (!) 118/50, pulse 66, height 5\' 2"  (1.575 m), weight 159 lb (72.1 kg).   Systems examined below as of 05/08/16 General: NAD A&O x3 mood, affect normal  HEENT: Pupils equal, extraocular movements intact no nystagmus Respiratory: not short of breath at rest or with speaking Cardiovascular: No lower extremity edema, non tender Skin: Warm dry intact with no signs of infection or rash on extremities or on axial skeleton. Abdomen: Soft nontender, no masses Neuro: Cranial nerves  intact, neurovascularly intact in all extremities with 2+ DTRs and 2+ pulses. Lymph: No lymphadenopathy appreciated today  Gait normal with good balance and coordination.  MSK: Non tender with full range of motion and good stability and symmetric strength and tone of shoulders, elbows, wrist,  knee hips and ankles bilaterally.    Neck: Inspection unremarkable. No palpable stepoffs. Negative Spurling's maneuver. Mild increasing tightness from previous exam. Grip strength and sensation normal in bilateral hands Strength good C4 to T1 distribution No sensory change to C4 to T1 Negative Hoffman sign bilaterally Reflexes normal Less tightness in the trapezius..  Back Exam:  Inspection: Unremarkable  Motion: Flexion 35 deg, Extension 20 deg, Side Bending to 40 deg bilaterally,  Rotation to 40 deg bilaterally very mild increasing tightness from previous exam SLR laying: Negative  XSLR laying: Negative  Nontender on exam FABER:negative  Sensory change: Gross sensation intact to all lumbar and sacral dermatomes.  Reflexes: 2+ at both patellar tendons, 2+ at achilles tendons, Babinski's downgoing.  Strength the legs normal. Leg strength  Osteopathic findings Cervical C2 flexed rotated and side bent left C6 flexed rotated and side bent right  Thoracic T3 extended rotated and side bent left inhale third rib T8 extended rotated and side bent right  Lumbar L2 flexed rotated and side bent right   Sacrum Left on left  Ileum neutral    Impression and  Recommendations:     This case required medical decision making of moderate complexity.

## 2016-05-08 NOTE — Assessment & Plan Note (Signed)
Has been doing relatively well overall. I think the patient is stable. No significant change in management.

## 2016-05-22 ENCOUNTER — Ambulatory Visit (INDEPENDENT_AMBULATORY_CARE_PROVIDER_SITE_OTHER): Payer: 59 | Admitting: Family Medicine

## 2016-05-22 ENCOUNTER — Encounter: Payer: Self-pay | Admitting: Family Medicine

## 2016-05-22 DIAGNOSIS — R269 Unspecified abnormalities of gait and mobility: Secondary | ICD-10-CM

## 2016-05-22 NOTE — Progress Notes (Signed)
Procedure Note   Patient was fitted for a : standard, cushioned, semi-rigid orthotic. The orthotic was heated and afterward the patient patient seated position and molded The patient was positioned in subtalar neutral position and 10 degrees of ankle dorsiflexion in a weight bearing stance. After completion of molding, patient did have orthotic management The blank was ground to a stable position for weight bearing. Size: Women's size 8, Comfort Base: Carbon fiber Additional Posting and Padding: Right foot, 250/50 posting medially Left foot, 200/50 posting medially The patient ambulated these, and they were very comfortable.

## 2016-05-22 NOTE — Assessment & Plan Note (Signed)
Patient placed in custom orthotics today. We'll slowly increase wear over the course the next several weeks. We discussed if any worsening pain wear them less. Patient will continue with her other regimen. Follow-up again in 2-4 weeks.

## 2016-06-03 DIAGNOSIS — M25551 Pain in right hip: Secondary | ICD-10-CM | POA: Diagnosis not present

## 2016-06-13 ENCOUNTER — Encounter: Payer: Self-pay | Admitting: Internal Medicine

## 2016-06-13 DIAGNOSIS — J4530 Mild persistent asthma, uncomplicated: Secondary | ICD-10-CM

## 2016-06-13 MED ORDER — BECLOMETHASONE DIPROPIONATE 40 MCG/ACT IN AERS: 1.0000 | INHALATION_SPRAY | Freq: Two times a day (BID) | RESPIRATORY_TRACT | 3 refills | Status: DC

## 2016-06-13 MED ORDER — ALBUTEROL SULFATE HFA 108 (90 BASE) MCG/ACT IN AERS: 2.0000 | INHALATION_SPRAY | RESPIRATORY_TRACT | 1 refills | Status: DC | PRN

## 2016-06-19 ENCOUNTER — Encounter: Payer: Self-pay | Admitting: Family Medicine

## 2016-06-19 ENCOUNTER — Ambulatory Visit (INDEPENDENT_AMBULATORY_CARE_PROVIDER_SITE_OTHER): Payer: 59 | Admitting: Family Medicine

## 2016-06-19 VITALS — BP 122/70 | HR 86 | Ht 62.0 in | Wt 159.0 lb

## 2016-06-19 DIAGNOSIS — M542 Cervicalgia: Secondary | ICD-10-CM | POA: Diagnosis not present

## 2016-06-19 DIAGNOSIS — M999 Biomechanical lesion, unspecified: Secondary | ICD-10-CM

## 2016-06-19 NOTE — Patient Instructions (Addendum)
Good to see you  I hope the allergies get better You are doing great do not change a thing  See me again in 6-7 weeks!

## 2016-06-19 NOTE — Assessment & Plan Note (Signed)
Decision today to treat with OMT was based on Physical Exam  After verbal consent patient was treated with HVLA, ME techniques in cervical, thoracic, lumbar and sacral areas  Patient tolerated the procedure well with improvement in symptoms  Patient given exercises, stretches and lifestyle modifications  See medications in patient instructions if given  Patient will follow up in 6-8 weeks                   

## 2016-06-19 NOTE — Assessment & Plan Note (Signed)
Stable at the moment. Encourage patient to continue to be active otherwise. We discussed icing regimen. Patient has some very well and we will space out intervals to 6-8 week period.

## 2016-06-19 NOTE — Progress Notes (Signed)
[name] [address] [place], [place] [address] Phone: [phone] Subjective:     CC: Back pain follow-up Right hip pain  VHQ:[id number]  Melanie [name] is a 56 y.o. female coming in with complaint of    Neck pain Seems to be doing relatively well. Has responded well to osteopathic manipulation previously. Patient denies any radiation of pain down the arms. States that just more tightness. Seems to be worse when her allergies is acting up which at this time are as well.   Patient states that her hips and knees are feeling significantly better with her being in the orthotics.   Past Medical History:  Diagnosis Date  . Allergy   . Asthma   . Osteoporosis    Past Surgical History:  Procedure Laterality Date  . ABDOMINAL HYSTERECTOMY    . APPENDECTOMY    . KNEE SURGERY     Social History  Substance Use Topics  . Smoking status: Never Smoker  . Smokeless tobacco: Never Used  . Alcohol use Yes     Comment: 2/wk   Allergies  Allergen Reactions  . Kiwi Extract Hives, Shortness Of Breath and Itching  . Latex   . Penicillins   . Sulfonamide Derivatives    Family History  Problem Relation Age of Onset  . Heart disease Father 33  . Irregular heart beat Mother   . Stroke Maternal Grandmother   . Heart attack Maternal Grandfather   . Stroke Paternal Grandmother   . Heart attack Paternal Grandfather   . Heart attack Brother 45    Past medical history, social, surgical and family history all reviewed in electronic medical record.   Review of Systems: No headache, visual changes, nausea, vomiting, diarrhea, constipation, dizziness, abdominal pain, skin rash, fevers, chills, night sweats, weight loss, swollen lymph nodes, body aches, joint swelling, muscle aches, chest pain, shortness of breath, mood changes.    Objective  Blood pressure 122/70, pulse 86, height 5\' 2"  (1.575 m), weight 159 lb (72.1 kg).  Systems examined below as of  06/19/16 General: NAD A&O x3 mood, affect normal  HEENT: Pupils equal, extraocular movements intact no nystagmus Respiratory: not short of breath at rest or with speaking Cardiovascular: No lower extremity edema, non tender Skin: Warm dry intact with no signs of infection or rash on extremities or on axial skeleton. Abdomen: Soft nontender, no masses Neuro: Cranial nerves  intact, neurovascularly intact in all extremities with 2+ DTRs and 2+ pulses. Lymph: No lymphadenopathy appreciated today  Gait normal with good balance and coordination.  MSK: Non tender with full range of motion and good stability and symmetric strength and tone of shoulders, elbows, wrist,  knee hips and ankles bilaterally.      Neck: Inspection unremarkable. No palpable stepoffs. Negative Spurling's maneuver. Mild limitation 5 in all planes. Mild tightness from his exam Grip strength and sensation normal in bilateral hands Strength good C4 to T1 distribution No sensory change to C4 to T1 Negative Hoffman sign bilaterally Reflexes normal..  Back Exam:  Inspection: Unremarkable  Motion: Flexion 40 deg, Extension 25 deg, Side Bending to 40 deg bilaterally,  Rotation to 40 deg bilaterally  SLR laying: Negative  XSLR laying: Negative  Nontender on exam FABER:negative  Sensory change: Gross sensation intact to all lumbar and sacral dermatomes.  Reflexes: 2+ at both patellar tendons, 2+ at achilles tendons, Babinski's downgoing.  Strength the legs normal. Leg strength    Osteopathic findings Cervical C2 flexed  rotated and side bent right C4 flexed rotated and side bent left C6 flexed rotated and side bent left T3 extended rotated and side bent right inhaled third rib T9 extended rotated and side bent left L2 flexed rotated and side bent right Sacrum right on right     Impression and Recommendations:     This case required medical decision making of moderate complexity.

## 2016-08-07 ENCOUNTER — Encounter: Payer: Self-pay | Admitting: Family Medicine

## 2016-08-07 ENCOUNTER — Ambulatory Visit (INDEPENDENT_AMBULATORY_CARE_PROVIDER_SITE_OTHER): Payer: 59 | Admitting: Family Medicine

## 2016-08-07 VITALS — BP 118/82 | HR 72 | Ht 61.5 in | Wt 157.0 lb

## 2016-08-07 DIAGNOSIS — G8929 Other chronic pain: Secondary | ICD-10-CM | POA: Diagnosis not present

## 2016-08-07 DIAGNOSIS — M999 Biomechanical lesion, unspecified: Secondary | ICD-10-CM | POA: Diagnosis not present

## 2016-08-07 DIAGNOSIS — M546 Pain in thoracic spine: Secondary | ICD-10-CM | POA: Diagnosis not present

## 2016-08-07 NOTE — Patient Instructions (Signed)
goodto see you  Lets watch the lower back  Ice is your friend.  Happy Birthday! See me again in 4 weeks just in case.

## 2016-08-07 NOTE — Assessment & Plan Note (Signed)
Mild increase in back discomfort recently. Patient is responding well to osteopathic manipulation in the past. Responded again. I do believe that patient's is having somewhat of a flare. We will increase frequency of office visits to 4 week intervals at this time. Patient will continue the natural supplementation. See patient back again in 4 weeks

## 2016-08-07 NOTE — Assessment & Plan Note (Signed)
Decision today to treat with OMT was based on Physical Exam  After verbal consent patient was treated with HVLA, ME, FPR techniques in cervical, thoracic, rib lumbar and sacral areas  Patient tolerated the procedure well with improvement in symptoms  Patient given exercises, stretches and lifestyle modifications  See medications in patient instructions if given  Patient will follow up in 4 weeks 

## 2016-08-07 NOTE — Progress Notes (Signed)
Tawana ScaleZach Smith D.O. [facility] [address] [place], [place] [address] Phone: [phone] Subjective:     CC: Back pain follow-up Right hip pain  BJY:NWGNFAOZHYHPI:Subjective  Melanie [name] is a 56 y.o. female coming in with complaint of   More of lower back pain. Has been some time since she's had this. Patient did write on a bus for longer amount of time and noticed some discomfort. States that no radiation of pain but states that it is affecting even daily activities. Has not been able to workout is much secondary to the pain. Denies any fevers chills or any abnormal weight loss.    Past Medical History:  Diagnosis Date  . Allergy   . Asthma   . Osteoporosis    Past Surgical History:  Procedure Laterality Date  . ABDOMINAL HYSTERECTOMY    . APPENDECTOMY    . KNEE SURGERY     Social History  Substance Use Topics  . Smoking status: Never Smoker  . Smokeless tobacco: Never Used  . Alcohol use Yes     Comment: 2/wk   Allergies  Allergen Reactions  . Kiwi Extract Hives, Shortness Of Breath and Itching  . Latex   . Penicillins   . Sulfonamide Derivatives    Family History  Problem Relation Age of Onset  . Heart disease Father 3639  . Irregular heart beat Mother   . Stroke Maternal Grandmother   . Heart attack Maternal Grandfather   . Stroke Paternal Grandmother   . Heart attack Paternal Grandfather   . Heart attack Brother 45    Past medical history, social, surgical and family history all reviewed in electronic medical record.   Review of Systems: No headache, visual changes, nausea, vomiting, diarrhea, constipation, dizziness, abdominal pain, skin rash, fevers, chills, night sweats, weight loss, swollen lymph nodes, body aches, joint swelling, muscle aches, chest pain, shortness of breath, mood changes.     Objective  Blood pressure 118/82, pulse 72, height 5' 1.5" (1.562 m), weight 157 lb (71.2 kg).  Systems examined below as of 08/07/16 General: NAD A&O x3  mood, affect normal  HEENT: Pupils equal, extraocular movements intact no nystagmus Respiratory: not short of breath at rest or with speaking Cardiovascular: No lower extremity edema, non tender Skin: Warm dry intact with no signs of infection or rash on extremities or on axial skeleton. Abdomen: Soft nontender, no masses Neuro: Cranial nerves  intact, neurovascularly intact in all extremities with 2+ DTRs and 2+ pulses. Lymph: No lymphadenopathy appreciated today  Gait normal with good balance and coordination.  MSK: Non tender with full range of motion and good stability and symmetric strength and tone of shoulders, elbows, wrist,  knee hips and ankles bilaterally.    Neck: Inspection unremarkable. Mild increasing kyphosis of the upper back No palpable stepoffs. Negative Spurling's maneuver. Mild limitation of 5 in extension Grip strength and sensation normal in bilateral hands Strength good C4 to T1 distribution No sensory change to C4 to T1 Negative Hoffman sign bilaterally Reflexes normal  Back Exam:  Inspection: Unremarkable  Motion: Flexion 40 deg, Extension 20 deg, Side Bending to 40 deg bilaterally,  Rotation to 40 deg bilaterally  SLR laying: Negative  XSLR laying: Negative  Tender to palpation in the paraspinal musculature of the lumbar spine mostly L5-S1 on the right side FABER: Positive right Sensory change: Gross sensation intact to all lumbar and sacral dermatomes.  Reflexes: 2+ at both patellar tendons, 2+ at achilles tendons, Babinski's downgoing.  Strength the legs normal. Leg strength    Osteopathic findings  C6 flexed rotated and side bent left T3 extended rotated and side bent right inhaled third rib T5 extended rotated and side bent left L2 flexed rotated and side bent right L5 flexed rotated and side bent right  Sacrum right on right     Impression and Recommendations:     This case required medical decision making of moderate  complexity.

## 2016-09-04 ENCOUNTER — Ambulatory Visit (INDEPENDENT_AMBULATORY_CARE_PROVIDER_SITE_OTHER): Payer: 59 | Admitting: Family Medicine

## 2016-09-04 ENCOUNTER — Encounter: Payer: Self-pay | Admitting: Family Medicine

## 2016-09-04 VITALS — BP 122/84 | HR 73 | Resp 16 | Ht 62.0 in | Wt 160.0 lb

## 2016-09-04 DIAGNOSIS — M542 Cervicalgia: Secondary | ICD-10-CM

## 2016-09-04 DIAGNOSIS — M999 Biomechanical lesion, unspecified: Secondary | ICD-10-CM

## 2016-09-04 NOTE — Patient Instructions (Signed)
Good to see you  Ice is your friend  [email or web address] size medium  See me again in 6 weeks. Bring orthotics at same time.

## 2016-09-04 NOTE — Progress Notes (Signed)
Pre-visit discussion using our clinic review tool. No additional management support is needed unless otherwise documented below in the visit note.  

## 2016-09-04 NOTE — Progress Notes (Signed)
[name] [address] [place], [place] [address] Phone: [phone] Subjective:    I'm seeing this patient by the request  of:  .  CC: Patient is doing relatively well but still has back pain  BJY:NWGNFAOZHY  Melanie [name] is a 56 y.o. female coming in with complaint of back and neck pain. Seems to be doing relatively well with her other aches and pains. Mild ankle pain but nothing severe. Patient still having the muscle spasms intermittently. Patient is dancing on a much more regular basis. Patient states nothing seems to be new problem chest and forcefully some worsening of previous problem.     Past Medical History:  Diagnosis Date  . Allergy   . Asthma   . Osteoporosis    Past Surgical History:  Procedure Laterality Date  . ABDOMINAL HYSTERECTOMY    . APPENDECTOMY    . KNEE SURGERY     Social History   Social History  . Marital status: Divorced    Spouse name: N/A  . Number of children: N/A  . Years of education: N/A   Social History Main Topics  . Smoking status: Never Smoker  . Smokeless tobacco: Never Used  . Alcohol use Yes     Comment: 2/wk  . Drug use: No  . Sexual activity: Not Asked   Other Topics Concern  . None   Social History Narrative   Exercises regularly   Allergies  Allergen Reactions  . Kiwi Extract Hives, Shortness Of Breath and Itching  . Latex   . Penicillins   . Sulfonamide Derivatives    Family History  Problem Relation Age of Onset  . Heart disease Father 29  . Irregular heart beat Mother   . Stroke Maternal Grandmother   . Heart attack Maternal Grandfather   . Stroke Paternal Grandmother   . Heart attack Paternal Grandfather   . Heart attack Brother 45    Past medical history, social, surgical and family history all reviewed in electronic medical record.  No pertanent information unless stated regarding to the chief complaint.   Review of Systems:Review of systems updated and as  accurate as of 09/04/16  No headache, visual changes, nausea, vomiting, diarrhea, constipation, dizziness, abdominal pain, skin rash, fevers, chills, night sweats, weight loss, swollen lymph nodes chest pain, shortness of breath, mood changes.  Positive body aches and muscle aches  Objective  Blood pressure 122/84, pulse 73, resp. rate 16, height 5\' 2"  (1.575 m), weight 160 lb (72.6 kg), SpO2 98 %. Systems examined below as of 09/04/16   General: No apparent distress alert and oriented x3 mood and affect normal, dressed appropriately.  HEENT: Pupils equal, extraocular movements intact  Respiratory: Patient's speak in full sentences and does not appear short of breath  Cardiovascular: No lower extremity edema, non tender, no erythema  Skin: Warm dry intact with no signs of infection or rash on extremities or on axial skeleton.  Abdomen: Soft nontender  Neuro: Cranial nerves II through XII are intact, neurovascularly intact in all extremities with 2+ DTRs and 2+ pulses.  Lymph: No lymphadenopathy of posterior or anterior cervical chain or axillae bilaterally.  Gait normal with good balance and coordination.  MSK:  Non tender with full range of motion and good stability and symmetric strength and tone of shoulders, elbows, wrist, hip, knee and ankles bilaterally.  Neck: Inspection unremarkable. No palpable stepoffs. Negative Spurling's maneuver. Full neck range of motion Grip strength and sensation normal  in bilateral hands Strength good C4 to T1 distribution No sensory change to C4 to T1 Negative Hoffman sign bilaterally Reflexes normal Mild spasm in the right trapezius as well as in the left trapezius.  Osteopathic findings Cervical C2 flexed rotated and side bent right C4 flexed rotated and side bent left C6 flexed rotated and side bent left T3 extended rotated and side bent right inhaled third rib T9 extended rotated and side bent left L2 flexed rotated and side bent  right Sacrum right on right    Impression and Recommendations:     This case required medical decision making of moderate complexity.      Note: This dictation was prepared with Dragon dictation along with smaller phrase technology. Any transcriptional errors that result from this process are unintentional.

## 2016-09-04 NOTE — Assessment & Plan Note (Signed)
Decision today to treat with OMT was based on Physical Exam  After verbal consent patient was treated with HVLA, ME, FPR techniques in cervical, thoracic, lumbar and sacral areas  Patient tolerated the procedure well with improvement in symptoms  Patient given exercises, stretches and lifestyle modifications  See medications in patient instructions if given  Patient will follow up in 4-6 weeks 

## 2016-09-04 NOTE — Assessment & Plan Note (Signed)
Patient seems to be doing fairly well or condyle. We discussed with patient again about posture. Patient is not working out as regularly. We discussed other changes she can do throughout the day that could be beneficial. We discussed also shoe choices. Follow-up again with me 6 weeks

## 2016-10-04 DIAGNOSIS — H524 Presbyopia: Secondary | ICD-10-CM | POA: Diagnosis not present

## 2016-10-16 ENCOUNTER — Encounter: Payer: Self-pay | Admitting: Family Medicine

## 2016-10-16 ENCOUNTER — Ambulatory Visit (INDEPENDENT_AMBULATORY_CARE_PROVIDER_SITE_OTHER): Payer: 59 | Admitting: Family Medicine

## 2016-10-16 VITALS — BP 128/72 | HR 81 | Resp 16 | Wt 155.0 lb

## 2016-10-16 DIAGNOSIS — M542 Cervicalgia: Secondary | ICD-10-CM | POA: Diagnosis not present

## 2016-10-16 DIAGNOSIS — M999 Biomechanical lesion, unspecified: Secondary | ICD-10-CM | POA: Diagnosis not present

## 2016-10-16 NOTE — Assessment & Plan Note (Signed)
Decision today to treat with OMT was based on Physical Exam  After verbal consent patient was treated with HVLA, ME, FPR techniques in cervical, thoracic, lumbar and sacral areas  Patient tolerated the procedure well with improvement in symptoms  Patient given exercises, stretches and lifestyle modifications  See medications in patient instructions if given  Patient will follow up in 6-7 weeks 

## 2016-10-16 NOTE — Progress Notes (Signed)
[name] [address] [place], [place] [address] Phone: [phone] Subjective:    I'm seeing this patient by the request  of:  .  CC: Patient is doing relatively well but still has back pain  WGN:[id number]  Melanie [name] is a 56 y.o. female coming in with complaint of back and neck pain. Seems to be doing relatively well with her other aches and pains. Patient is changed her diet recently and decreased cartilage which is a ladder to lose some weight. Also feels like she is having less inflammation. Some mild increase in allergies which discuss some mild neck and upper back pain. Patient continues to be fairly active. Denies any new symptoms just some worsening of the tightness in her neck and back.    Past Medical History:  Diagnosis Date  . Allergy   . Asthma   . Osteoporosis    Past Surgical History:  Procedure Laterality Date  . ABDOMINAL HYSTERECTOMY    . APPENDECTOMY    . KNEE SURGERY     Social History   Social History  . Marital status: Divorced    Spouse name: N/A  . Number of children: N/A  . Years of education: N/A   Social History Main Topics  . Smoking status: Never Smoker  . Smokeless tobacco: Never Used  . Alcohol use Yes     Comment: 2/wk  . Drug use: No  . Sexual activity: Not Asked   Other Topics Concern  . None   Social History Narrative   Exercises regularly   Allergies  Allergen Reactions  . Kiwi Extract Hives, Shortness Of Breath and Itching  . Latex   . Penicillins   . Sulfonamide Derivatives    Family History  Problem Relation Age of Onset  . Heart disease Father 71  . Irregular heart beat Mother   . Stroke Maternal Grandmother   . Heart attack Maternal Grandfather   . Stroke Paternal Grandmother   . Heart attack Paternal Grandfather   . Heart attack Brother 45    Past medical history, social, surgical and family history all reviewed in electronic medical record.  No pertanent information  unless stated regarding to the chief complaint.   Review of Systems: No headache, visual changes, nausea, vomiting, diarrhea, constipation, dizziness, abdominal pain, skin rash, fevers, chills, night sweats, weight loss, swollen lymph nodes,chest pain, shortness of breath, mood changes.  Positive body aches and muscle aches  Objective  Blood pressure 128/72, pulse 81, resp. rate 16, weight 155 lb (70.3 kg), SpO2 98 %.   Systems examined below as of 10/16/16 General: NAD A&O x3 mood, affect normal  HEENT: Pupils equal, extraocular movements intact no nystagmus Respiratory: not short of breath at rest or with speaking Cardiovascular: No lower extremity edema, non tender Skin: Warm dry intact with no signs of infection or rash on extremities or on axial skeleton. Abdomen: Soft nontender, no masses Neuro: Cranial nerves  intact, neurovascularly intact in all extremities with 2+ DTRs and 2+ pulses. Lymph: No lymphadenopathy appreciated today  Gait normal with good balance and coordination.  MSK: Non tender with full range of motion and good stability and symmetric strength and tone of shoulders, elbows, wrist,  knee hips and ankles bilaterally.   Neck: Inspection unremarkable. No palpable stepoffs. Negative Spurling's maneuver. Mild decrease in range of motion lacking the last 5 of extension. Grip strength and sensation normal in bilateral hands Strength good C4 to T1 distribution No sensory  change to C4 to T1 Negative Hoffman sign bilaterally Reflexes normal  Patient also is having some mild increase in back tenderness in the paraspinal musculature of the L5-S1 bilaterally. Mild positive Pearlean BrownieFaber on the right side  Osteopathic findings C2 flexed rotated and side bent left with C6 flexed rotated and side bent left T3 extended rotated and side bent right inhaled third rib T5 extended rotated and side bent left L1 flexed rotated and side bent right L5 flexed rotated and side bent  left Sacrum right on right     Impression and Recommendations:     This case required medical decision making of moderate complexity.      Note: This dictation was prepared with Dragon dictation along with smaller phrase technology. Any transcriptional errors that result from this process are unintentional.

## 2016-10-16 NOTE — Patient Instructions (Signed)
Good to see you  You are doing great  2 tennis ball in a tube sock and put them where head meets the neck.  Keep doing everything else Congrats on the weight loss.  See me again in 6-7 weeks

## 2016-10-16 NOTE — Assessment & Plan Note (Signed)
Continues to be chronic. Patient does better when she is active. Encourage her to continue to stay active. We discussed icing regimen and home exercises. Encourage posture and ergonomics throughout the day. Responds well to osteopathic manipulation was being again in 6-7 weeks.

## 2016-11-09 ENCOUNTER — Telehealth: Payer: 59 | Admitting: Family

## 2016-11-09 DIAGNOSIS — M5440 Lumbago with sciatica, unspecified side: Secondary | ICD-10-CM | POA: Diagnosis not present

## 2016-11-09 MED ORDER — BACLOFEN 10 MG PO TABS: 10.0000 mg | ORAL_TABLET | Freq: Three times a day (TID) | ORAL | 0 refills | Status: DC

## 2016-11-09 MED ORDER — ETODOLAC 300 MG PO CAPS: 300.0000 mg | ORAL_CAPSULE | Freq: Two times a day (BID) | ORAL | 0 refills | Status: DC

## 2016-11-09 NOTE — Progress Notes (Signed)
We are sorry that you are not feeling well.  Here is how we plan to help!  Based on what you have shared with me it looks like you mostly have acute back pain.  Acute back pain is defined as musculoskeletal pain that can resolve in 1-3 weeks with conservative treatment.  I have prescribed Etodolac 300 mg twice a day non-steroid anti-inflammatory (NSAID) as well as Baclofen 10 mg every eight hours as needed which is a muscle relaxer  Some patients experience stomach irritation or in increased heartburn with anti-inflammatory drugs.  Please keep in mind that muscle relaxer's can cause fatigue and should not be taken while at work or driving.  Back pain is very common.  The pain often gets better over time.  The cause of back pain is usually not dangerous.  Most people can learn to manage their back pain on their own.  We can not prescribe controlled medications through an Evisit.   Home Care  Stay active.  Start with short walks on flat ground if you can.  Try to walk farther each day.  Do not sit, drive or stand in one place for more than 30 minutes.  Do not stay in bed.  Do not avoid exercise or work.  Activity can help your back heal faster.  Be careful when you bend or lift an object.  Bend at your knees, keep the object close to you, and do not twist.  Sleep on a firm mattress.  Lie on your side, and bend your knees.  If you lie on your back, put a pillow under your knees.  Only take medicines as told by your doctor.  Put ice on the injured area.  Put ice in a plastic bag  Place a towel between your skin and the bag  Leave the ice on for 15-20 minutes, 3-4 times a day for the first 2-3 days. 210 After that, you can switch between ice and heat packs.  Ask your doctor about back exercises or massage.  Avoid feeling anxious or stressed.  Find good ways to deal with stress, such as exercise.  Get Help Right Way If:  Your pain does not go away with rest or medicine.  Your pain  does not go away in 1 week.  You have new problems.  You do not feel well.  The pain spreads into your legs.  You cannot control when you poop (bowel movement) or pee (urinate)  You feel sick to your stomach (nauseous) or throw up (vomit)  You have belly (abdominal) pain.  You feel like you may pass out (faint).  If you develop a fever.  Make Sure you:  Understand these instructions.  Will watch your condition  Will get help right away if you are not doing well or get worse.  Your e-visit answers were reviewed by a board certified advanced clinical practitioner to complete your personal care plan.  Depending on the condition, your plan could have included both over the counter or prescription medications.  If there is a problem please reply  once you have received a response from your provider.  Your safety is important to us.  If you have drug allergies check your prescription carefully.    You can use MyChart to ask questions about today's visit, request a non-urgent call back, or ask for a work or school excuse for 24 hours related to this e-Visit. If it has been greater than 24 hours you will need to follow  up with your provider, or enter a new e-Visit to address those concerns.  You will get an e-mail in the next two days asking about your experience.  I hope that your e-visit has been valuable and will speed your recovery. Thank you for using e-visits.

## 2016-11-12 ENCOUNTER — Ambulatory Visit (INDEPENDENT_AMBULATORY_CARE_PROVIDER_SITE_OTHER): Payer: 59 | Admitting: Family Medicine

## 2016-11-12 ENCOUNTER — Encounter: Payer: Self-pay | Admitting: Family Medicine

## 2016-11-12 DIAGNOSIS — M5442 Lumbago with sciatica, left side: Secondary | ICD-10-CM

## 2016-11-14 NOTE — Progress Notes (Signed)
  Melanie ConroyMilicent D [name] - 56 y.o. female MRN [id number]  Date of birth: [birth year]  SUBJECTIVE:  Including CC & ROS.   Melanie [name] is a 56 yo F that is presenting with low back pain. She does report intermittent radicular symptoms down her left leg. The pains started on Saturday when she was doing some spring cleaning. She first had her pain occur after she fell through a chair. She has been using ice and heat. She called the evisit and was prescribed an NSAID but hasn't picked it up yet. She doesn't tolerate NSAIDS or muscle relaxer's that well. She denies any saddler anesthesia or bowel or bladder incontinence. She usually gets an adjustment and that helps her pain.   ROS: No unexpected weight loss, fever, chills, swelling, instability, numbness/tingling, redness, otherwise see HPI   HISTORY: Past Medical, Surgical, Social, and Family History Reviewed & Updated per EMR.   Pertinent Historical Findings include: PMHx: asthma Surgical:   R ACL surgery  Social:  Occasional alcohol use  FHx: heart disease   DATA REVIEWED: none  PHYSICAL EXAM:  VS: BP 116/70   Ht 5\' 2"  (1.575 m)   Wt 153 lb (69.4 kg)   BMI 27.98 kg/m  PHYSICAL EXAM: Gen: NAD, alert, cooperative with exam, well-appearing HEENT: clear conjunctiva, EOMI CV:  no edema, capillary refill brisk,  Resp: non-labored, normal speech Skin: no rashes, normal turgor  Neuro: no gross deficits.  Psych:  alert and oriented MSK:  Back:  Some TTP of the paraspinal muscles on the left, GT and SI joints  Limited HIP internal rotation on the left compared to the right  Limited hip ER compared to the right No leg length discrepancy  Normal hip flexion strength  Normal strength in knee flexion and extension  Normal SLR b/l  Normal strength with plantar and dorsalflexion  DTR's are symmetric  Normal gait  Tightness with FABER on the left compared to right  Neurovascularly intact   ASSESSMENT & PLAN:   Backache Acute on chronic of her low  back pain. Intermittent sciatic type symptoms. Significantly tighter in her left hip and SI joint compared to right.  - decided to try NSAID and can take it with prilosec OTC  - if pain doesn't improved then would consider a hip x-ray and SI joint xray: Could consider a left SI joint injection

## 2016-11-14 NOTE — Assessment & Plan Note (Signed)
Acute on chronic of her low back pain. Intermittent sciatic type symptoms. Significantly tighter in her left hip and SI joint compared to right.  - decided to try NSAID and can take it with prilosec OTC  - if pain doesn't improved then would consider a hip x-ray and SI joint xray: Could consider a left SI joint injection

## 2016-11-27 ENCOUNTER — Ambulatory Visit (INDEPENDENT_AMBULATORY_CARE_PROVIDER_SITE_OTHER): Payer: 59 | Admitting: Family Medicine

## 2016-11-27 ENCOUNTER — Encounter: Payer: Self-pay | Admitting: Family Medicine

## 2016-11-27 VITALS — BP 126/84 | HR 83 | Wt 155.0 lb

## 2016-11-27 DIAGNOSIS — M999 Biomechanical lesion, unspecified: Secondary | ICD-10-CM | POA: Diagnosis not present

## 2016-11-27 DIAGNOSIS — M2550 Pain in unspecified joint: Secondary | ICD-10-CM | POA: Diagnosis not present

## 2016-11-27 NOTE — Assessment & Plan Note (Signed)
Stable oral. Patient taken back pain seems to be doing rather well. Patient does have different medication for any type of breakthrough pain. We discussed the icing regimen. Has responded very well to osteopathic manipulation. Encourage her to decrease any type of oral anti-inflammatories with patient having difficulty with stomach issues. Patient will follow-up again in 4-6 weeks

## 2016-11-27 NOTE — Progress Notes (Signed)
Tawana ScaleZach Smith D.O. [facility] [address] [place], [place] [address] Phone: [phone] Subjective:    I'm seeing this patient by the request  of:  .  CC: Patient is doing relatively well but still has back pain  FAO:[id number]:Subjective  Melanie [name] is a 56 y.o. female coming in with complaint of back and neck pain. Patient has been seen multiple times for this previously. Has responded well for osteopathic manipulation. Started working with a Proofreadernew trainer and is having more muscle soreness. Denies though any radicular symptoms. Just more soreness and tightness. Patient did have some increasing back pain and muscle spasms 2 weeks ago and side of the provider. Patient states that the muscle relaxer made her tired but did not help. Seems that only time did make it finally resolved.    Past Medical History:  Diagnosis Date  . Allergy   . Asthma   . Osteoporosis    Past Surgical History:  Procedure Laterality Date  . ABDOMINAL HYSTERECTOMY    . APPENDECTOMY    . KNEE SURGERY     Social History   Social History  . Marital status: Divorced    Spouse name: N/A  . Number of children: N/A  . Years of education: N/A   Social History Main Topics  . Smoking status: Never Smoker  . Smokeless tobacco: Never Used  . Alcohol use Yes     Comment: 2/wk  . Drug use: No  . Sexual activity: Not on file   Other Topics Concern  . Not on file   Social History Narrative   Exercises regularly   Allergies  Allergen Reactions  . Kiwi Extract Hives, Shortness Of Breath and Itching  . Latex   . Penicillins   . Sulfonamide Derivatives    Family History  Problem Relation Age of Onset  . Heart disease Father [id number]  . Irregular heart beat Mother   . Stroke Maternal Grandmother   . Heart attack Maternal Grandfather   . Stroke Paternal Grandmother   . Heart attack Paternal Grandfather   . Heart attack Brother 45    Past medical history, social, surgical and family history all  reviewed in electronic medical record.  No pertanent information unless stated regarding to the chief complaint.   Review of Systems: No headache, visual changes, nausea, vomiting, diarrhea, constipation, dizziness, abdominal pain, skin rash, fevers, chills, night sweats, weight loss, swollen lymph nodes, body aches, joint swelling,  chest pain, shortness of breath, mood changes. Positive muscle soreness   Objective  Blood pressure 126/84, pulse 83, weight 155 lb (70.3 kg), SpO2 96 %.   Systems examined below as of 11/27/16 General: NAD A&O x3 mood, affect normal  HEENT: Pupils equal, extraocular movements intact no nystagmus Respiratory: not short of breath at rest or with speaking Cardiovascular: No lower extremity edema, non tender Skin: Warm dry intact with no signs of infection or rash on extremities or on axial skeleton. Abdomen: Soft nontender, no masses Neuro: Cranial nerves  intact, neurovascularly intact in all extremities with 2+ DTRs and 2+ pulses. Lymph: No lymphadenopathy appreciated today  Gait normal with good balance and coordination.  MSK: Non tender with full range of motion and good stability and symmetric strength and tone of shoulders, elbows, wrist,  knee hips and ankles bilaterally.   Neck: Inspection unremarkable. No palpable stepoffs. Negative Spurling's maneuver. Full neck range of motion Grip strength and sensation normal in bilateral hands Strength good C4 to T1 distribution No  sensory change to C4 to T1 Negative Hoffman sign bilaterally Reflexes normal  Patient's back exam-show increasing tenderness in the lumbar spine. Seems to be mostly in the thoracolumbar juncture as well as some in the lumbosacral juncture. Negative straight leg test. Full strength of the lower extremities.  Osteopathic findings C2 flexed rotated and side bent right C4 flexed rotated and side bent left C6 flexed rotated and side bent left T3 extended rotated and side bent right  inhaled third rib T9 extended rotated and side bent left L4 flexed rotated and side bent right Sacrum right on right      Impression and Recommendations:     This case required medical decision making of moderate complexity.      Note: This dictation was prepared with Dragon dictation along with smaller phrase technology. Any transcriptional errors that result from this process are unintentional.

## 2016-11-27 NOTE — Assessment & Plan Note (Signed)
Decision today to treat with OMT was based on Physical Exam  After verbal consent patient was treated with HVLA, ME, FPR techniques in cervical, thoracic, lumbar and sacral areas  Patient tolerated the procedure well with improvement in symptoms  Patient given exercises, stretches and lifestyle modifications  See medications in patient instructions if given  Patient will follow up in 4-6 weeks 

## 2016-11-27 NOTE — Patient Instructions (Signed)
God to see you  I am excited for your new trainer.  You will do great  Consider arginene daily to help with muscle soreness.  I think I would see you again in 4 weeks just in case.  Keep it up!!!

## 2016-12-25 ENCOUNTER — Ambulatory Visit: Payer: 59 | Admitting: Family Medicine

## 2017-01-06 ENCOUNTER — Encounter: Payer: Self-pay | Admitting: Family Medicine

## 2017-01-06 ENCOUNTER — Ambulatory Visit (INDEPENDENT_AMBULATORY_CARE_PROVIDER_SITE_OTHER): Payer: 59 | Admitting: Family Medicine

## 2017-01-06 VITALS — BP 116/80 | HR 80 | Ht 62.0 in | Wt 159.0 lb

## 2017-01-06 DIAGNOSIS — M999 Biomechanical lesion, unspecified: Secondary | ICD-10-CM | POA: Diagnosis not present

## 2017-01-06 DIAGNOSIS — M5442 Lumbago with sciatica, left side: Secondary | ICD-10-CM

## 2017-01-06 NOTE — Assessment & Plan Note (Signed)
Decision today to treat with OMT was based on Physical Exam  After verbal consent patient was treated with HVLA, ME, FPR techniques in cervical, thoracic, lumbar and sacral areas  Patient tolerated the procedure well with improvement in symptoms  Patient given exercises, stretches and lifestyle modifications  See medications in patient instructions if given  Patient will follow up in 6-8 weeks 

## 2017-01-06 NOTE — Patient Instructions (Signed)
God to see you   Iron 2-3 times a week with the vitamin C I will call you if we get samples.   Lets see you again in 6 weeks

## 2017-01-06 NOTE — Progress Notes (Signed)
Tawana ScaleZach George Alcantar D.O. [facility] [address] [place], [place] [address] Phone: [phone] Subjective:     CC: Neck pain and neck pain follow-up  NGE:[id number]:Subjective  Melanie [name] is a 56 y.o. female coming in with complaint of back and neck pain. P patient has been some back pain and neck pain over the course of several months. Patient has responded very well to conservative therapy including home exercises. Working with a Proofreadernew trainer and notices it may be making some improvement. Patient is monitoring her diet. Started taking iron supplementation and feels like that has helped with some of her energy. Still aching sensation. Has noticed some tightness in the neck recently.    Past Medical History:  Diagnosis Date  . Allergy   . Asthma   . Osteoporosis    Past Surgical History:  Procedure Laterality Date  . ABDOMINAL HYSTERECTOMY    . APPENDECTOMY    . KNEE SURGERY     Social History   Social History  . Marital status: Divorced    Spouse name: N/A  . Number of children: N/A  . Years of education: N/A   Social History Main Topics  . Smoking status: Never Smoker  . Smokeless tobacco: Never Used  . Alcohol use Yes     Comment: 2/wk  . Drug use: No  . Sexual activity: Not Asked   Other Topics Concern  . None   Social History Narrative   Exercises regularly   Allergies  Allergen Reactions  . Kiwi Extract Hives, Shortness Of Breath and Itching  . Latex   . Penicillins   . Sulfonamide Derivatives    Family History  Problem Relation Age of Onset  . Heart disease Father 139  . Irregular heart beat Mother   . Stroke Maternal Grandmother   . Heart attack Maternal Grandfather   . Stroke Paternal Grandmother   . Heart attack Paternal Grandfather   . Heart attack Brother 45    Past medical history, social, surgical and family history all reviewed in electronic medical record.  No pertanent information unless stated regarding to the chief complaint.    Review of Systems: No headache, visual changes, nausea, vomiting, diarrhea, constipation, dizziness, abdominal pain, skin rash, fevers, chills, night sweats, weight loss, swollen lymph nodes, body aches, joint swelling, chest pain, shortness of breath, mood changes.  Mild positive muscle aches   Objective  Blood pressure 116/80, pulse 80, height 5\' 2"  (1.575 m), weight 159 lb (72.1 kg), SpO2 99 %.   Systems examined below as of 01/06/17 General: NAD A&O x3 mood, affect normal  HEENT: Pupils equal, extraocular movements intact no nystagmus Respiratory: not short of breath at rest or with speaking Cardiovascular: No lower extremity edema, non tender Skin: Warm dry intact with no signs of infection or rash on extremities or on axial skeleton. Abdomen: Soft nontender, no masses Neuro: Cranial nerves  intact, neurovascularly intact in all extremities with 2+ DTRs and 2+ pulses. Lymph: No lymphadenopathy appreciated today  Gait normal with good balance and coordination.  MSK: Non tender with full range of motion and good stability and symmetric strength and tone of shoulders, elbows, wrist,  knee hips and ankles bilaterally.   Neck: Inspection unremarkable. No palpable stepoffs. Negative Spurling's maneuver. Mild limitation lacking the last 5 of extension Grip strength and sensation normal in bilateral hands Strength good C4 to T1 distribution No sensory change to C4 to T1 Negative Hoffman sign bilaterally Reflexes normal   Osteopathic  findings C4 flexed rotated and side bent left C7 flexed rotated and side bent left T3 extended rotated and side bent right inhaled third rib T7 extended rotated and side bent left L3 flexed rotated and side bent right Sacrum right on right       Impression and Recommendations:     This case required medical decision making of moderate complexity.      Note: This dictation was prepared with Dragon dictation along with smaller phrase  technology. Any transcriptional errors that result from this process are unintentional.

## 2017-01-06 NOTE — Assessment & Plan Note (Signed)
Stable. Patient hasn't piriformis another problem previously. Do not feel that further imaging is needed. We discussed icing regimen. We discussed objective recently do a which ones to avoid. Patient is in custom orthotics made been beneficial and is working with a Proofreadernew trainer. Patient does respond well to osteopathic manipulation and will follow-up with me again in 6-8 weeks

## 2017-01-08 MED FILL — QVAR REDIHALER 40 MCG/ACT A: 40 | 60 days supply | Qty: 11 | Fill #0

## 2017-01-31 ENCOUNTER — Ambulatory Visit (INDEPENDENT_AMBULATORY_CARE_PROVIDER_SITE_OTHER): Payer: 59 | Admitting: Physician Assistant

## 2017-01-31 ENCOUNTER — Encounter: Payer: Self-pay | Admitting: Physician Assistant

## 2017-01-31 VITALS — BP 116/77 | HR 94 | Temp 98.2°F | Resp 16 | Ht 62.0 in | Wt 156.4 lb

## 2017-01-31 DIAGNOSIS — R079 Chest pain, unspecified: Secondary | ICD-10-CM | POA: Diagnosis not present

## 2017-01-31 NOTE — Patient Instructions (Addendum)
You are able to ice the area three times per day for 15 minutes You may also want to use ibuprofen for pain.  600mg  every 6-8 hours.  No more than 1 week at a time.  Take with food. This appears very musculoskeletal.  Chest Wall Pain Chest wall pain is pain in or around the bones and muscles of your chest. Sometimes, an injury causes this pain. Sometimes, the cause may not be known. This pain may take several weeks or longer to get better. Follow these instructions at home: Pay attention to any changes in your symptoms. Take these actions to help with your pain:  Rest as told by your doctor.  Avoid activities that cause pain. Try not to use your chest, belly (abdominal), or side muscles to lift heavy things.  If directed, apply ice to the painful area: ? Put ice in a plastic bag. ? Place a towel between your skin and the bag. ? Leave the ice on for 20 minutes, 2-3 times per day.  Take over-the-counter and prescription medicines only as told by your doctor.  Do not use tobacco products, including cigarettes, chewing tobacco, and e-cigarettes. If you need help quitting, ask your doctor.  Keep all follow-up visits as told by your doctor. This is important.  Contact a doctor if:  You have a fever.  Your chest pain gets worse.  You have new symptoms. Get help right away if:  You feel sick to your stomach (nauseous) or you throw up (vomit).  You feel sweaty or light-headed.  You have a cough with phlegm (sputum) or you cough up blood.  You are short of breath. This information is not intended to replace advice given to you by your health care provider. Make sure you discuss any questions you have with your health care provider. Document Released: 11/12/2007 Document Revised: 11/01/2015 Document Reviewed: 08/21/2014 Elsevier Interactive Patient Education  2018 [employer].       IF you received an x-ray today, you will receive an invoice from [facility]. Please  contact [facility] at [phone] with questions or concerns regarding your invoice.   IF you received labwork today, you will receive an invoice from [facility]. Please contact [facility] at [phone] with questions or concerns regarding your invoice.   Our billing staff will not be able to assist you with questions regarding bills from these companies.  You will be contacted with the lab results as soon as they are available. The fastest way to get your results is to activate your My Chart account. Instructions are located on the last page of this paperwork. If you have not heard from [place] regarding the results in 2 weeks, please contact this office.

## 2017-01-31 NOTE — Progress Notes (Signed)
PRIMARY CARE AT [facility] [address] [place] [address] [phone]  Date:  01/31/2017   Name:  Melanie [name]   DOB:  October 07, 1960   MRN:  [id number]  PCP:  Pincus Sanes, MD    History of Present Illness:  Melanie [name] is a 56 y.o. female patient who presents to PCP with  Chief Complaint  Patient presents with  . Chest Pain    hurts to move, muscle strain, sharp pain when coughing     Yesterday, she has pain at the center of her chest pain described as pulling and in between the shoulder blades.  Pain not associated with exertions.  No palpitations.   No sob or dyspnea.  No coughing.  No reflux symptoms. No dizziness, jaw pain, or nausea associated with the pain.  Pain with movement coughing, movement.  3 days ago, she was lifting weights--performing overhead presses, pull-ins, with her trainer.  This is not a routine exercise for patient. Family hx of heart attack.  Brother 20 years old MI, father 71 MI, sister with valvular problem Primary care is [facility] who is knowledgeable of her family hx, she reports. Patient Active Problem List   Diagnosis Date Noted  . Polyarthralgia 08/23/2015  . Asthma with acute exacerbation 02/28/2015  . Quadriceps tendinitis 11/16/2014  . Neck pain 07/07/2014  . Concussion with no loss of consciousness 07/07/2014  . Nonallopathic lesion of lumbosacral region 11/04/2013  . Nonallopathic lesion of thoracic region 11/04/2013  . Piriformis syndrome of left side 10/14/2013  . Greater trochanteric bursitis of left hip 10/14/2013  . Trapezius muscle spasm 11/26/2012  . Nonallopathic lesion of cervicothoracic region 11/26/2012  . Seasonal allergies 09/28/2012  . Asthma attack 12/01/2011  . Backache 09/26/2009  . OTHER ACQUIRED DEFORMITY OF ANKLE AND FOOT OTHER 09/26/2009  . ABNORMALITY OF GAIT 09/26/2009  . KNEE PAIN, RIGHT 08/08/2009  . TENDINITIS, PATELLAR 08/08/2009    Past Medical History:  Diagnosis Date  . Allergy   . Asthma   .  Osteoporosis     Past Surgical History:  Procedure Laterality Date  . ABDOMINAL HYSTERECTOMY    . APPENDECTOMY    . KNEE SURGERY      Social History  Substance Use Topics  . Smoking status: Never Smoker  . Smokeless tobacco: Never Used  . Alcohol use Yes     Comment: 2/wk    Family History  Problem Relation Age of Onset  . Heart disease Father 45  . Irregular heart beat Mother   . Stroke Maternal Grandmother   . Heart attack Maternal Grandfather   . Stroke Paternal Grandmother   . Heart attack Paternal Grandfather   . Heart attack Brother 45    Allergies  Allergen Reactions  . Kiwi Extract Hives, Shortness Of Breath and Itching  . Latex   . Penicillins   . Sulfonamide Derivatives     Medication list has been reviewed and updated.  Current Outpatient Prescriptions on File Prior to Visit  Medication Sig Dispense Refill  . albuterol (PROVENTIL HFA;VENTOLIN HFA) 108 (90 Base) MCG/ACT inhaler Inhale 2 puffs into the lungs every 4 (four) hours as needed for wheezing or shortness of breath (cough, shortness of breath or wheezing.). 1 Inhaler 1  . azelastine (ASTELIN) 0.1 % nasal spray Place 1 spray into both nostrils 2 (two) times daily. 30 mL 12  . baclofen (LIORESAL) 10 MG tablet Take 1 tablet (10 mg total) by mouth 3 (three) times daily. 30 each  0  . beclomethasone (QVAR) 40 MCG/ACT inhaler Inhale 1 puff into the lungs 2 (two) times daily. Yearly physical due in April must see MD for refills 1 Inhaler 3  . cholecalciferol (VITAMIN D) 1000 units tablet Take 2,000 Units by mouth daily.    [name] etodolac (LODINE) 300 MG capsule Take 1 capsule (300 mg total) by mouth 2 (two) times daily. 60 capsule 0  . Flaxseed, Linseed, (FLAX SEED OIL) 1000 MG CAPS Take 1,000 mg by mouth.    [name] ipratropium (ATROVENT) 0.03 % nasal spray Place 2 sprays into both nostrils every 12 (twelve) hours. 30 mL 12  . loratadine (CLARITIN) 10 MG tablet Take 10 mg by mouth daily.    . Multiple Vitamin  (MULTIVITAMIN) tablet Take 1 tablet by mouth daily.    . Probiotic Product (PROBIOTIC DAILY PO) Take by mouth.    . rizatriptan (MAXALT-MLT) 10 MG disintegrating tablet Take 1 tablet (10 mg total) by mouth as needed for migraine. May repeat in 2 hours if needed 10 tablet 1   Current Facility-Administered Medications on File Prior to Visit  Medication Dose Route Frequency Provider Last Rate Last Dose  . ipratropium-albuterol (DUONEB) 0.5-2.5 (3) MG/3ML nebulizer solution 3 mL  3 mL Nebulization Once Nche, Bonna Gains, NP      . methylPREDNISolone acetate (DEPO-MEDROL) injection 80 mg  80 mg Intramuscular Once Nche, Bonna Gains, NP        ROS ROS otherwise unremarkable unless listed above.  Physical Examination: BP 116/77   Pulse 94   Temp 98.2 F (36.8 C) (Oral)   Resp 16   Ht 5\' 2"  (1.575 m)   Wt 156 lb 6.4 oz (70.9 kg)   SpO2 100%   BMI 28.61 kg/m  Ideal Body Weight: Weight in (lb) to have BMI = 25: 136.4  Physical Exam  Constitutional: She is oriented to person, place, and time. She appears well-developed and well-nourished. No distress.  HENT:  Head: Normocephalic and atraumatic.  Right Ear: External ear normal.  Left Ear: External ear normal.  Eyes: Pupils are equal, round, and reactive to light. Conjunctivae and EOM are normal.  Cardiovascular: Normal rate.   Pulmonary/Chest: Effort normal. No respiratory distress. She has no decreased breath sounds. She has no wheezes. She has no rhonchi.  Chest wall tenderness at center of her sternum and bilateral adjacent musculature without crepitus.  Neurological: She is alert and oriented to person, place, and time.  Skin: She is not diaphoretic.  Psychiatric: She has a normal mood and affect. Her behavior is normal.   Ekg: appears normal-- (reviewed as well by Dr. Clelia Croft)   Assessment and Plan: [name] is a 56 y.o. female who is here today for chest pain. Appears msk.  Advised icing and anti-inflammatory. Follow  up advised with alarming sxs and progressive worsening discussed. Chest pain, unspecified type - Plan: EKG 12-Lead  Trena Platt, PA-C [facility] 8/26/20189:11 PM

## 2017-02-03 ENCOUNTER — Encounter: Payer: Self-pay | Admitting: Family Medicine

## 2017-02-03 ENCOUNTER — Ambulatory Visit (INDEPENDENT_AMBULATORY_CARE_PROVIDER_SITE_OTHER)
Admission: RE | Admit: 2017-02-03 | Discharge: 2017-02-03 | Disposition: A | Discharge status: Home / Self Care | Payer: 59 | Source: Ambulatory Visit | Attending: Family Medicine | Admitting: Family Medicine

## 2017-02-03 ENCOUNTER — Ambulatory Visit (INDEPENDENT_AMBULATORY_CARE_PROVIDER_SITE_OTHER): Payer: 59 | Admitting: Family Medicine

## 2017-02-03 VITALS — BP 118/82 | HR 78 | Temp 97.8°F | Ht 62.0 in | Wt 156.0 lb

## 2017-02-03 DIAGNOSIS — R0782 Intercostal pain: Secondary | ICD-10-CM | POA: Diagnosis not present

## 2017-02-03 DIAGNOSIS — R079 Chest pain, unspecified: Secondary | ICD-10-CM | POA: Diagnosis not present

## 2017-02-03 MED ORDER — DICLOFENAC SODIUM 2 % TD SOLN: 1.0000 "application " | Freq: Two times a day (BID) | TRANSDERMAL | 2 refills | Status: DC

## 2017-02-03 NOTE — Assessment & Plan Note (Signed)
This pain seems to be musculoskeletal in nature. Possible costochondritis nature. Does not appear to be related to cardiac. This is some pain radiating to the back so we will chest x-ray - Pennsaid - Chest x-ray - If no improvement may consider CT of the chest

## 2017-02-03 NOTE — Progress Notes (Signed)
Melanie [name] - 56 y.o. female MRN [id number]  Date of birth: 07-09-1960  SUBJECTIVE:  Including CC & ROS.  Chief Complaint  Patient presents with  . Chest Pain    pulled muscle in chest friday. patient states she works out with a Psychologist, educational and on friday she started feeling a pain and it hurts to breath, sneeze, or cough    Ms. AdamsIs a 56 year old female reports having centralized chest pain. She reports the pain started about 7 days ago. She was seen at the [facility] urgent care on 8/25. She presented with the same complaints to that visit. During that visit an EKG was performed and was normal in appearance. She has been doing more exercises with her personal trainer. 3 days prior to the pain she was doing lateral pull downs as well as shoulder presses. The pain is intermittent. She also reports some midline back pain associated with this. She is not taking any anti-inflammatories as she reports an intolerance to this. She has not had any fever, shortness of breath, or pleuritic chest pain. She denies any travel recently. The pain is sharp in nature. It is significant when it occurs. She reports the pain is unchanged.      Review of Systems  Respiratory: Negative for shortness of breath.   Cardiovascular: Positive for chest pain.  Musculoskeletal: Positive for back pain. Negative for gait problem.  Skin: Negative for color change.  Neurological: Negative for weakness and numbness.  Hematological: Negative for adenopathy.   otherwise negative  HISTORY: Past Medical, Surgical, Social, and Family History Reviewed & Updated per EMR.   Pertinent Historical Findings include:  Past Medical History:  Diagnosis Date  . Allergy   . Asthma   . Osteoporosis     Past Surgical History:  Procedure Laterality Date  . ABDOMINAL HYSTERECTOMY    . APPENDECTOMY    . KNEE SURGERY      Allergies  Allergen Reactions  . Kiwi Extract Hives, Shortness Of Breath and Itching  . Latex   . Penicillins     . Sulfonamide Derivatives     Family History  Problem Relation Age of Onset  . Heart disease Father 73  . Irregular heart beat Mother   . Stroke Maternal Grandmother   . Heart attack Maternal Grandfather   . Stroke Paternal Grandmother   . Heart attack Paternal Grandfather   . Heart attack Brother 59     Social History   Social History  . Marital status: Divorced    Spouse name: N/A  . Number of children: N/A  . Years of education: N/A   Occupational History  . Not on file.   Social History Main Topics  . Smoking status: Never Smoker  . Smokeless tobacco: Never Used  . Alcohol use Yes     Comment: 2/wk  . Drug use: No  . Sexual activity: Not on file   Other Topics Concern  . Not on file   Social History Narrative   Exercises regularly     PHYSICAL EXAM:  VS: BP 118/82 (BP Location: Left Arm, Patient Position: Sitting, Cuff Size: Normal)   Pulse 78   Temp 97.8 F (36.6 C) (Oral)   Ht 5\' 2"  (1.575 m)   Wt 156 lb (70.8 kg)   SpO2 99%   BMI 28.53 kg/m  Physical Exam Gen: NAD, alert, cooperative with exam, well-appearing ENT: normal lips, normal nasal mucosa,  Eye: normal EOM, normal conjunctiva and lids CV:  no edema, +  2 pedal pulses   Resp: no accessory muscle use, non-labored,  Skin: no rashes, no areas of induration  Neuro: normal tone, normal sensation to touch Psych:  normal insight, alert and oriented MSK:  Chest: Tenderness to palpation over the sternum  Normal shoulder range of motion. Normal shrug strength. Some tenderness to the rhomboids and the back. No overlying step off or defect. No pain to palpation of the clavicles or the sternal clavicle junction. Neurovascularly intact    ASSESSMENT & PLAN:   Intercostal pain This pain seems to be musculoskeletal in nature. Possible costochondritis nature. Does not appear to be related to cardiac. This is some pain radiating to the back so we will chest x-ray - Pennsaid - Chest x-ray - If  no improvement may consider CT of the chest

## 2017-02-03 NOTE — Patient Instructions (Addendum)
Thank you for coming in,    Please try using the topical cream for the pain. If you not have any improvement with her symptoms please follow-up with me.   Please feel free to call with any questions or concerns at any time, at [phone]. --Dr. Jordan Likes

## 2017-02-04 ENCOUNTER — Telehealth: Payer: Self-pay | Admitting: Family Medicine

## 2017-02-04 NOTE — Telephone Encounter (Signed)
Spoke with patient about chest xrays. Said she could follow up with a chest xray in the future.   Myra RudeSchmitz, Lateesha Bezold E, MD [facility] 02/04/2017, 8:44 AM

## 2017-02-09 NOTE — Progress Notes (Signed)
Tawana ScaleZach Rubby Barbary D.O. [facility] [address] [place], [place] [address] Phone: [phone]) [phone] Subjective:    I'm seeing this patient by the request  of:    CC: Neck and back pain  LOV:[id number]:Subjective  Melanie [name] is a 56 y.o. female coming in with complaint of neck and back pain. Patient was having more of a chest injury as well. Patient has made some progress but continues to have some discomfort. Feels in her neck and back seems to be out of alignment. Patient states some mild worsening overall.      Past Medical History:  Diagnosis Date  . Allergy   . Asthma   . Osteoporosis    Past Surgical History:  Procedure Laterality Date  . ABDOMINAL HYSTERECTOMY    . APPENDECTOMY    . KNEE SURGERY     Social History   Social History  . Marital status: Divorced    Spouse name: N/A  . Number of children: N/A  . Years of education: N/A   Social History Main Topics  . Smoking status: Never Smoker  . Smokeless tobacco: Never Used  . Alcohol use Yes     Comment: 2/wk  . Drug use: No  . Sexual activity: Not Asked   Other Topics Concern  . None   Social History Narrative   Exercises regularly   Allergies  Allergen Reactions  . Kiwi Extract Hives, Shortness Of Breath and Itching  . Latex   . Penicillins   . Sulfonamide Derivatives    Family History  Problem Relation Age of Onset  . Heart disease Father 1039  . Irregular heart beat Mother   . Stroke Maternal Grandmother   . Heart attack Maternal Grandfather   . Stroke Paternal Grandmother   . Heart attack Paternal Grandfather   . Heart attack Brother 45     Past medical history, social, surgical and family history all reviewed in electronic medical record.  No pertanent information unless stated regarding to the chief complaint.   Review of Systems:Review of systems updated and as accurate as of 02/10/17  No headache, visual changes, nausea, vomiting, diarrhea, constipation, dizziness, abdominal pain, skin  rash, fevers, chills, night sweats, weight loss, swollen lymph nodes, body aches, joint swelling, muscle aches, chest pain, shortness of breath, mood changes.   Objective  Blood pressure 118/78, pulse 82, weight 156 lb (70.8 kg). Systems examined below as of 02/10/17   General: No apparent distress alert and oriented x3 mood and affect normal, dressed appropriately.  HEENT: Pupils equal, extraocular movements intact  Respiratory: Patient's speak in full sentences and does not appear short of breath  Cardiovascular: No lower extremity edema, non tender, no erythema  Skin: Warm dry intact with no signs of infection or rash on extremities or on axial skeleton.  Abdomen: Soft nontender  Neuro: Cranial nerves II through XII are intact, neurovascularly intact in all extremities with 2+ DTRs and 2+ pulses.  Lymph: No lymphadenopathy of posterior or anterior cervical chain or axillae bilaterally.  Gait normal with good balance and coordination.  MSK:  Non tender with full range of motion and good stability and symmetric strength and tone of shoulders, elbows, wrist, hip, knee and ankles bilaterally.  Neck: Inspection unremarkable. No palpable stepoffs. Negative Spurling's maneuver. Ligaments with less 5 of rotation to the left Grip strength and sensation normal in bilateral hands Strength good C4 to T1 distribution No sensory change to C4 to T1 Negative Hoffman sign bilaterally Reflexes normal  Back Exam:  Inspection: Mild spinal stenosis Motion: Flexion 45 deg, Extension 25 deg, Side Bending to 35 deg bilaterally,  Rotation to 35 deg bilaterally  SLR laying: Negative  XSLR laying: Negative  Palpable tenderness: Tender to palpation more the paraspinal musculature of the thoracolumbar juncture and the sacroiliac juncture.[name] FABER: negative. Sensory change: Gross sensation intact to all lumbar and sacral dermatomes.  Reflexes: 2+ at both patellar tendons, 2+ at achilles tendons, Babinski's  downgoing.  Strength at foot  Plantar-flexion: 5/5 Dorsi-flexion: 5/5 Eversion: 5/5 Inversion: 5/5  Leg strength  Quad: 5/5 Hamstring: 5/5 Hip flexor: 5/5 Hip abductors: 4/5 but symmetric Gait unremarkable.  Osteopathic findings C2 flexed rotated and side bent right C4 flexed rotated and side bent left C7 flexed rotated and side bent left T3 extended rotated and side bent right inhaled third rib T6 extended rotated and side bent left L2 flexed rotated and side bent right Sacrum right on right    Impression and Recommendations:     This case required medical decision making of moderate complexity.      Note: This dictation was prepared with Dragon dictation along with smaller phrase technology. Any transcriptional errors that result from this process are unintentional.

## 2017-02-10 ENCOUNTER — Ambulatory Visit (INDEPENDENT_AMBULATORY_CARE_PROVIDER_SITE_OTHER): Payer: 59 | Admitting: Family Medicine

## 2017-02-10 ENCOUNTER — Encounter: Payer: Self-pay | Admitting: Family Medicine

## 2017-02-10 VITALS — BP 118/78 | HR 82 | Wt 156.0 lb

## 2017-02-10 DIAGNOSIS — M542 Cervicalgia: Secondary | ICD-10-CM

## 2017-02-10 DIAGNOSIS — M999 Biomechanical lesion, unspecified: Secondary | ICD-10-CM

## 2017-02-10 NOTE — Assessment & Plan Note (Signed)
Tightness noted. We discussed icing regimen and home exercises. Discussed which activities to do a which ones to avoid. Increase patient's activity is started. Follow-up again in 4 weeks

## 2017-02-10 NOTE — Patient Instructions (Addendum)
Good to see you  Overall you should do well.  If not better in 2 days call and we will send you in colchicine.  No upper body lifting for 3 days then 50% and increase 10-15% a week Keep appointment next week just in case.

## 2017-02-10 NOTE — Assessment & Plan Note (Signed)
Decision today to treat with OMT was based on Physical Exam  After verbal consent patient was treated with HVLA, ME, FPR techniques in cervical, thoracic, lumbar and sacral areas  Patient tolerated the procedure well with improvement in symptoms  Patient given exercises, stretches and lifestyle modifications  See medications in patient instructions if given  Patient will follow up in 4 weeks 

## 2017-02-17 ENCOUNTER — Encounter: Payer: Self-pay | Admitting: Family Medicine

## 2017-02-17 ENCOUNTER — Ambulatory Visit (INDEPENDENT_AMBULATORY_CARE_PROVIDER_SITE_OTHER): Payer: 59 | Admitting: Family Medicine

## 2017-02-17 VITALS — BP 122/84 | HR 84 | Ht 62.0 in | Wt 155.0 lb

## 2017-02-17 DIAGNOSIS — G43919 Migraine, unspecified, intractable, without status migrainosus: Secondary | ICD-10-CM

## 2017-02-17 DIAGNOSIS — G43909 Migraine, unspecified, not intractable, without status migrainosus: Secondary | ICD-10-CM | POA: Insufficient documentation

## 2017-02-17 DIAGNOSIS — G43101 Migraine with aura, not intractable, with status migrainosus: Secondary | ICD-10-CM

## 2017-02-17 DIAGNOSIS — M999 Biomechanical lesion, unspecified: Secondary | ICD-10-CM | POA: Diagnosis not present

## 2017-02-17 MED ORDER — KETOROLAC TROMETHAMINE 60 MG/2ML IM SOLN
60.0000 mg | Freq: Once | INTRAMUSCULAR | Status: AC
  Administered 2017-02-17: 60 mg via INTRAMUSCULAR

## 2017-02-17 NOTE — Progress Notes (Signed)
[name] [address] [place], [place] [address] Phone: [phone] Subjective:    I'm seeing this patient by the request  of:    CC:   Migraine headache  LKG:[id number]  Melanie [name] is a 56 y.o. female coming in with complaint of  Migraine headache.Patient has histories. Discussed with patient at great length. Patient has tried a couple different medications on no significant improvement. Has responded to manipulation in the past. Having a lot of tightness. Did have an aura before this started. Thinks it secondary to the weather.Seems to be about her regular migraines.     Past Medical History:  Diagnosis Date  . Allergy   . Asthma   . Osteoporosis    Past Surgical History:  Procedure Laterality Date  . ABDOMINAL HYSTERECTOMY    . APPENDECTOMY    . KNEE SURGERY     Social History   Social History  . Marital status: Divorced    Spouse name: N/A  . Number of children: N/A  . Years of education: N/A   Social History Main Topics  . Smoking status: Never Smoker  . Smokeless tobacco: Never Used  . Alcohol use Yes     Comment: 2/wk  . Drug use: No  . Sexual activity: Not Asked   Other Topics Concern  . None   Social History Narrative   Exercises regularly   Allergies  Allergen Reactions  . Kiwi Extract Hives, Shortness Of Breath and Itching  . Latex   . Penicillins   . Sulfonamide Derivatives    Family History  Problem Relation Age of Onset  . Heart disease Father 62  . Irregular heart beat Mother   . Stroke Maternal Grandmother   . Heart attack Maternal Grandfather   . Stroke Paternal Grandmother   . Heart attack Paternal Grandfather   . Heart attack Brother 45     Past medical history, social, surgical and family history all reviewed in electronic medical record.  No pertanent information unless stated regarding to the chief complaint.   Review of Systems:Review of systems updated and as accurate as of 02/17/17  No  visual changes, nausea, vomiting, diarrhea, constipation, dizziness, abdominal pain, skin rash, fevers, chills, night sweats, weight loss, swollen lymph nodes, body aches, joint swelling,  chest pain, shortness of breath, mood changes. + muscle aches + headaches.   Objective  Blood pressure 122/84, pulse 84, height  (1.575 m), weight 155 lb (70.3 kg), SpO2 96 %. Systems examined below as of 02/17/17   General: No apparent distress alert and oriented x3 mood and affect normal, dressed appropriately.  HEENT: Pupils equal, extraocular movements intact  Respiratory: Patient's speak in full sentences and does not appear short of breath  Cardiovascular: No lower extremity edema, non tender, no erythema  Skin: Warm dry intact with no signs of infection or rash on extremities or on axial skeleton.  Abdomen: Soft nontender  Neuro: Cranial nerves II through XII are intact, neurovascularly intact in all extremities with 2+ DTRs and 2+ pulses.  Lymph: No lymphadenopathy of posterior or anterior cervical chain or axillae bilaterally.  Gait normal with good balance and coordination.  MSK:  Non tender with full range of motion and good stability and symmetric strength and tone of shoulders, elbows, wrist, hip, knee and ankles bilaterally.  Neck: Inspection  Loss of lordosis. No palpable stepoffs. Negative Spurling's maneuver. Mild limitationin right-sided side bending and left-sided rotation Grip strength and sensation normal  in bilateral hands Strength good C4 to T1 distribution No sensory change to C4 to T1 Negative Hoffman sign bilaterally Reflexes normal  and severe tightness of the base of the occiput  Osteopathic findings Cervical C2 flexed rotated and side bent left  C4 flexed rotated and side bent left C7 flexed rotated and side bent right  T1 extended rotated and side bent left elevated first rib    Impression and Recommendations:     This case required medical decision  making of moderate complexity.      Note: This dictation was prepared with Dragon dictation along with smaller phrase technology. Any transcriptional errors that result from this process are unintentional.

## 2017-02-17 NOTE — Assessment & Plan Note (Signed)
Decision today to treat with OMT was based on Physical Exam  After verbal consent patient was treated with HVLA, ME, FPR techniques in cervical, thoracic, lumbar and sacral areas  Patient tolerated the procedure well with improvement in symptoms  Patient given exercises, stretches and lifestyle modifications  See medications in patient instructions if given  Patient will follow up in 2 weeks 

## 2017-02-17 NOTE — Patient Instructions (Signed)
Good to see you  Ice is your friend I hope this breaks quick.  See me again in 2 weeks

## 2017-02-17 NOTE — Assessment & Plan Note (Signed)
Active, discussed avoiding certain activities.  Toradol.  OMT helped RTC in 2 weeks.

## 2017-03-05 ENCOUNTER — Ambulatory Visit (INDEPENDENT_AMBULATORY_CARE_PROVIDER_SITE_OTHER): Payer: 59 | Admitting: Family Medicine

## 2017-03-05 ENCOUNTER — Encounter: Payer: Self-pay | Admitting: Family Medicine

## 2017-03-05 VITALS — BP 120/70 | HR 78 | Ht 62.0 in | Wt 155.0 lb

## 2017-03-05 DIAGNOSIS — M542 Cervicalgia: Secondary | ICD-10-CM

## 2017-03-05 DIAGNOSIS — M999 Biomechanical lesion, unspecified: Secondary | ICD-10-CM

## 2017-03-05 NOTE — Patient Instructions (Addendum)
Good to see you  You are doing great  CoQ10  daily  2 tennis ball in a tube sock at base of skull when headache is going on See me again in 4-6 weeks.

## 2017-03-05 NOTE — Progress Notes (Signed)
[name] [address] [place], [place] [address] Phone: [phone] Subjective:    I'  CC:  Headache and neck pain follow-up   BJY:[id number]  Melanie [name] is a 56 y.o. female coming in with complaint of  Migraine headache.Patient has history of migraines.. Patient has tried a couple different medications on no significant improvement. . Patient does have some neck tightness as well. Has responded fairly well to manipulation the past. Patient has been able to start working out again on a regular basis.     Past Medical History:  Diagnosis Date  . Allergy   . Asthma   . Osteoporosis    Past Surgical History:  Procedure Laterality Date  . ABDOMINAL HYSTERECTOMY    . APPENDECTOMY    . KNEE SURGERY     Social History   Social History  . Marital status: Divorced    Spouse name: N/A  . Number of children: N/A  . Years of education: N/A   Social History Main Topics  . Smoking status: Never Smoker  . Smokeless tobacco: Never Used  . Alcohol use Yes     Comment: 2/wk  . Drug use: No  . Sexual activity: Not Asked   Other Topics Concern  . None   Social History Narrative   Exercises regularly   Allergies  Allergen Reactions  . Kiwi Extract Hives, Shortness Of Breath and Itching  . Latex   . Penicillins   . Sulfonamide Derivatives    Family History  Problem Relation Age of Onset  . Heart disease Father 40  . Irregular heart beat Mother   . Stroke Maternal Grandmother   . Heart attack Maternal Grandfather   . Stroke Paternal Grandmother   . Heart attack Paternal Grandfather   . Heart attack Brother 45     Past medical history, social, surgical and family history all reviewed in electronic medical record.  No pertanent information unless stated regarding to the chief complaint.   Review of Systems:Review of systems updated and as accurate as of 03/05/17  No  visual changes, nausea, vomiting, diarrhea, constipation,  dizziness, abdominal pain, skin rash, fevers, chills, night sweats, weight loss, swollen lymph nodes, body aches, joint swelling,  chest pain, shortness of breath, mood changes. + muscle aches + headaches.   Objective  Blood pressure 120/70, pulse 78, height  (1.575 m), weight 155 lb (70.3 kg), SpO2 97 %. Systems examined below as of 03/05/17   General: No apparent distress alert and oriented x3 mood and affect normal, dressed appropriately.  HEENT: Pupils equal, extraocular movements intact  Respiratory: Patient's speak in full sentences and does not appear short of breath  Cardiovascular: No lower extremity edema, non tender, no erythema  Skin: Warm dry intact with no signs of infection or rash on extremities or on axial skeleton.  Abdomen: Soft nontender  Neuro: Cranial nerves II through XII are intact, neurovascularly intact in all extremities with 2+ DTRs and 2+ pulses.  Lymph: No lymphadenopathy of posterior or anterior cervical chain or axillae bilaterally.  Gait normal with good balance and coordination.  MSK:  Non tender with full range of motion and good stability and symmetric strength and tone of shoulders, elbows, wrist, hip, knee and ankles bilaterally.  Neck: Inspection  Loss of lordosis. No palpable stepoffs. Negative Spurling's maneuver. Improved range of motion from previous exam with good grip strength Strength good C4 to T1 distribution No sensory change to C4 to T1  Negative Hoffman sign bilaterally Reflexes normal  and severe tightness of the base of the occiput  Osteopathic findings C2 flexed rotated and side bent right C4 flexed rotated and side bent left C7 flexed rotated and side bent left T3 extended rotated and side bent right inhaled third rib T6 extended rotated and side bent left L3 flexed rotated and side bent right Sacrum right on right    Impression and Recommendations:     This case required medical decision making of moderate  complexity.      Note: This dictation was prepared with Dragon dictation along with smaller phrase technology. Any transcriptional errors that result from this process are unintentional.

## 2017-03-05 NOTE — Assessment & Plan Note (Signed)
Patient does have some tightness. We discussed icing regimen and home exercises. We discussed which activities doing which ones to avoid. Patient will start increasing activity is still noted. Patient will come back and see me again in 4 weeks

## 2017-03-05 NOTE — Assessment & Plan Note (Signed)
Decision today to treat with OMT was based on Physical Exam  After verbal consent patient was treated with HVLA, ME, FPR techniques in cervical, thoracic, lumbar and sacral areas  Patient tolerated the procedure well with improvement in symptoms  Patient given exercises, stretches and lifestyle modifications  See medications in patient instructions if given  Patient will follow up in 4 weeks 

## 2017-03-17 ENCOUNTER — Ambulatory Visit (INDEPENDENT_AMBULATORY_CARE_PROVIDER_SITE_OTHER): Payer: 59 | Admitting: Family Medicine

## 2017-03-17 ENCOUNTER — Encounter: Payer: Self-pay | Admitting: Family Medicine

## 2017-03-17 VITALS — BP 118/70 | HR 63 | Ht 62.0 in

## 2017-03-17 DIAGNOSIS — M999 Biomechanical lesion, unspecified: Secondary | ICD-10-CM

## 2017-03-17 DIAGNOSIS — G5702 Lesion of sciatic nerve, left lower limb: Secondary | ICD-10-CM | POA: Diagnosis not present

## 2017-03-17 MED ORDER — TIZANIDINE HCL 2 MG PO TABS: 2.0000 mg | ORAL_TABLET | Freq: Every evening | ORAL | 2 refills | Status: AC

## 2017-03-17 NOTE — Assessment & Plan Note (Signed)
Believe the patient does have a lifting injury. Possibly even some lumbar radiculopathy but with a history of the piriformis treated as such. We'll give her the exercises again. Responded well to manipulation. Is making progress. We discussed icing regimen. Patient will come back and see me again in 3 weeks for further evaluation and treatment.

## 2017-03-17 NOTE — Progress Notes (Signed)
Melanie [name] [address] [place], [place] [address] Phone: [phone] Subjective:    CC: Low back pain  [id number]  Melanie [name] is a 56 y.o. female coming in with complaint of low back pain. Patient states that she was trying to move a curtain of water and had severe amount of pain and lower back. Describes the pain as a dull, throbbing aching pain. Patient denies any radiation down the legs. Patient denies any weakness. States that it seems to be improving but did have a week where she felt like she cannot even flexed her back at all.     Past Medical History:  Diagnosis Date  . Allergy   . Asthma   . Osteoporosis    Past Surgical History:  Procedure Laterality Date  . ABDOMINAL HYSTERECTOMY    . APPENDECTOMY    . KNEE SURGERY     Social History   Social History  . Marital status: Divorced    Spouse name: N/A  . Number of children: N/A  . Years of education: N/A   Social History Main Topics  . Smoking status: Never Smoker  . Smokeless tobacco: Never Used  . Alcohol use Yes     Comment: 2/wk  . Drug use: No  . Sexual activity: Not Asked   Other Topics Concern  . None   Social History Narrative   Exercises regularly   Allergies  Allergen Reactions  . Kiwi Extract Hives, Shortness Of Breath and Itching  . Latex   . Penicillins   . Sulfonamide Derivatives    Family History  Problem Relation Age of Onset  . Heart disease Father 61  . Irregular heart beat Mother   . Stroke Maternal Grandmother   . Heart attack Maternal Grandfather   . Stroke Paternal Grandmother   . Heart attack Paternal Grandfather   . Heart attack Brother 45     Past medical history, social, surgical and family history all reviewed in electronic medical record.  No pertanent information unless stated regarding to the chief complaint.   Review of Systems:Review of systems updated and as accurate as of 03/17/17  No visual changes, nausea,  vomiting, diarrhea, constipation, dizziness, abdominal pain, skin rash, fevers, chills, night sweats, weight loss, swollen lymph nodes, body aches, joint swelling,  chest pain, shortness of breath, mood changes. Positive headaches, muscle aches  Objective  Blood pressure 118/70, pulse 63, height  (1.575 m), SpO2 95 %. Systems examined below as of 03/17/17   General: No apparent distress alert and oriented x3 mood and affect normal, dressed appropriately.  HEENT: Pupils equal, extraocular movements intact  Respiratory: Patient's speak in full sentences and does not appear short of breath  Cardiovascular: No lower extremity edema, non tender, no erythema  Skin: Warm dry intact with no signs of infection or rash on extremities or on axial skeleton.  Abdomen: Soft nontender  Neuro: Cranial nerves II through XII are intact, neurovascularly intact in all extremities with 2+ DTRs and 2+ pulses.  Lymph: No lymphadenopathy of posterior or anterior cervical chain or axillae bilaterally.  Gait normal with good balance and coordination.  MSK:  Non tender with full range of motion and good stability and symmetric strength and tone of shoulders, elbows, wrist, hip, knee and ankles bilaterally.  Neck: Inspection unremarkable. No palpable stepoffs. Negative Spurling's maneuver. Full neck range of motion Grip strength and sensation normal in bilateral hands Strength good C4 to T1 distribution No sensory  change to C4 to T1 Negative Hoffman sign bilaterally Reflexes normal Mild tightness in the trapezius bilaterally  Back Exam:  Inspection: Unremarkable  Motion: Flexion 45 deg, Extension 45 deg, Side Bending to 45 deg bilaterally,  Rotation to 45 deg bilaterally  SLR laying: Negative  XSLR laying: Negative  Palpable tenderness: Tender to palpation of the paraspinal musculature lumbar spine right greater than left. FABER: Positive Faber left. Sensory change: Gross sensation intact to all lumbar  and sacral dermatomes.  Reflexes: 2+ at both patellar tendons, 2+ at achilles tendons, Babinski's downgoing.  Strength at foot  Plantar-flexion: 5/5 Dorsi-flexion: 5/5 Eversion: 5/5 Inversion: 5/5  Leg strength  Quad: 5/5 Hamstring: 5/5 Hip flexor: 5/5 Hip abductors: 4/5 but symmetric Gait unremarkable.  Osteopathic findings C2 flexed rotated and side bent right C4 flexed rotated and side bent left C7 flexed rotated and side bent left T3 extended rotated and side bent right inhaled third rib T7 extended rotated and side bent left L3 flexed rotated and side bent right Sacrum left on left    Impression and Recommendations:     This case required medical decision making of moderate complexity.      Note: This dictation was prepared with Dragon dictation along with smaller phrase technology. Any transcriptional errors that result from this process are unintentional.

## 2017-03-17 NOTE — Assessment & Plan Note (Signed)
Decision today to treat with OMT was based on Physical Exam  After verbal consent patient was treated with HVLA, ME, FPR techniques in cervical, thoracic, lumbar and sacral areas  Patient tolerated the procedure well with improvement in symptoms  Patient given exercises, stretches and lifestyle modifications  See medications in patient instructions if given  Patient will follow up in 4 weeks 

## 2017-03-17 NOTE — Patient Instructions (Signed)
Good to see you  I will get you a muscle relaxer to take at night if you need it.  Stay active.  Try to avoid a lot of back rotation or repetitive flexion.  See me again in 4 weeks.  [phone]

## 2017-04-09 ENCOUNTER — Ambulatory Visit (INDEPENDENT_AMBULATORY_CARE_PROVIDER_SITE_OTHER): Payer: 59 | Admitting: Family Medicine

## 2017-04-09 ENCOUNTER — Encounter: Payer: Self-pay | Admitting: Family Medicine

## 2017-04-09 VITALS — BP 118/76 | HR 87 | Ht 62.0 in | Wt 156.0 lb

## 2017-04-09 DIAGNOSIS — M542 Cervicalgia: Secondary | ICD-10-CM

## 2017-04-09 DIAGNOSIS — M999 Biomechanical lesion, unspecified: Secondary | ICD-10-CM | POA: Diagnosis not present

## 2017-04-09 NOTE — Assessment & Plan Note (Signed)
Decision today to treat with OMT was based on Physical Exam  After verbal consent patient was treated with HVLA, ME, FPR techniques in cervical, thoracic, lumbar and sacral areas  Patient tolerated the procedure well with improvement in symptoms  Patient given exercises, stretches and lifestyle modifications  See medications in patient instructions if given  Patient will follow up in 4 weeks 

## 2017-04-09 NOTE — Assessment & Plan Note (Signed)
Patient does have more of posture trouble. We discussed other ergonomic changes that can be beneficial. Patient is going to try to make some changes to those. Continue with osteopathic manipulation. Follow-up again in 4 weeks

## 2017-04-09 NOTE — Progress Notes (Signed)
[name] [address] [place], [place] [address] Phone: [phone] Subjective:     CC: Neck and back pain follow-up  [id number]  Melanie [name] is a 56 y.o. female coming in with complaint of neck pain and back pain follow-up. She said that she has been doing pretty good but knows that the weather is changing. Patient states still some mild discomfort.      Past Medical History:  Diagnosis Date  . Allergy   . Asthma   . Osteoporosis    Past Surgical History:  Procedure Laterality Date  . ABDOMINAL HYSTERECTOMY    . APPENDECTOMY    . KNEE SURGERY     Social History   Social History  . Marital status: Divorced    Spouse name: N/A  . Number of children: N/A  . Years of education: N/A   Social History Main Topics  . Smoking status: Never Smoker  . Smokeless tobacco: Never Used  . Alcohol use Yes     Comment: 2/wk  . Drug use: No  . Sexual activity: Not on file   Other Topics Concern  . Not on file   Social History Narrative   Exercises regularly   Allergies  Allergen Reactions  . Kiwi Extract Hives, Shortness Of Breath and Itching  . Latex   . Penicillins   . Sulfonamide Derivatives    Family History  Problem Relation Age of Onset  . Heart disease Father 36  . Irregular heart beat Mother   . Stroke Maternal Grandmother   . Heart attack Maternal Grandfather   . Stroke Paternal Grandmother   . Heart attack Paternal Grandfather   . Heart attack Brother 45     Past medical history, social, surgical and family history all reviewed in electronic medical record.  No pertanent information unless stated regarding to the chief complaint.   Review of Systems:Review of systems updated and as accurate as of 04/09/17  No visual changes, nausea, vomiting, diarrhea, constipation, dizziness, abdominal pain, skin rash, fevers, chills, night sweats, weight loss, swollen lymph nodes, body aches, joint swelling,  chest pain,  shortness of breath, mood changes. Positive muscle aches positive headaches  Objective  Blood pressure 118/76, pulse 87, height 5\' 2"  (1.575 m), weight 156 lb (70.8 kg), SpO2 98 %. Systems examined below as of 04/09/17   General: No apparent distress alert and oriented x3 mood and affect normal, dressed appropriately.  HEENT: Pupils equal, extraocular movements intact  Respiratory: Patient's speak in full sentences and does not appear short of breath  Cardiovascular: No lower extremity edema, non tender, no erythema  Skin: Warm dry intact with no signs of infection or rash on extremities or on axial skeleton.  Abdomen: Soft nontender  Neuro: Cranial nerves II through XII are intact, neurovascularly intact in all extremities with 2+ DTRs and 2+ pulses.  Lymph: No lymphadenopathy of posterior or anterior cervical chain or axillae bilaterally.  Gait normal with good balance and coordination.  MSK:  Non tender with full range of motion and good stability and symmetric strength and tone of shoulders, elbows, wrist, hip, knee and ankles bilaterally.  Back Exam:  Inspection: Mild loss of lordosis poor posture Motion: Flexion 45 deg, Extension 25 deg, Side Bending to 45 deg bilaterally,  Rotation to 45 deg bilaterally  SLR laying: Negative  XSLR laying: Negative  Palpable tenderness: Tender to palpation of the paraspinal musculature lumbar spine. FABER: Mild. Sensory change: Gross sensation intact to  all lumbar and sacral dermatomes.  Reflexes: 2+ at both patellar tendons, 2+ at achilles tendons, Babinski's downgoing.  Strength at foot  Plantar-flexion: 5/5 Dorsi-flexion: 5/5 Eversion: 5/5 Inversion: 5/5  Leg strength  Quad: 5/5 Hamstring: 5/5 Hip flexor: 5/5 Hip abductors: 4/5 but symmetric Gait unremarkable.  Osteopathic findings C2 flexed rotated and side bent right C4 flexed rotated and side bent left C7 flexed rotated and side bent left T3 extended rotated and side bent right inhaled  third rib T6 extended rotated and side bent left L3 flexed rotated and side bent right Sacrum right on right    Impression and Recommendations:     This case required medical decision making of moderate complexity.      Note: This dictation was prepared with Dragon dictation along with smaller phrase technology. Any transcriptional errors that result from this process are unintentional.

## 2017-04-09 NOTE — Patient Instructions (Signed)
Good to see you  Exercises on wall.  Heel and butt touching.  Raise leg 6 inches and hold 2 seconds.  Down slow for count of 4 seconds.  1 set of 30 reps daily on both sides.  On wall with heels, butt shoulder and head touching for a goal of 5 minutes daily  Firm, memory foam non contour pillow  Stay active See me again in 4 weeks

## 2017-04-10 ENCOUNTER — Other Ambulatory Visit (INDEPENDENT_AMBULATORY_CARE_PROVIDER_SITE_OTHER): Payer: 59

## 2017-04-10 ENCOUNTER — Ambulatory Visit (INDEPENDENT_AMBULATORY_CARE_PROVIDER_SITE_OTHER): Payer: 59 | Admitting: Nurse Practitioner

## 2017-04-10 ENCOUNTER — Encounter: Payer: Self-pay | Admitting: Nurse Practitioner

## 2017-04-10 VITALS — BP 118/80 | HR 73 | Temp 97.7°F | Ht 62.0 in | Wt 157.0 lb

## 2017-04-10 DIAGNOSIS — Z13220 Encounter for screening for lipoid disorders: Secondary | ICD-10-CM | POA: Diagnosis not present

## 2017-04-10 DIAGNOSIS — J302 Other seasonal allergic rhinitis: Secondary | ICD-10-CM | POA: Diagnosis not present

## 2017-04-10 DIAGNOSIS — Z0001 Encounter for general adult medical examination with abnormal findings: Secondary | ICD-10-CM

## 2017-04-10 DIAGNOSIS — D72819 Decreased white blood cell count, unspecified: Secondary | ICD-10-CM

## 2017-04-10 DIAGNOSIS — Z136 Encounter for screening for cardiovascular disorders: Secondary | ICD-10-CM

## 2017-04-10 DIAGNOSIS — J4520 Mild intermittent asthma, uncomplicated: Secondary | ICD-10-CM | POA: Diagnosis not present

## 2017-04-10 DIAGNOSIS — G43101 Migraine with aura, not intractable, with status migrainosus: Secondary | ICD-10-CM

## 2017-04-10 DIAGNOSIS — Z1211 Encounter for screening for malignant neoplasm of colon: Secondary | ICD-10-CM

## 2017-04-10 DIAGNOSIS — Z0000 Encounter for general adult medical examination without abnormal findings: Secondary | ICD-10-CM | POA: Diagnosis not present

## 2017-04-10 DIAGNOSIS — D729 Disorder of white blood cells, unspecified: Secondary | ICD-10-CM

## 2017-04-10 DIAGNOSIS — Z713 Dietary counseling and surveillance: Secondary | ICD-10-CM

## 2017-04-10 LAB — LIPID PANEL
CHOL/HDL RATIO: 3
Cholesterol: 187 mg/dL (ref 0–200)
HDL: 62.9 mg/dL (ref >39.00)
LDL CALC: 115 mg/dL — AB (ref 0–99)
NONHDL: 124.58
TRIGLYCERIDES: 46 mg/dL (ref 0.0–149.0)
VLDL: 9.2 mg/dL (ref 0.0–40.0)

## 2017-04-10 LAB — COMPREHENSIVE METABOLIC PANEL
ALT: 48 U/L — ABNORMAL HIGH (ref 0–35)
AST: 29 U/L (ref 0–37)
Albumin: 4 g/dL (ref 3.5–5.2)
Alkaline Phosphatase: 82 U/L (ref 39–117)
BUN: 12 mg/dL (ref 6–23)
CHLORIDE: 106 meq/L (ref 96–112)
CO2: 28 meq/L (ref 19–32)
Calcium: 9.3 mg/dL (ref 8.4–10.5)
Creatinine, Ser: 0.76 mg/dL (ref 0.40–1.20)
GFR: 101.73 mL/min (ref >60.00)
GLUCOSE: 95 mg/dL (ref 70–99)
POTASSIUM: 3.6 meq/L (ref 3.5–5.1)
SODIUM: 140 meq/L (ref 135–145)
Total Bilirubin: 0.5 mg/dL (ref 0.2–1.2)
Total Protein: 7.5 g/dL (ref 6.0–8.3)

## 2017-04-10 LAB — CBC
HEMATOCRIT: 35.9 % — AB (ref 36.0–46.0)
HEMOGLOBIN: 11.9 g/dL — AB (ref 12.0–15.0)
MCHC: 33.2 g/dL (ref 30.0–36.0)
MCV: 94.7 fl (ref 78.0–100.0)
PLATELETS: 165 10*3/uL (ref 150.0–400.0)
RBC: 3.8 Mil/uL — ABNORMAL LOW (ref 3.87–5.11)
RDW: 13.5 % (ref 11.5–15.5)
WBC: 3.9 10*3/uL — ABNORMAL LOW (ref 4.0–10.5)

## 2017-04-10 LAB — TSH: TSH: 1.11 u[IU]/mL (ref 0.35–4.50)

## 2017-04-10 MED ORDER — RIZATRIPTAN BENZOATE 10 MG PO TBDP: 10.0000 mg | ORAL_TABLET | ORAL | 1 refills | Status: DC | PRN

## 2017-04-10 MED ORDER — ALBUTEROL SULFATE HFA 108 (90 BASE) MCG/ACT IN AERS: 1.0000 | INHALATION_SPRAY | Freq: Four times a day (QID) | RESPIRATORY_TRACT | 5 refills | Status: DC | PRN

## 2017-04-10 MED ORDER — PHENTERMINE HCL 15 MG PO CAPS: 15.0000 mg | ORAL_CAPSULE | ORAL | 1 refills | Status: DC

## 2017-04-10 MED ORDER — AZELASTINE HCL 0.1 % NA SOLN: 1.0000 | Freq: Two times a day (BID) | NASAL | 5 refills | Status: DC

## 2017-04-10 MED FILL — AZELASTINE HCL 137 MCG SPRY: 0.1 | 50 days supply | Qty: 30 | Fill #0

## 2017-04-10 MED FILL — VENTOLIN HFA 90 MCG INHALER: 108 (90 BAS | 25 days supply | Qty: 18 | Fill #0

## 2017-04-10 MED FILL — PHENTERMINE 15 MG CAPSULE: 15 | 30 days supply | Qty: 30 | Fill #0

## 2017-04-10 MED FILL — RIZATRIPTAN 10 MG ODT: 10 | 30 days supply | Qty: 10 | Fill #0

## 2017-04-10 NOTE — Progress Notes (Signed)
Subjective:    Patient ID: Melanie [name], female    DOB: Oct 08, 1960, 56 y.o.   MRN: [id number]  Patient presents today for complete physical  HPI  Migraine: Stable. Has not use maxalt in several months.  Asthma: Controlled with Qvar. Rarely uses rescue inhaler. Will like to switch from ventolin to proair due to side effects (palpitations, headache and lightheadedness).  Obesity: Exercised daily and meets with trainer once a week. Reports difficulty in weight loss since she turned 52. She has also changed diet to low fat/low carb and decreased portion. She will like to try weight loss medication but concerned about potential side effects. Wt Readings from Last 3 Encounters:  04/10/17 157 lb (71.2 kg)  04/09/17 156 lb (70.8 kg)  03/05/17 155 lb (70.3 kg)   Immunizations: (TDAP, Hep C screen, Pneumovax, Influenza, zoster)  Health Maintenance  Topic Date Due  . Tetanus Vaccine  07/31/1981  . Pap Smear  08/01/1983  . Colon Cancer Screening  07/31/2012  . Mammogram  11/26/2014  . Flu Shot  09/07/2017*  .  Hepatitis C: One time screening is recommended by Center for Disease Control  (CDC) for  adults born from [birth year] through 1965.   Completed  . HIV Screening  Completed  *Topic was postponed. The date shown is not the original due date.    Fall Risk: Fall Risk  04/10/2017 01/31/2017 01/28/2016 10/23/2015  Falls in the past year? No No No No   Depression/Suicide: Depression screen [facility] 2/9 04/10/2017 01/31/2017 01/28/2016 10/23/2015 08/26/2015 08/06/2015 06/24/2015  Decreased Interest 0 0 0 0 0 0 0  Down, Depressed, Hopeless 0 0 0 0 0 0 0  PHQ - 2 Score 0 0 0 0 0 0 0   No flowsheet data found. Colonoscopy (every 5-28yrs, >50-65yrs):needed.  Pap Smear (every 20yrs for >21-29 without HPV, every 57yrs for >30-62yrs with HPV):patient will schedule with GYN  Mammogram (yearly, >25yrs):patient will schedule with GYN.  Vision:up to date per patient.  Dental:up to date per  patient  Advanced Directive: Advanced Directives 01/31/2017  Does Patient Have a Medical Advance Directive? No;Yes  Type of Advance Directive Living will  Would patient like information on creating a medical advance directive? -     Medications and allergies reviewed with patient and updated if appropriate.  Patient Active Problem List   Diagnosis Date Noted  . Migraine headache 02/17/2017  . Intercostal pain 02/03/2017  . Polyarthralgia 08/23/2015  . Asthma with acute exacerbation 02/28/2015  . Quadriceps tendinitis 11/16/2014  . Neck pain 07/07/2014  . Concussion with no loss of consciousness 07/07/2014  . Nonallopathic lesion of lumbosacral region 11/04/2013  . Nonallopathic lesion of thoracic region 11/04/2013  . Piriformis syndrome of left side 10/14/2013  . Greater trochanteric bursitis of left hip 10/14/2013  . Trapezius muscle spasm 11/26/2012  . Nonallopathic lesion of cervicothoracic region 11/26/2012  . Seasonal allergies 09/28/2012  . Asthma attack 12/01/2011  . Backache 09/26/2009  . OTHER ACQUIRED DEFORMITY OF ANKLE AND FOOT OTHER 09/26/2009  . ABNORMALITY OF GAIT 09/26/2009  . KNEE PAIN, RIGHT 08/08/2009  . TENDINITIS, PATELLAR 08/08/2009    Current Outpatient Prescriptions on File Prior to Visit  Medication Sig Dispense Refill  . beclomethasone (QVAR) 40 MCG/ACT inhaler Inhale 1 puff into the lungs 2 (two) times daily. Yearly physical due in April must see MD for refills 1 Inhaler 3  . cholecalciferol (VITAMIN D) 1000 units tablet Take 2,000 Units by mouth daily.    [name]  Diclofenac Sodium (PENNSAID) 2 % SOLN Place 1 application onto the skin 2 (two) times daily. 1 Bottle 2  . Flaxseed, Linseed, (FLAX SEED OIL) 1000 MG CAPS Take 1,000 mg by mouth.    [name] ipratropium (ATROVENT) 0.03 % nasal spray Place 2 sprays into both nostrils every 12 (twelve) hours. 30 mL 12  . loratadine (CLARITIN) 10 MG tablet Take 10 mg by mouth daily.    . Multiple Vitamin (MULTIVITAMIN)  tablet Take 1 tablet by mouth daily.    . Probiotic Product (PROBIOTIC DAILY PO) Take by mouth.     Current Facility-Administered Medications on File Prior to Visit  Medication Dose Route Frequency Provider Last Rate Last Dose  . ipratropium-albuterol (DUONEB) 0.5-2.5 (3) MG/3ML nebulizer solution 3 mL  3 mL Nebulization Once Ragnar Waas, Bonna Gains, NP        Past Medical History:  Diagnosis Date  . Allergy   . Asthma   . Osteoporosis     Past Surgical History:  Procedure Laterality Date  . ABDOMINAL HYSTERECTOMY    . APPENDECTOMY    . KNEE SURGERY      Social History   Social History  . Marital status: Divorced    Spouse name: N/A  . Number of children: N/A  . Years of education: N/A   Social History Main Topics  . Smoking status: Never Smoker  . Smokeless tobacco: Never Used  . Alcohol use Yes     Comment: 2/wk  . Drug use: No  . Sexual activity: Not Asked   Other Topics Concern  . None   Social History Narrative   Exercises regularly    Family History  Problem Relation Age of Onset  . Heart disease Father 23  . Irregular heart beat Mother   . Stroke Maternal Grandmother   . Heart attack Maternal Grandfather   . Stroke Paternal Grandmother   . Heart attack Paternal Grandfather   . Heart attack Brother 45        Review of Systems  Constitutional: Negative for fever, malaise/fatigue and weight loss.  HENT: Negative for congestion and sore throat.   Eyes:       Negative for visual changes  Respiratory: Negative for cough and shortness of breath.   Cardiovascular: Negative for chest pain, palpitations and leg swelling.  Gastrointestinal: Negative for blood in stool, constipation, diarrhea and heartburn.  Genitourinary: Negative for dysuria, frequency and urgency.  Musculoskeletal: Positive for joint pain. Negative for falls and myalgias.  Skin: Negative for rash.  Neurological: Negative for dizziness, sensory change, weakness and headaches.   Endo/Heme/Allergies: Does not bruise/bleed easily.  Psychiatric/Behavioral: Negative for depression, substance abuse and suicidal ideas. The patient is not nervous/anxious and does not have insomnia.     Objective:   Vitals:   04/10/17 0829  BP: 118/80  Pulse: 73  Temp: 97.7 F (36.5 C)  SpO2: 99%    Body mass index is 28.72 kg/m.   Physical Examination:  Physical Exam  Constitutional: She is oriented to person, place, and time and well-developed, well-nourished, and in no distress. No distress.  HENT:  Right Ear: External ear normal.  Left Ear: External ear normal.  Nose: Nose normal.  Mouth/Throat: Oropharynx is clear and moist. No oropharyngeal exudate.  Eyes: Pupils are equal, round, and reactive to light. Conjunctivae and EOM are normal. No scleral icterus.  Neck: Normal range of motion. Neck supple. No thyromegaly present.  Cardiovascular: Normal rate, normal heart sounds and intact distal pulses.  Pulmonary/Chest: Effort normal and breath sounds normal. She exhibits no tenderness.  Abdominal: Soft. Bowel sounds are normal. She exhibits no distension. There is no tenderness.  Musculoskeletal: Normal range of motion. She exhibits no edema or tenderness.  Lymphadenopathy:    She has no cervical adenopathy.  Neurological: She is alert and oriented to person, place, and time. Gait normal.  Skin: Skin is warm and dry.  Psychiatric: Affect and judgment normal.    ASSESSMENT and PLAN:  Shammara was seen today for annual exam.  Diagnoses and all orders for this visit:  Encounter for preventative adult health care exam with abnormal findings -     Comprehensive metabolic panel; Future -     CBC; Future -     TSH; Future -     Lipid panel; Future  Seasonal allergies -     azelastine (ASTELIN) 0.1 % nasal spray; Place 1 spray into both nostrils 2 (two) times daily.  Encounter for lipid screening for cardiovascular disease -     Lipid panel; Future  Colon  cancer screening -     Ambulatory referral to Gastroenterology  Weight loss counseling, encounter for -     phentermine 15 MG capsule; Take 1 capsule (15 mg total) by mouth every morning.  Migraine with aura and with status migrainosus, not intractable -     rizatriptan (MAXALT-MLT) 10 MG disintegrating tablet; Take 1 tablet (10 mg total) by mouth as needed for migraine. May repeat in 2 hours if needed  Mild intermittent asthma without complication -     albuterol (PROAIR HFA) 108 (90 Base) MCG/ACT inhaler; Inhale 1 puff into the lungs every 6 (six) hours as needed for wheezing or shortness of breath.   No problem-specific Assessment & Plan notes found for this encounter.     Follow up: Return in about 1 month (around 05/10/2017) for weight loss.  Alysia Pennaharlotte Jasper Hanf, NP

## 2017-04-10 NOTE — Patient Instructions (Addendum)
Please have pap smear and mammogram results to [place]. Get Tdap and influenza records from Employee health. You will be contacted to schedule appt for colonoscopy.  Please go to basement for blood draw. You will be called with results.  Discussed with patient about different weight loss medications and their potential side effects (Alli, phentermine, Qsymia, Contrave, and belviq). She decided to try a low dose phentermine to minimize side effects.  Health Maintenance, Female Adopting a healthy lifestyle and getting preventive care can go a long way to promote health and wellness. Talk with your health care provider about what schedule of regular examinations is right for you. This is a good chance for you to check in with your provider about disease prevention and staying healthy. In between checkups, there are plenty of things you can do on your own. Experts have done a lot of research about which lifestyle changes and preventive measures are most likely to keep you healthy. Ask your health care provider for more information. Weight and diet Eat a healthy diet  Be sure to include plenty of vegetables, fruits, low-fat dairy products, and lean protein.  Do not eat a lot of foods high in solid fats, added sugars, or salt.  Get regular exercise. This is one of the most important things you can do for your health. ? Most adults should exercise for at least 150 minutes each week. The exercise should increase your heart rate and make you sweat (moderate-intensity exercise). ? Most adults should also do strengthening exercises at least twice a week. This is in addition to the moderate-intensity exercise.  Maintain a healthy weight  Body mass index (BMI) is a measurement that can be used to identify possible weight problems. It estimates body fat based on height and weight. Your health care provider can help determine your BMI and help you achieve or maintain a healthy weight.  For females 69 years of  age and older: ? A BMI below 18.5 is considered underweight. ? A BMI of 18.5 to 24.9 is normal. ? A BMI of 25 to 29.9 is considered overweight. ? A BMI of 30 and above is considered obese.  Watch levels of cholesterol and blood lipids  You should start having your blood tested for lipids and cholesterol at 54 years of age, then have this test every 5 years.  You may need to have your cholesterol levels checked more often if: ? Your lipid or cholesterol levels are high. ? You are older than 54 years of age. ? You are at high risk for heart disease.  Cancer screening Lung Cancer  Lung cancer screening is recommended for adults 61-83 years old who are at high risk for lung cancer because of a history of smoking.  A yearly low-dose CT scan of the lungs is recommended for people who: ? Currently smoke. ? Have quit within the past 15 years. ? Have at least a 30-pack-year history of smoking. A pack year is smoking an average of one pack of cigarettes a day for 1 year.  Yearly screening should continue until it has been 15 years since you quit.  Yearly screening should stop if you develop a health problem that would prevent you from having lung cancer treatment.  Breast Cancer  Practice breast self-awareness. This means understanding how your breasts normally appear and feel.  It also means doing regular breast self-exams. Let your health care provider know about any changes, no matter how small.  If you are in your  67s or 64s, you should have a clinical breast exam (CBE) by a health care provider every 1-3 years as part of a regular health exam.  If you are 103 or older, have a CBE every year. Also consider having a breast X-ray (mammogram) every year.  If you have a family history of breast cancer, talk to your health care provider about genetic screening.  If you are at high risk for breast cancer, talk to your health care provider about having an MRI and a mammogram every  year.  Breast cancer gene (BRCA) assessment is recommended for women who have family members with BRCA-related cancers. BRCA-related cancers include: ? Breast. ? Ovarian. ? Tubal. ? Peritoneal cancers.  Results of the assessment will determine the need for genetic counseling and BRCA1 and BRCA2 testing.  Cervical Cancer Your health care provider may recommend that you be screened regularly for cancer of the pelvic organs (ovaries, uterus, and vagina). This screening involves a pelvic examination, including checking for microscopic changes to the surface of your cervix (Pap test). You may be encouraged to have this screening done every 3 years, beginning at age 46.  For women ages 92-65, health care providers may recommend pelvic exams and Pap testing every 3 years, or they may recommend the Pap and pelvic exam, combined with testing for human papilloma virus (HPV), every 5 years. Some types of HPV increase your risk of cervical cancer. Testing for HPV may also be done on women of any age with unclear Pap test results.  Other health care providers may not recommend any screening for nonpregnant women who are considered low risk for pelvic cancer and who do not have symptoms. Ask your health care provider if a screening pelvic exam is right for you.  If you have had past treatment for cervical cancer or a condition that could lead to cancer, you need Pap tests and screening for cancer for at least 20 years after your treatment. If Pap tests have been discontinued, your risk factors (such as having a new sexual partner) need to be reassessed to determine if screening should resume. Some women have medical problems that increase the chance of getting cervical cancer. In these cases, your health care provider may recommend more frequent screening and Pap tests.  Colorectal Cancer  This type of cancer can be detected and often prevented.  Routine colorectal cancer screening usually begins at 54  years of age and continues through 54 years of age.  Your health care provider may recommend screening at an earlier age if you have risk factors for colon cancer.  Your health care provider may also recommend using home test kits to check for hidden blood in the stool.  A small camera at the end of a tube can be used to examine your colon directly (sigmoidoscopy or colonoscopy). This is done to check for the earliest forms of colorectal cancer.  Routine screening usually begins at age 14.  Direct examination of the colon should be repeated every 5-10 years through 54 years of age. However, you may need to be screened more often if early forms of precancerous polyps or small growths are found.  Skin Cancer  Check your skin from head to toe regularly.  Tell your health care provider about any new moles or changes in moles, especially if there is a change in a mole's shape or color.  Also tell your health care provider if you have a mole that is larger than the size of  a pencil eraser.  Always use sunscreen. Apply sunscreen liberally and repeatedly throughout the day.  Protect yourself by wearing long sleeves, pants, a wide-brimmed hat, and sunglasses whenever you are outside.  Heart disease, diabetes, and high blood pressure  High blood pressure causes heart disease and increases the risk of stroke. High blood pressure is more likely to develop in: ? People who have blood pressure in the high end of the normal range (130-139/85-89 mm Hg). ? People who are overweight or obese. ? People who are African American.  If you are 24-37 years of age, have your blood pressure checked every 3-5 years. If you are 2 years of age or older, have your blood pressure checked every year. You should have your blood pressure measured twice-once when you are at a hospital or clinic, and once when you are not at a hospital or clinic. Record the average of the two measurements. To check your blood pressure  when you are not at a hospital or clinic, you can use: ? An automated blood pressure machine at a pharmacy. ? A home blood pressure monitor.  If you are between 2 years and 34 years old, ask your health care provider if you should take aspirin to prevent strokes.  Have regular diabetes screenings. This involves taking a blood sample to check your fasting blood sugar level. ? If you are at a normal weight and have a low risk for diabetes, have this test once every three years after 54 years of age. ? If you are overweight and have a high risk for diabetes, consider being tested at a younger age or more often. Preventing infection Hepatitis B  If you have a higher risk for hepatitis B, you should be screened for this virus. You are considered at high risk for hepatitis B if: ? You were born in a country where hepatitis B is common. Ask your health care provider which countries are considered high risk. ? Your parents were born in a high-risk country, and you have not been immunized against hepatitis B (hepatitis B vaccine). ? You have HIV or AIDS. ? You use needles to inject street drugs. ? You live with someone who has hepatitis B. ? You have had sex with someone who has hepatitis B. ? You get hemodialysis treatment. ? You take certain medicines for conditions, including cancer, organ transplantation, and autoimmune conditions.  Hepatitis C  Blood testing is recommended for: ? Everyone born from [birth year] through 1963. ? Anyone with known risk factors for hepatitis C.  Sexually transmitted infections (STIs)  You should be screened for sexually transmitted infections (STIs) including gonorrhea and chlamydia if: ? You are sexually active and are younger than 54 years of age. ? You are older than 54 years of age and your health care provider tells you that you are at risk for this type of infection. ? Your sexual activity has changed since you were last screened and you are at an increased  risk for chlamydia or gonorrhea. Ask your health care provider if you are at risk.  If you do not have HIV, but are at risk, it may be recommended that you take a prescription medicine daily to prevent HIV infection. This is called pre-exposure prophylaxis (PrEP). You are considered at risk if: ? You are sexually active and do not regularly use condoms or know the HIV status of your partner(s). ? You take drugs by injection. ? You are sexually active with a partner who has  HIV.  Talk with your health care provider about whether you are at high risk of being infected with HIV. If you choose to begin PrEP, you should first be tested for HIV. You should then be tested every 3 months for as long as you are taking PrEP. Pregnancy  If you are premenopausal and you may become pregnant, ask your health care provider about preconception counseling.  If you may become pregnant, take 400 to 800 micrograms (mcg) of folic acid every day.  If you want to prevent pregnancy, talk to your health care provider about birth control (contraception). Osteoporosis and menopause  Osteoporosis is a disease in which the bones lose minerals and strength with aging. This can result in serious bone fractures. Your risk for osteoporosis can be identified using a bone density scan.  If you are 33 years of age or older, or if you are at risk for osteoporosis and fractures, ask your health care provider if you should be screened.  Ask your health care provider whether you should take a calcium or vitamin D supplement to lower your risk for osteoporosis.  Menopause may have certain physical symptoms and risks.  Hormone replacement therapy may reduce some of these symptoms and risks. Talk to your health care provider about whether hormone replacement therapy is right for you. Follow these instructions at home:  Schedule regular health, dental, and eye exams.  Stay current with your immunizations.  Do not use any  tobacco products including cigarettes, chewing tobacco, or electronic cigarettes.  If you are pregnant, do not drink alcohol.  If you are breastfeeding, limit how much and how often you drink alcohol.  Limit alcohol intake to no more than 1 drink per day for nonpregnant women. One drink equals 12 ounces of beer, 5 ounces of wine, or 1 ounces of hard liquor.  Do not use street drugs.  Do not share needles.  Ask your health care provider for help if you need support or information about quitting drugs.  Tell your health care provider if you often feel depressed.  Tell your health care provider if you have ever been abused or do not feel safe at home. This information is not intended to replace advice given to you by your health care provider. Make sure you discuss any questions you have with your health care provider. Document Released: 12/09/2010 Document Revised: 11/01/2015 Document Reviewed: 02/27/2015 Elsevier Interactive Patient Education  [employer].

## 2017-04-13 ENCOUNTER — Telehealth: Payer: Self-pay | Admitting: Nurse Practitioner

## 2017-04-13 NOTE — Telephone Encounter (Signed)
Left vm for the pt to call back for more information.   Charlotte please advise.

## 2017-04-13 NOTE — Telephone Encounter (Addendum)
Patient called back.  Gave patient lab results.  Patient is requesting call back.  States that Melanie [name] prescribed her phentermine and states this made her feel like she was having a heart attack so she quick taking the medication.  Patient is feeling fine now not taking the medication.  Patient uses [facility] or [facility] if after hours.  Patient would also like more information in regard to her liver function test.

## 2017-04-13 NOTE — Telephone Encounter (Signed)
Left vm for the pt to call back.  

## 2017-04-13 NOTE — Telephone Encounter (Signed)
Patient called requesting to speak with you. °

## 2017-04-13 NOTE — Telephone Encounter (Signed)
She should stop medication. Use exercise and diet for weight loss

## 2017-04-14 NOTE — Telephone Encounter (Signed)
Spoke with pt, gave massage below. Pt verbalized understand.

## 2017-04-16 ENCOUNTER — Other Ambulatory Visit: Payer: Self-pay | Admitting: Obstetrics and Gynecology

## 2017-04-16 DIAGNOSIS — Z139 Encounter for screening, unspecified: Secondary | ICD-10-CM

## 2017-04-29 ENCOUNTER — Encounter: Payer: Self-pay | Admitting: Family Medicine

## 2017-04-29 ENCOUNTER — Ambulatory Visit (INDEPENDENT_AMBULATORY_CARE_PROVIDER_SITE_OTHER): Payer: 59 | Admitting: Family Medicine

## 2017-04-29 VITALS — BP 122/82 | HR 81 | Temp 98.5°F | Resp 12 | Ht 62.0 in | Wt 156.4 lb

## 2017-04-29 DIAGNOSIS — J069 Acute upper respiratory infection, unspecified: Secondary | ICD-10-CM | POA: Diagnosis not present

## 2017-04-29 DIAGNOSIS — J45901 Unspecified asthma with (acute) exacerbation: Secondary | ICD-10-CM

## 2017-04-29 MED ORDER — FLUTICASONE PROPIONATE 50 MCG/ACT NA SUSP: 1.0000 | Freq: Two times a day (BID) | NASAL | 0 refills | Status: DC

## 2017-04-29 MED ORDER — METHYLPREDNISOLONE ACETATE 40 MG/ML IJ SUSP
40.0000 mg | Freq: Once | INTRAMUSCULAR | Status: AC
  Administered 2017-04-29: 40 mg via INTRAMUSCULAR

## 2017-04-29 NOTE — Patient Instructions (Addendum)
  Melanie [name] I have seen you today for an acute visit.  A few things to remember from today's visit:   URI, acute - Plan: fluticasone (FLONASE) 50 MCG/ACT nasal spray  Mild asthma with acute exacerbation, unspecified whether persistent   Medications prescribed today are intended for short period of time and will not be refill upon request, a follow up appointment might be necessary to discuss continuation of of treatment if appropriate.   viral infections are self-limited and we treat each symptom depending of severity.  Over the counter medications as decongestants and cold medications usually help, they need to be taken with caution if there is a history of high blood pressure or palpitations. Tylenol and/or Ibuprofen also helps with most symptoms (headache, muscle aching, fever,etc) Plenty of fluids. Honey helps with cough. Steam inhalations helps with runny nose, nasal congestion, and may prevent sinus infections. Cough and nasal congestion could last a few days and sometimes weeks.   Albuterol inh 2 puff every 6 hours for a week then as needed for wheezing or shortness of breath.   Please follow in not any better in 1-2 weeks or if symptoms get worse.   In general please monitor for signs of worsening symptoms and seek immediate medical attention if any concerning.  If symptoms are not resolved in 1-2 weeks you should schedule a follow up appointment with your doctor, before if needed.  I hope you get better soon!

## 2017-04-29 NOTE — Progress Notes (Signed)
ACUTE VISIT  HPI:  Chief Complaint  Patient presents with  . Cough  . Nasal Congestion    Melanie [name] is a 56 y.o.female here today complaining of a day of respiratory symptoms.  Started with sore throat,pressure frontal/temporal headache,and "green mucus sometimes."  Cough  This is a new problem. The current episode started yesterday. The problem has been unchanged. The problem occurs every few hours. The cough is productive of sputum. Associated symptoms include chills, ear congestion, ear pain, headaches, myalgias, nasal congestion, postnasal drip, rhinorrhea, a sore throat and wheezing (Little). Pertinent negatives include no chest pain, eye redness, fever, hemoptysis, rash or shortness of breath. Nothing aggravates the symptoms. The treatment provided mild relief. Her past medical history is significant for asthma and environmental allergies.    No Hx of recent travel. +Sick contact: Co-worker No known insect bite.  Hx of allergies: Yes. Hx of asthma. She is on QVAR 40 mg inh and Albuterol inh as needed. She has not used Albuterol inh in a few days.   OTC medications for this problem: Tylenol yesterday.  Symptoms otherwise stable.    Review of Systems  Constitutional: Positive for chills and fatigue. Negative for activity change, appetite change and fever.  HENT: Positive for congestion, ear pain, postnasal drip, rhinorrhea, sinus pressure and sore throat. Negative for ear discharge, facial swelling, mouth sores, trouble swallowing and voice change.   Eyes: Negative for discharge, redness and itching.  Respiratory: Positive for cough, chest tightness and wheezing (Little). Negative for hemoptysis and shortness of breath.   Cardiovascular: Negative for chest pain.  Gastrointestinal: Negative for abdominal pain, diarrhea, nausea and vomiting.  Musculoskeletal: Positive for myalgias. Negative for back pain, joint swelling and neck pain.  Skin: Negative for  rash.  Allergic/Immunologic: Positive for environmental allergies.  Neurological: Positive for headaches. Negative for syncope and weakness.  Hematological: Negative for adenopathy. Does not bruise/bleed easily.      Current Outpatient Medications on File Prior to Visit  Medication Sig Dispense Refill  . albuterol (PROAIR HFA) 108 (90 Base) MCG/ACT inhaler Inhale 1 puff into the lungs every 6 (six) hours as needed for wheezing or shortness of breath. 1 Inhaler 5  . azelastine (ASTELIN) 0.1 % nasal spray Place 1 spray into both nostrils 2 (two) times daily. 30 mL 5  . beclomethasone (QVAR) 40 MCG/ACT inhaler Inhale 1 puff into the lungs 2 (two) times daily. Yearly physical due in April must see MD for refills 1 Inhaler 3  . cholecalciferol (VITAMIN D) 1000 units tablet Take 2,000 Units by mouth daily.    . Diclofenac Sodium (PENNSAID) 2 % SOLN Place 1 application onto the skin 2 (two) times daily. 1 Bottle 2  . Flaxseed, Linseed, (FLAX SEED OIL) 1000 MG CAPS Take 1,000 mg by mouth.    [name]. ipratropium (ATROVENT) 0.03 % nasal spray Place 2 sprays into both nostrils every 12 (twelve) hours. 30 mL 12  . loratadine (CLARITIN) 10 MG tablet Take 10 mg by mouth daily.    . Multiple Vitamin (MULTIVITAMIN) tablet Take 1 tablet by mouth daily.    . phentermine 15 MG capsule Take 1 capsule (15 mg total) by mouth every morning. 30 capsule 1  . Probiotic Product (PROBIOTIC DAILY PO) Take by mouth.    . rizatriptan (MAXALT-MLT) 10 MG disintegrating tablet Take 1 tablet (10 mg total) by mouth as needed for migraine. May repeat in 2 hours if needed 10 tablet 1  Current Facility-Administered Medications on File Prior to Visit  Medication Dose Route Frequency Provider Last Rate Last Dose  . ipratropium-albuterol (DUONEB) 0.5-2.5 (3) MG/3ML nebulizer solution 3 mL  3 mL Nebulization Once Nche, Bonna Gainsharlotte Lum, NP         Past Medical History:  Diagnosis Date  . Allergy   . Asthma   . Osteoporosis     Allergies  Allergen Reactions  . Kiwi Extract Hives, Shortness Of Breath and Itching  . Latex   . Penicillins   . Sulfonamide Derivatives     Social History   Socioeconomic History  . Marital status: Divorced    Spouse name: None  . Number of children: None  . Years of education: None  . Highest education level: None  Social Needs  . Financial resource strain: None  . Food insecurity - worry: None  . Food insecurity - inability: None  . Transportation needs - medical: None  . Transportation needs - non-medical: None  Occupational History  . None  Tobacco Use  . Smoking status: Never Smoker  . Smokeless tobacco: Never Used  Substance and Sexual Activity  . Alcohol use: Yes    Comment: 2/wk  . Drug use: No  . Sexual activity: None  Other Topics Concern  . None  Social History Narrative   Exercises regularly    Vitals:   04/29/17 1225  BP: 122/82  Pulse: 81  Resp: 12  Temp: 98.5 F (36.9 C)  SpO2: 98%   Body mass index is 28.6 kg/m.   Physical Exam  Nursing note and vitals reviewed. Constitutional: She is oriented to person, place, and time. She appears well-developed. She does not appear ill. No distress.  HENT:  Head: Normocephalic and atraumatic.  Right Ear: Tympanic membrane, external ear and ear canal normal.  Left Ear: External ear and ear canal normal. Tympanic membrane is not erythematous. A middle ear effusion is present.  Nose: Rhinorrhea present. Right sinus exhibits no maxillary sinus tenderness and no frontal sinus tenderness. Left sinus exhibits no maxillary sinus tenderness and no frontal sinus tenderness.  Mouth/Throat: Mucous membranes are normal. Posterior oropharyngeal erythema present. No oropharyngeal exudate or posterior oropharyngeal edema.  Hyperemic nasal mucosa.  Eyes: Conjunctivae are normal.  Neck: No muscular tenderness present. No edema and no erythema present.  Cardiovascular: Normal rate and regular rhythm.  No murmur  heard. Respiratory: Effort normal and breath sounds normal. No stridor. No respiratory distress.  Prolonged expiration.  Lymphadenopathy:       Head (right side): No submandibular adenopathy present.       Head (left side): No submandibular adenopathy present.    She has cervical adenopathy.       Right cervical: Posterior cervical adenopathy present.       Left cervical: Posterior cervical adenopathy present.  Neurological: She is alert and oriented to person, place, and time. She has normal strength.  Skin: Skin is warm. No rash noted. No erythema.  Psychiatric: She has a normal mood and affect. Her speech is normal.  Well groomed, good eye contact.    ASSESSMENT AND PLAN:   Melanie [name] was seen today for cough and nasal congestion.  Diagnoses and all orders for this visit:  URI, acute  Symptoms suggests a viral etiology, symptomatic treatment recommended in this case, monitor for new symptoms. Explained that I do not think abx is needed at this time. Instructed to monitor for signs of complications, clearly instructed about warning signs. I also explained  that cough and nasal congestion can last a few days and sometimes weeks. F/U as needed.   -     fluticasone (FLONASE) 50 MCG/ACT nasal spray; Place 1 spray into both nostrils 2 (two) times daily.  Mild asthma with acute exacerbation, unspecified whether persistent  No wheezing at this time. She does not want to take Prednisone, would prefer injectable steroid. After verbal consent she received DepoMedrol 40 mg IM x 1. Albuterol inh 2 puff every 6 hours for a week then as needed for wheezing or shortness of breath.  Instructed about warning signs and f/u as needed.  -     methylPREDNISolone acetate (DEPO-MEDROL) injection 40 mg     -Ms. Candyce D [place] was advised to seek attention immediately if symptoms worsen or to follow with PCP if symptoms persist or new concerns arise.       Betty G. [place],  MD  [facility]. [place] office.

## 2017-05-07 ENCOUNTER — Ambulatory Visit: Payer: 59 | Admitting: Family Medicine

## 2017-05-08 ENCOUNTER — Encounter: Payer: Self-pay | Admitting: Family Medicine

## 2017-05-08 ENCOUNTER — Ambulatory Visit: Payer: 59 | Admitting: Family Medicine

## 2017-05-08 VITALS — BP 118/90 | HR 94 | Ht 62.0 in

## 2017-05-08 DIAGNOSIS — R0782 Intercostal pain: Secondary | ICD-10-CM | POA: Diagnosis not present

## 2017-05-08 DIAGNOSIS — M999 Biomechanical lesion, unspecified: Secondary | ICD-10-CM

## 2017-05-08 DIAGNOSIS — Z01419 Encounter for gynecological examination (general) (routine) without abnormal findings: Secondary | ICD-10-CM | POA: Diagnosis not present

## 2017-05-08 DIAGNOSIS — Z6830 Body mass index (BMI) 30.0-30.9, adult: Secondary | ICD-10-CM | POA: Diagnosis not present

## 2017-05-08 DIAGNOSIS — Z1151 Encounter for screening for human papillomavirus (HPV): Secondary | ICD-10-CM | POA: Diagnosis not present

## 2017-05-08 NOTE — Assessment & Plan Note (Signed)
Decision today to treat with OMT was based on Physical Exam  After verbal consent patient was treated with HVLA, ME, FPR techniques in cervical, thoracic, rib, lumbar and sacral areas  Patient tolerated the procedure well with improvement in symptoms  Patient given exercises, stretches and lifestyle modifications  See medications in patient instructions if given  Patient will follow up in 4 weeks 

## 2017-05-08 NOTE — Patient Instructions (Addendum)
Good to see you  Feel better  Tessalon pereles for the cough See me again in 3ish weeks \

## 2017-05-08 NOTE — Assessment & Plan Note (Signed)
Patient was likely having exacerbation secondary to all the coughing.  Has responded well to osteopathic manipulation previously.  We discussed icing regimen and home exercises.  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

## 2017-05-08 NOTE — Progress Notes (Signed)
Tawana ScaleZach Arcenia Scarbro D.O. [facility] [address] [place], [place] [address] Phone: [phone]) [phone] Subjective:    I'm seeing this patient by the request  of:    CC: Neck and back pain follow-up  NUU:[id number]:Subjective  Melanie [name] is a 56 y.o. female coming in with complaint of neck and back pain.  Patient recently did have a sickness.  Did have a soft.  Has a history of asthma and had to be on some mild steroids as well as using her inhaler more frequently.  Often cause her to have more increasing back pain and rib pain.  Patient states that over the course the last several days though has been improving slowly.      Past Medical History:  Diagnosis Date  . Allergy   . Asthma   . Osteoporosis    Past Surgical History:  Procedure Laterality Date  . ABDOMINAL HYSTERECTOMY    . APPENDECTOMY    . KNEE SURGERY     Social History   Socioeconomic History  . Marital status: Divorced    Spouse name: Not on file  . Number of children: Not on file  . Years of education: Not on file  . Highest education level: Not on file  Social Needs  . Financial resource strain: Not on file  . Food insecurity - worry: Not on file  . Food insecurity - inability: Not on file  . Transportation needs - medical: Not on file  . Transportation needs - non-medical: Not on file  Occupational History  . Not on file  Tobacco Use  . Smoking status: Never Smoker  . Smokeless tobacco: Never Used  Substance and Sexual Activity  . Alcohol use: Yes    Comment: 2/wk  . Drug use: No  . Sexual activity: Not on file  Other Topics Concern  . Not on file  Social History Narrative   Exercises regularly   Allergies  Allergen Reactions  . Kiwi Extract Hives, Shortness Of Breath and Itching  . Latex   . Penicillins   . Sulfonamide Derivatives    Family History  Problem Relation Age of Onset  . Heart disease Father 7439  . Irregular heart beat Mother   . Stroke Maternal Grandmother   . Heart attack  Maternal Grandfather   . Stroke Paternal Grandmother   . Heart attack Paternal Grandfather   . Heart attack Brother 45     Past medical history, social, surgical and family history all reviewed in electronic medical record.  No pertanent information unless stated regarding to the chief complaint.   Review of Systems:Review of systems updated and as accurate as of 05/08/17  No headache, visual changes, nausea, vomiting, diarrhea, constipation, dizziness, abdominal pain, skin rash, fevers, chills, night sweats, weight loss, swollen lymph nodes, body aches, joint swelling, chest pain, shortness of breath, mood changes.  Positive muscle aches  Objective  There were no vitals taken for this visit. Systems examined below as of 05/08/17   General: No apparent distress alert and oriented x3 mood and affect normal, dressed appropriately.  HEENT: Pupils equal, extraocular movements intact  Respiratory: Patient's speak in full sentences and does not appear short of breath  Cardiovascular: No lower extremity edema, non tender, no erythema  Skin: Warm dry intact with no signs of infection or rash on extremities or on axial skeleton.  Abdomen: Soft nontender  Neuro: Cranial nerves II through XII are intact, neurovascularly intact in all extremities with 2+ DTRs and 2+  pulses.  Lymph: No lymphadenopathy of posterior or anterior cervical chain or axillae bilaterally.  Gait normal with good balance and coordination.  MSK:  Non tender with full range of motion and good stability and symmetric strength and tone of shoulders, elbows, wrist, hip, knee and ankles bilaterally.  Neck: Inspection unremarkable. No palpable stepoffs. Negative Spurling's maneuver. Mild limitation lacking about 5 degrees of extension Grip strength and sensation normal in bilateral hands Strength good C4 to T1 distribution No sensory change to C4 to T1 Negative Hoffman sign bilaterally Reflexes normal  Patient does have some  trapezius spasms bilaterally right greater than left  Osteopathic findings C2 flexed rotated and side bent right C4 flexed rotated and side bent left C6 flexed rotated and side bent left T3 extended rotated and side bent right inhaled third rib T7 extended rotated and side bent left L2 flexed rotated and side bent right Sacrum right on right    Impression and Recommendations:     This case required medical decision making of moderate complexity.      Note: This dictation was prepared with Dragon dictation along with smaller phrase technology. Any transcriptional errors that result from this process are unintentional.

## 2017-05-25 ENCOUNTER — Inpatient Hospital Stay: Admission: RE | Admit: 2017-05-25 | Payer: 59 | Source: Ambulatory Visit

## 2017-05-25 ENCOUNTER — Ambulatory Visit
Admission: RE | Admit: 2017-05-25 | Discharge: 2017-05-25 | Disposition: A | Discharge status: Home / Self Care | Payer: 59 | Source: Ambulatory Visit | Attending: Obstetrics and Gynecology | Admitting: Obstetrics and Gynecology

## 2017-05-25 DIAGNOSIS — Z139 Encounter for screening, unspecified: Secondary | ICD-10-CM

## 2017-05-25 DIAGNOSIS — Z1231 Encounter for screening mammogram for malignant neoplasm of breast: Secondary | ICD-10-CM | POA: Diagnosis not present

## 2017-05-26 ENCOUNTER — Other Ambulatory Visit: Payer: Self-pay | Admitting: Family Medicine

## 2017-05-26 DIAGNOSIS — J069 Acute upper respiratory infection, unspecified: Secondary | ICD-10-CM

## 2017-05-28 ENCOUNTER — Encounter: Payer: Self-pay | Admitting: Family Medicine

## 2017-05-28 ENCOUNTER — Ambulatory Visit: Payer: 59 | Admitting: Family Medicine

## 2017-05-28 VITALS — BP 106/80 | Ht 62.0 in | Wt 158.0 lb

## 2017-05-28 DIAGNOSIS — M999 Biomechanical lesion, unspecified: Secondary | ICD-10-CM

## 2017-05-28 DIAGNOSIS — M542 Cervicalgia: Secondary | ICD-10-CM | POA: Diagnosis not present

## 2017-05-28 NOTE — Assessment & Plan Note (Signed)
Stable overall.  Mild increase in tightness of the trapezius again.  Responded fairly well to osteopathic manipulation.  Encourage patient to work on the posture.  Likely some of it is more stress related.  Follow-up again in 4-6 weeks

## 2017-05-28 NOTE — Patient Instructions (Signed)
Good to see you as always.  Ice is your friend.  Keep it up and I like your trainer See me again in 4 weeks  Happy holidays!

## 2017-05-28 NOTE — Assessment & Plan Note (Signed)
Decision today to treat with OMT was based on Physical Exam  After verbal consent patient was treated with HVLA, ME, FPR techniques in cervical, thoracic, lumbar and sacral areas  Patient tolerated the procedure well with improvement in symptoms  Patient given exercises, stretches and lifestyle modifications  See medications in patient instructions if given  Patient will follow up in 4-6 weeks 

## 2017-05-28 NOTE — Progress Notes (Signed)
Tawana ScaleZach Aliou Mealey D.O. [facility] [address] [place], [place] [address] Phone: [phone]([phone] Subjective:    I'm seeing this patient by the request  of:    CC: Neck and back pain follow-up  MWN:[id number]:Subjective  Melanie [name] is a 56 y.o. female coming in with complaint of pain follow-up.  Has seen patient multiple times.  Patient has responded fairly well to osteopathic manipulation.  Feels like patient's allergies seem to be worsening which causes some mild increase in tightness.  Patient denies any radiation into any of the extremities.  More of a tightness of the lower back.       Past Medical History:  Diagnosis Date  . Allergy   . Asthma   . Osteoporosis    Past Surgical History:  Procedure Laterality Date  . ABDOMINAL HYSTERECTOMY    . APPENDECTOMY    . KNEE SURGERY     Social History   Socioeconomic History  . Marital status: Divorced    Spouse name: Not on file  . Number of children: Not on file  . Years of education: Not on file  . Highest education level: Not on file  Social Needs  . Financial resource strain: Not on file  . Food insecurity - worry: Not on file  . Food insecurity - inability: Not on file  . Transportation needs - medical: Not on file  . Transportation needs - non-medical: Not on file  Occupational History  . Not on file  Tobacco Use  . Smoking status: Never Smoker  . Smokeless tobacco: Never Used  Substance and Sexual Activity  . Alcohol use: Yes    Comment: 2/wk  . Drug use: No  . Sexual activity: Not on file  Other Topics Concern  . Not on file  Social History Narrative   Exercises regularly   Allergies  Allergen Reactions  . Kiwi Extract Hives, Shortness Of Breath and Itching  . Latex   . Penicillins   . Sulfonamide Derivatives    Family History  Problem Relation Age of Onset  . Heart disease Father 7039  . Irregular heart beat Mother   . Stroke Maternal Grandmother   . Heart attack Maternal Grandfather   . Stroke  Paternal Grandmother   . Heart attack Paternal Grandfather   . Heart attack Brother 45  . Breast cancer Neg Hx      Past medical history, social, surgical and family history all reviewed in electronic medical record.  No pertanent information unless stated regarding to the chief complaint.   Review of Systems:Review of systems updated and as accurate as of 05/28/17  No headache, visual changes, nausea, vomiting, diarrhea, constipation, dizziness, abdominal pain, skin rash, fevers, chills, night sweats, weight loss, swollen lymph nodes, body aches, joint swelling,  chest pain, shortness of breath, mood changes.  Positive muscle aches  Objective  Blood pressure 106/80, height 5\' 2"  (1.575 m), weight 158 lb (71.7 kg). Systems examined below as of 05/28/17   General: No apparent distress alert and oriented x3 mood and affect normal, dressed appropriately.  HEENT: Pupils equal, extraocular movements intact  Respiratory: Patient's speak in full sentences and does not appear short of breath  Cardiovascular: No lower extremity edema, non tender, no erythema  Skin: Warm dry intact with no signs of infection or rash on extremities or on axial skeleton.  Abdomen: Soft nontender  Neuro: Cranial nerves II through XII are intact, neurovascularly intact in all extremities with 2+ DTRs and 2+ pulses.  Lymph: No lymphadenopathy of posterior or anterior cervical chain or axillae bilaterally.  Gait normal with good balance and coordination.  MSK:  Non tender with full range of motion and good stability and symmetric strength and tone of shoulders, elbows, wrist, hip, knee and ankles bilaterally.  Neck: Inspection patient does have some mild loss of lordosis.[name]. No palpable stepoffs. Negative Spurling's maneuver. Full neck range of motion Grip strength and sensation normal in bilateral hands Strength good C4 to T1 distribution No sensory change to C4 to T1 Negative Hoffman sign bilaterally Reflexes  normal  increase in tightness of the right trapezius Markable except some tenderness over the right and left sacroiliac joint with mild tightness of the [employer] test  Osteopathic findings C6 flexed rotated and side bent left T3 extended rotated and side bent right inhaled third rib T6 extended rotated and side bent left L2 flexed rotated and side bent right Sacrum right on right     Impression and Recommendations:     This case required medical decision making of moderate complexity.      Note: This dictation was prepared with Dragon dictation along with smaller phrase technology. Any transcriptional errors that result from this process are unintentional.

## 2017-06-11 ENCOUNTER — Encounter: Payer: Self-pay | Admitting: Internal Medicine

## 2017-06-24 NOTE — Progress Notes (Signed)
Tawana ScaleZach Dolly Harbach D.O. [facility] [address] [place], [place] [address] Phone: [phone]([phone] Subjective:    I'm seeing this patient by the request  of:    CC: Right hip pain, back pain  BJY:[id number]:Subjective  Melanie D Pernell [name] is a 57 y.o. female coming in for back pain and right hip pain. She believes that she had bursitis on the left side and states that she thinks she has developed that on the right side. She has been pushing through the pain. She notes pain when she first starts walking but the pain works itself out.  Patient states that this pain is waking her up at night affecting daily activities.  Has changed different activities and that she is doing with her trainer at this time.  Denies any radiation of the leg or any numbness or but when she rested seems to get worse again  This can contribute to increasing back pain.  No radiation of the pain.  More increase in tightness recently.      Past Medical History:  Diagnosis Date  . Allergy   . Asthma   . Osteoporosis    Past Surgical History:  Procedure Laterality Date  . ABDOMINAL HYSTERECTOMY    . APPENDECTOMY    . KNEE SURGERY     Social History   Socioeconomic History  . Marital status: Divorced    Spouse name: None  . Number of children: None  . Years of education: None  . Highest education level: None  Social Needs  . Financial resource strain: None  . Food insecurity - worry: None  . Food insecurity - inability: None  . Transportation needs - medical: None  . Transportation needs - non-medical: None  Occupational History  . None  Tobacco Use  . Smoking status: Never Smoker  . Smokeless tobacco: Never Used  Substance and Sexual Activity  . Alcohol use: Yes    Comment: 2/wk  . Drug use: No  . Sexual activity: None  Other Topics Concern  . None  Social History Narrative   Exercises regularly   Allergies  Allergen Reactions  . Kiwi Extract Hives, Shortness Of Breath and Itching  . Latex   .  Penicillins   . Sulfonamide Derivatives    Family History  Problem Relation Age of Onset  . Heart disease Father 5439  . Irregular heart beat Mother   . Stroke Maternal Grandmother   . Heart attack Maternal Grandfather   . Stroke Paternal Grandmother   . Heart attack Paternal Grandfather   . Heart attack Brother 45  . Breast cancer Neg Hx      Past medical history, social, surgical and family history all reviewed in electronic medical record.  No pertanent information unless stated regarding to the chief complaint.   Review of Systems:Review of systems updated and as accurate as of 06/25/17  No headache, visual changes, nausea, vomiting, diarrhea, constipation, dizziness, abdominal pain, skin rash, fevers, chills, night sweats, weight loss, swollen lymph nodes, body aches, joint swelling, muscle aches, chest pain, shortness of breath, mood changes.   Objective  Blood pressure 122/76, height 5\' 2"  (1.575 m), weight 157 lb (71.2 kg). Systems examined below as of 06/25/17   General: No apparent distress alert and oriented x3 mood and affect normal, dressed appropriately.  HEENT: Pupils equal, extraocular movements intact  Respiratory: Patient's speak in full sentences and does not appear short of breath  Cardiovascular: No lower extremity edema, non tender, no erythema  Skin:  Warm dry intact with no signs of infection or rash on extremities or on axial skeleton.  Abdomen: Soft nontender  Neuro: Cranial nerves II through XII are intact, neurovascularly intact in all extremities with 2+ DTRs and 2+ pulses.  Lymph: No lymphadenopathy of posterior or anterior cervical chain or axillae bilaterally.  Gait normal with good balance and coordination.  MSK:  Non tender with full range of motion and good stability and symmetric strength and tone of shoulders, elbows, wrist, hip, knee and ankles bilaterally.  Back Exam:  Inspection: Unremarkable  Motion: Flexion 45 deg, Extension 25 deg, Side  Bending to 35 deg bilaterally,  Rotation to 35 deg bilaterally  SLR laying: Negative  XSLR laying: Negative  Palpable tenderness: Tenderness over the right greater trochanteric area.  Full range of motion of the hip though noted.[name] FABER: Positive Faber right. Sensory change: Gross sensation intact to all lumbar and sacral dermatomes.  Reflexes: 2+ at both patellar tendons, 2+ at achilles tendons, Babinski's downgoing.  Strength at foot  Plantar-flexion: 5/5 Dorsi-flexion: 5/5 Eversion: 5/5 Inversion: 5/5  Leg strength  Quad: 5/5 Hamstring: 5/5 Hip flexor: 5/5 Hip abductors: 5/5  Gait unremarkable.  Osteopathic findings C2 flexed rotated and side bent right C4 flexed rotated and side bent left C6 flexed rotated and side bent left T3 extended rotated and side bent right inhaled third rib T9 extended rotated and side bent left L2 flexed rotated and side bent right Sacrum right on right   Procedure: Real-time Ultrasound Guided Injection of right greater trochanteric bursitis secondary to patient's body habitus Device: GE Logiq Q7 Ultrasound guided injection is preferred based studies that show increased duration, increased effect, greater accuracy, decreased procedural pain, increased response rate, and decreased cost with ultrasound guided versus blind injection.  Verbal informed consent obtained.  Time-out conducted.  Noted no overlying erythema, induration, or other signs of local infection.  Skin prepped in a sterile fashion.  Local anesthesia: Topical Ethyl chloride.  With sterile technique and under real time ultrasound guidance:  Greater trochanteric area was visualized and patient's bursa was noted. A 22-gauge 3 inch needle was inserted and 4 cc of 0.5% Marcaine and 1 cc of Kenalog 40 mg/dL was injected. Pictures taken Completed without difficulty  Pain immediately resolved suggesting accurate placement of the medication.  Advised to call if fevers/chills, erythema, induration,  drainage, or persistent bleeding.  Images permanently stored and available for review in the ultrasound unit.  Impression: Technically successful ultrasound guided injection.   Impression and Recommendations:     This case required medical decision making of moderate complexity.      Note: This dictation was prepared with Dragon dictation along with smaller phrase technology. Any transcriptional errors that result from this process are unintentional.

## 2017-06-25 ENCOUNTER — Ambulatory Visit: Payer: 59 | Admitting: Family Medicine

## 2017-06-25 ENCOUNTER — Encounter: Payer: Self-pay | Admitting: Family Medicine

## 2017-06-25 ENCOUNTER — Ambulatory Visit: Payer: Self-pay

## 2017-06-25 VITALS — BP 122/76 | Ht 62.0 in | Wt 157.0 lb

## 2017-06-25 DIAGNOSIS — M25551 Pain in right hip: Secondary | ICD-10-CM

## 2017-06-25 DIAGNOSIS — M999 Biomechanical lesion, unspecified: Secondary | ICD-10-CM

## 2017-06-25 DIAGNOSIS — M5442 Lumbago with sciatica, left side: Secondary | ICD-10-CM

## 2017-06-25 DIAGNOSIS — M7061 Trochanteric bursitis, right hip: Secondary | ICD-10-CM | POA: Diagnosis not present

## 2017-06-25 NOTE — Assessment & Plan Note (Signed)
Injected today/.  HEP given  Discussed which activities todo and which one to avoid.  HEP given  RTC in 4 weeks

## 2017-06-25 NOTE — Assessment & Plan Note (Signed)
Stable overall.  Responded fairly well to osteopathic manipulation, we discussed icing regimen and home exercises.  Discussed which activities to do which wants to avoid.  Increase activity slowly over the course the next several days.  Follow-up with me again 4 weeks

## 2017-06-25 NOTE — Assessment & Plan Note (Signed)
Decision today to treat with OMT was based on Physical Exam  After verbal consent patient was treated with HVLA, ME, FPR techniques in cervical, thoracic, rib, lumbar and sacral areas  Patient tolerated the procedure well with improvement in symptoms  Patient given exercises, stretches and lifestyle modifications  See medications in patient instructions if given  Patient will follow up in 4 weeks 

## 2017-06-25 NOTE — Patient Instructions (Signed)
Good to see you  Ice maybe the hip after working out Have them work on hip abductors a little more  Vitamin D up ot 4000-5000IU dialy  See me again in 4 weeks

## 2017-07-09 NOTE — Progress Notes (Signed)
Subjective:    Patient ID: Melanie [name], female    DOB: 1960/07/22, 57 y.o.   MRN: [id number]  HPI She is here for an acute visit for ear symptoms.  Her symptoms started with cold symptoms 1 week ago.  The cold symptoms improved and most are gone.  She is left with left ear pain.  She often has left ear symptoms after cold/sinus infections.  Her left ear hurts.  She has had vertigo with looking left.  The ear feels full.  She has had some swelling in her left neck which has improved.  She has had some headaches. She denies change in her hearing, fever, sinus pain or nasal congestion.     Medications and allergies reviewed with patient and updated if appropriate.  Patient Active Problem List   Diagnosis Date Noted  . Greater trochanteric bursitis, right 06/25/2017  . Migraine headache 02/17/2017  . Intercostal pain 02/03/2017  . Polyarthralgia 08/23/2015  . Asthma with acute exacerbation 02/28/2015  . Quadriceps tendinitis 11/16/2014  . Neck pain 07/07/2014  . Concussion with no loss of consciousness 07/07/2014  . Nonallopathic lesion of lumbosacral region 11/04/2013  . Nonallopathic lesion of thoracic region 11/04/2013  . Piriformis syndrome of left side 10/14/2013  . Greater trochanteric bursitis of left hip 10/14/2013  . Trapezius muscle spasm 11/26/2012  . Nonallopathic lesion of cervicothoracic region 11/26/2012  . Seasonal allergies 09/28/2012  . Asthma attack 12/01/2011  . Backache 09/26/2009  . OTHER ACQUIRED DEFORMITY OF ANKLE AND FOOT OTHER 09/26/2009  . ABNORMALITY OF GAIT 09/26/2009  . KNEE PAIN, RIGHT 08/08/2009  . TENDINITIS, PATELLAR 08/08/2009    Current Outpatient Medications on File Prior to Visit  Medication Sig Dispense Refill  . albuterol (PROAIR HFA) 108 (90 Base) MCG/ACT inhaler Inhale 1 puff into the lungs every 6 (six) hours as needed for wheezing or shortness of breath. 1 Inhaler 5  . azelastine (ASTELIN) 0.1 % nasal spray Place 1 spray into  both nostrils 2 (two) times daily. 30 mL 5  . beclomethasone (QVAR) 40 MCG/ACT inhaler Inhale 1 puff into the lungs 2 (two) times daily. Yearly physical due in April must see MD for refills 1 Inhaler 3  . cholecalciferol (VITAMIN D) 1000 units tablet Take 2,000 Units by mouth daily.    . Diclofenac Sodium (PENNSAID) 2 % SOLN Place 1 application onto the skin 2 (two) times daily. 1 Bottle 2  . Flaxseed, Linseed, (FLAX SEED OIL) 1000 MG CAPS Take 1,000 mg by mouth.    . fluticasone (FLONASE) 50 MCG/ACT nasal spray USE 1 SPRAY INTO BOTH NOSTRILS 2 TIMES DAILY. 16 g 0  . ipratropium (ATROVENT) 0.03 % nasal spray Place 2 sprays into both nostrils every 12 (twelve) hours. 30 mL 12  . loratadine (CLARITIN) 10 MG tablet Take 10 mg by mouth daily.    . Multiple Vitamin (MULTIVITAMIN) tablet Take 1 tablet by mouth daily.    . phentermine 15 MG capsule Take 1 capsule (15 mg total) by mouth every morning. 30 capsule 1  . Probiotic Product (PROBIOTIC DAILY PO) Take by mouth.    . rizatriptan (MAXALT-MLT) 10 MG disintegrating tablet Take 1 tablet (10 mg total) by mouth as needed for migraine. May repeat in 2 hours if needed 10 tablet 1   Current Facility-Administered Medications on File Prior to Visit  Medication Dose Route Frequency Provider Last Rate Last Dose  . ipratropium-albuterol (DUONEB) 0.5-2.5 (3) MG/3ML nebulizer solution 3 mL  3 mL Nebulization  Once Nche, Bonna Gainsharlotte Lum, NP        Past Medical History:  Diagnosis Date  . Allergy   . Asthma   . Osteoporosis     Past Surgical History:  Procedure Laterality Date  . ABDOMINAL HYSTERECTOMY    . APPENDECTOMY    . KNEE SURGERY      Social History   Socioeconomic History  . Marital status: Divorced    Spouse name: None  . Number of children: None  . Years of education: None  . Highest education level: None  Social Needs  . Financial resource strain: None  . Food insecurity - worry: None  . Food insecurity - inability: None  .  Transportation needs - medical: None  . Transportation needs - non-medical: None  Occupational History  . None  Tobacco Use  . Smoking status: Never Smoker  . Smokeless tobacco: Never Used  Substance and Sexual Activity  . Alcohol use: Yes    Comment: 2/wk  . Drug use: No  . Sexual activity: None  Other Topics Concern  . None  Social History Narrative   Exercises regularly    Family History  Problem Relation Age of Onset  . Heart disease Father 339  . Irregular heart beat Mother   . Stroke Maternal Grandmother   . Heart attack Maternal Grandfather   . Stroke Paternal Grandmother   . Heart attack Paternal Grandfather   . Heart attack Brother 45  . Breast cancer Neg Hx     Review of Systems  Constitutional: Negative for chills and fever.  HENT: Positive for ear pain (pressure, fullness). Negative for congestion, hearing loss and sore throat.   Respiratory: Negative for cough, shortness of breath and wheezing.   Neurological: Positive for dizziness. Negative for headaches.       Objective:   Vitals:   07/10/17 0904  BP: 122/72  Pulse: 79  Resp: 16  Temp: 98 F (36.7 C)  SpO2: 96%   Filed Weights   07/10/17 0904  Weight: 156 lb (70.8 kg)   Body mass index is 28.53 kg/m.  Wt Readings from Last 3 Encounters:  07/10/17 156 lb (70.8 kg)  06/25/17 157 lb (71.2 kg)  05/28/17 158 lb (71.7 kg)     Physical Exam GENERAL APPEARANCE: Appears stated age, well appearing, NAD EYES: conjunctiva clear, no icterus HEENT: bilateral tympanic membranes and ear canals normal, oropharynx with no erythema, no thyromegaly, trachea midline, no cervical or supraclavicular lymphadenopathy LUNGS: Clear to auscultation without wheeze or crackles, unlabored breathing, good air entry bilaterally CARDIOVASCULAR: Normal S1,S2 without murmurs, no edema SKIN: warm, dry        Assessment & Plan:   See Problem List for Assessment and Plan of chronic medical problems.

## 2017-07-10 ENCOUNTER — Encounter: Payer: Self-pay | Admitting: Internal Medicine

## 2017-07-10 ENCOUNTER — Ambulatory Visit: Payer: 59 | Admitting: Internal Medicine

## 2017-07-10 VITALS — BP 122/72 | HR 79 | Temp 98.0°F | Resp 16 | Wt 156.0 lb

## 2017-07-10 DIAGNOSIS — H6982 Other specified disorders of Eustachian tube, left ear: Secondary | ICD-10-CM

## 2017-07-10 NOTE — Assessment & Plan Note (Signed)
Normal ear exam Symptoms consistent with eustachian tube dysfunction on the left Device Flonase, use saline nasal spray to prevent dryness Unable to tolerate Sudafed If symptoms do not improve over the next several days she will let me know and we can consider oral steroids

## 2017-07-10 NOTE — Patient Instructions (Signed)
Start using flonase regularly for your ear pressure/pain.  You can also take an oral allergy medication.  Use saline nasal spray to prevent the dryness.  Call if no improvement and we can consider oral steroids.     Eustachian Tube Dysfunction The eustachian tube connects the middle ear to the back of the nose. It regulates air pressure in the middle ear by allowing air to move between the ear and nose. It also helps to drain fluid from the middle ear space. When the eustachian tube does not function properly, air pressure, fluid, or both can build up in the middle ear. Eustachian tube dysfunction can affect one or both ears. What are the causes? This condition happens when the eustachian tube becomes blocked or cannot open normally. This may result from:  Ear infections.  Colds and other upper respiratory infections.  Allergies.  Irritation, such as from cigarette smoke or acid from the stomach coming up into the esophagus (gastroesophageal reflux).  Sudden changes in air pressure, such as from descending in an airplane.  Abnormal growths in the nose or throat, such as nasal polyps, tumors, or enlarged tissue at the back of the throat (adenoids).  What increases the risk? This condition may be more likely to develop in people who smoke and people who are overweight. Eustachian tube dysfunction may also be more likely to develop in children, especially children who have:  Certain birth defects of the mouth, such as cleft palate.  Large tonsils and adenoids.  What are the signs or symptoms? Symptoms of this condition may include:  A feeling of fullness in the ear.  Ear pain.  Clicking or popping noises in the ear.  Ringing in the ear.  Hearing loss.  Loss of balance.  Symptoms may get worse when the air pressure around you changes, such as when you travel to an area of high elevation or fly on an airplane. How is this diagnosed? This condition may be diagnosed based  on:  Your symptoms.  A physical exam of your ear, nose, and throat.  Tests, such as those that measure: ? The movement of your eardrum (tympanogram). ? Your hearing (audiometry).  How is this treated? Treatment depends on the cause and severity of your condition. If your symptoms are mild, you may be able to relieve your symptoms by moving air into ("popping") your ears. If you have symptoms of fluid in your ears, treatment may include:  Decongestants.  Antihistamines.  Nasal sprays or ear drops that contain medicines that reduce swelling (steroids).  In some cases, you may need to have a procedure to drain the fluid in your eardrum (myringotomy). In this procedure, a small tube is placed in the eardrum to:  Drain the fluid.  Restore the air in the middle ear space.  Follow these instructions at home:  Take over-the-counter and prescription medicines only as told by your health care provider.  Use techniques to help pop your ears as recommended by your health care provider. These may include: ? Chewing gum. ? Yawning. ? Frequent, forceful swallowing. ? Closing your mouth, holding your nose closed, and gently blowing as if you are trying to blow air out of your nose.  Do not do any of the following until your health care provider approves: ? Travel to high altitudes. ? Fly in airplanes. ? Work in a Estate agentpressurized cabin or room. ? Scuba dive.  Keep your ears dry. Dry your ears completely after showering or bathing.  Do not  smoke.  Keep all follow-up visits as told by your health care provider. This is important. Contact a health care provider if:  Your symptoms do not go away after treatment.  Your symptoms come back after treatment.  You are unable to pop your ears.  You have: ? A fever. ? Pain in your ear. ? Pain in your head or neck. ? Fluid draining from your ear.  Your hearing suddenly changes.  You become very dizzy.  You lose your balance. This  information is not intended to replace advice given to you by your health care provider. Make sure you discuss any questions you have with your health care provider. Document Released: 06/22/2015 Document Revised: 11/01/2015 Document Reviewed: 06/14/2014 Elsevier Interactive Patient Education  [employer].

## 2017-07-22 NOTE — Progress Notes (Signed)
Tawana ScaleZach Smith D.O. [facility] [address] [place], [place] [address] Phone: [phone] Subjective:      CC: Right hip and back pain  BJY:NWGNFAOZHYHPI:Subjective  Melanie [name] is a [age] y.o. female coming in with complaint of right hip pain. She said she might have made it flare up with yoga. She was barely able to put weight on her leg this morning. She stretched in her office earlier and felt a pop and is now able to walk better. Her pain is in the superior portion of the glute.  Has had many different musculoskeletal complaints over time.  Has responded well to osteopathic manipulation previously.     Past Medical History:  Diagnosis Date  . Allergy   . Asthma   . Osteoporosis    Past Surgical History:  Procedure Laterality Date  . ABDOMINAL HYSTERECTOMY    . APPENDECTOMY    . KNEE SURGERY     Social History   Socioeconomic History  . Marital status: Divorced    Spouse name: None  . Number of children: None  . Years of education: None  . Highest education level: None  Social Needs  . Financial resource strain: None  . Food insecurity - worry: None  . Food insecurity - inability: None  . Transportation needs - medical: None  . Transportation needs - non-medical: None  Occupational History  . None  Tobacco Use  . Smoking status: Never Smoker  . Smokeless tobacco: Never Used  Substance and Sexual Activity  . Alcohol use: Yes    Comment: 2/wk  . Drug use: No  . Sexual activity: None  Other Topics Concern  . None  Social History Narrative   Exercises regularly   Allergies  Allergen Reactions  . Kiwi Extract Hives, Shortness Of Breath and Itching  . Latex   . Penicillins   . Sulfonamide Derivatives    Family History  Problem Relation Age of Onset  . Heart disease Father [id number]  . Irregular heart beat Mother   . Stroke Maternal Grandmother   . Heart attack Maternal Grandfather   . Stroke Paternal Grandmother   . Heart attack Paternal Grandfather   .  Heart attack Brother 45  . Breast cancer Neg Hx      Past medical history, social, surgical and family history all reviewed in electronic medical record.  No pertanent information unless stated regarding to the chief complaint.   Review of Systems:Review of systems updated and as accurate as of 07/23/17  No headache, visual changes, nausea, vomiting, diarrhea, constipation, dizziness, abdominal pain, skin rash, fevers, chills, night sweats, weight loss, swollen lymph nodes, body aches, joint swelling,  chest pain, shortness of breath, mood changes.  Positive muscle aches  Objective  Blood pressure 124/72, pulse 83, height 5\' 2"  (1.575 m), weight 156 lb (70.8 kg), SpO2 99 %. Systems examined below as of 07/23/17   General: No apparent distress alert and oriented x3 mood and affect normal, dressed appropriately.  HEENT: Pupils equal, extraocular movements intact  Respiratory: Patient's speak in full sentences and does not appear short of breath  Cardiovascular: No lower extremity edema, non tender, no erythema  Skin: Warm dry intact with no signs of infection or rash on extremities or on axial skeleton.  Abdomen: Soft nontender  Neuro: Cranial nerves II through XII are intact, neurovascularly intact in all extremities with 2+ DTRs and 2+ pulses.  Lymph: No lymphadenopathy of posterior or anterior cervical chain or axillae bilaterally.  Gait normal with good balance and coordination.  MSK:  Non tender with full range of motion and good stability and symmetric strength and tone of shoulders, elbows, wrist, hip, knee and ankles bilaterally.  Back Exam:  Inspection: Mild loss of lordosis Motion: Flexion 40 deg, Extension 25 deg, Side Bending to 35 deg bilaterally,  Rotation to 35 deg bilaterally  SLR laying: Negative  XSLR laying: Negative  Palpable tenderness: Tender to palpation over the lateral aspect of the right hip.  Over the sacroiliac joint as well  FABER: Positive right. Sensory  change: Gross sensation intact to all lumbar and sacral dermatomes.  Reflexes: 2+ at both patellar tendons, 2+ at achilles tendons, Babinski's downgoing.  Strength at foot  Plantar-flexion: 5/5 Dorsi-flexion: 5/5 Eversion: 5/5 Inversion: 5/5  Leg strength  Quad: 5/5 Hamstring: 5/5 Hip flexor: 5/5 Hip abductors: 5/5  Gait unremarkable.  Osteopathic findings C6 flexed rotated and side bent left T3 extended rotated and side bent right inhaled third rib T6 extended rotated and side bent left L2 flexed rotated and side bent right Sacrum right on right     Impression and Recommendations:     This case required medical decision making of moderate complexity.      Note: This dictation was prepared with Dragon dictation along with smaller phrase technology. Any transcriptional errors that result from this process are unintentional.

## 2017-07-23 ENCOUNTER — Encounter: Payer: Self-pay | Admitting: Family Medicine

## 2017-07-23 ENCOUNTER — Ambulatory Visit: Payer: 59 | Admitting: Family Medicine

## 2017-07-23 VITALS — BP 124/72 | HR 83 | Ht 62.0 in | Wt 156.0 lb

## 2017-07-23 DIAGNOSIS — M999 Biomechanical lesion, unspecified: Secondary | ICD-10-CM | POA: Diagnosis not present

## 2017-07-23 DIAGNOSIS — M7061 Trochanteric bursitis, right hip: Secondary | ICD-10-CM | POA: Diagnosis not present

## 2017-07-23 NOTE — Assessment & Plan Note (Signed)
Decision today to treat with OMT was based on Physical Exam  After verbal consent patient was treated with HVLA, ME, FPR techniques in cervical, thoracic, lumbar and sacral areas  Patient tolerated the procedure well with improvement in symptoms  Patient given exercises, stretches and lifestyle modifications  See medications in patient instructions if given  Patient will follow up in 4 weeks 

## 2017-07-23 NOTE — Patient Instructions (Signed)
Good to see you  Gustavus Bryantce is your friend. Ice 20 minutes 2 times daily. Usually after activity and before bed. When hurting. Bend knee and hip 90 degrees and pull to opposite shoulder  No extensions of the back and be careful  See me again in 2-3 weeks if more pain otherwise move it to 4 weeks.

## 2017-07-23 NOTE — Assessment & Plan Note (Signed)
Patient still has tenderness in the lateral aspect of the hip and likely some more underlying sacroiliac joint.  Given home exercises, icing regimen, discussed over-the-counter medications.  He says that he topical anti-inflammatories and icing regimen.

## 2017-08-19 NOTE — Progress Notes (Signed)
[name] [address] [place], [place] [address] Phone: [phone] Subjective:       CC: Neck pain and back pain follow-up  OZH:[id number]  Analyssa D [name] is a 57 y.o. female coming in with complaint of neck pain follow-up.  Has responded fairly well to osteopathic manipulation.  Patient was having right-sided greater trochanteric pain at last exam.  Was given an injection June 25, 2017 and seemed to be doing better.  Patient states that she has stiffness in her neck bilaterally.     Past Medical History:  Diagnosis Date  . Allergy   . Asthma   . Osteoporosis    Past Surgical History:  Procedure Laterality Date  . ABDOMINAL HYSTERECTOMY    . APPENDECTOMY    . KNEE SURGERY     Social History   Socioeconomic History  . Marital status: Divorced    Spouse name: None  . Number of children: None  . Years of education: None  . Highest education level: None  Social Needs  . Financial resource strain: None  . Food insecurity - worry: None  . Food insecurity - inability: None  . Transportation needs - medical: None  . Transportation needs - non-medical: None  Occupational History  . None  Tobacco Use  . Smoking status: Never Smoker  . Smokeless tobacco: Never Used  Substance and Sexual Activity  . Alcohol use: Yes    Comment: 2/wk  . Drug use: No  . Sexual activity: None  Other Topics Concern  . None  Social History Narrative   Exercises regularly   Allergies  Allergen Reactions  . Kiwi Extract Hives, Shortness Of Breath and Itching  . Latex   . Penicillins   . Sulfonamide Derivatives    Family History  Problem Relation Age of Onset  . Heart disease Father 52  . Irregular heart beat Mother   . Stroke Maternal Grandmother   . Heart attack Maternal Grandfather   . Stroke Paternal Grandmother   . Heart attack Paternal Grandfather   . Heart attack Brother 45  . Breast cancer Neg Hx      Past medical history, social,  surgical and family history all reviewed in electronic medical record.  No pertanent information unless stated regarding to the chief complaint.   Review of Systems:Review of systems updated and as accurate as of 08/20/17  No headache, visual changes, nausea, vomiting, diarrhea, constipation, dizziness, abdominal pain, skin rash, fevers, chills, night sweats, weight loss, swollen lymph nodes, body aches, joint swelling, muscle aches, chest pain, shortness of breath, mood changes.   Objective  Blood pressure 130/74, height 5\' 2"  (1.575 m), weight 156 lb (70.8 kg). Systems examined below as of 08/20/17   General: No apparent distress alert and oriented x3 mood and affect normal, dressed appropriately.  HEENT: Pupils equal, extraocular movements intact  Respiratory: Patient's speak in full sentences and does not appear short of breath  Cardiovascular: No lower extremity edema, non tender, no erythema  Skin: Warm dry intact with no signs of infection or rash on extremities or on axial skeleton.  Abdomen: Soft nontender  Neuro: Cranial nerves II through XII are intact, neurovascularly intact in all extremities with 2+ DTRs and 2+ pulses.  Lymph: No lymphadenopathy of posterior or anterior cervical chain or axillae bilaterally.  Gait normal with good balance and coordination.  MSK:  Non tender with full range of motion and good stability and symmetric strength and tone of shoulders,  elbows, wrist, hip, knee and ankles bilaterally.  Neck: Inspection loss of lordosis. No palpable stepoffs. Negative Spurling's maneuver. Patient has very minimal decreased rotation to the right Grip strength and sensation normal in bilateral hands Strength good C4 to T1 distribution No sensory change to C4 to T1 Negative Hoffman sign bilaterally Reflexes normal Positive tenderness in the right trapezius  Osteopathic findings C2 flexed rotated and side bent right C7 flexed rotated and side bent left T3 extended  rotated and side bent right inhaled third rib T6 extended rotated and side bent left L2 flexed rotated and side bent right Sacrum right on right      Impression and Recommendations:     This case required medical decision making of moderate complexity.      Note: This dictation was prepared with Dragon dictation along with smaller phrase technology. Any transcriptional errors that result from this process are unintentional.

## 2017-08-20 ENCOUNTER — Ambulatory Visit: Payer: 59 | Admitting: Family Medicine

## 2017-08-20 ENCOUNTER — Encounter: Payer: Self-pay | Admitting: Family Medicine

## 2017-08-20 VITALS — BP 130/74 | Ht 62.0 in | Wt 156.0 lb

## 2017-08-20 DIAGNOSIS — M999 Biomechanical lesion, unspecified: Secondary | ICD-10-CM

## 2017-08-20 DIAGNOSIS — M542 Cervicalgia: Secondary | ICD-10-CM

## 2017-08-20 NOTE — Patient Instructions (Signed)
You are GREAT!!!!! I am so impressed.  Whatever it is keep it up  Lets space it out to 5 weeks

## 2017-08-20 NOTE — Assessment & Plan Note (Signed)
Patient is doing better at this time.  Discussed icing regimen and home exercises.  Discussed which activity to doing which wants to avoid.  Patient was to increase activity slowly over the course the next several days.  Patient will follow up with me again in 4-6 weeks.

## 2017-08-20 NOTE — Assessment & Plan Note (Signed)
Decision today to treat with OMT was based on Physical Exam  After verbal consent patient was treated with HVLA, ME, FPR techniques in cervical, thoracic, lumbar and sacral areas  Patient tolerated the procedure well with improvement in symptoms  Patient given exercises, stretches and lifestyle modifications  See medications in patient instructions if given  Patient will follow up in 4-6 weeks 

## 2017-09-16 ENCOUNTER — Encounter: Payer: Self-pay | Admitting: Physician Assistant

## 2017-09-24 ENCOUNTER — Encounter: Payer: Self-pay | Admitting: Family Medicine

## 2017-09-24 ENCOUNTER — Ambulatory Visit: Payer: 59 | Admitting: Family Medicine

## 2017-09-24 VITALS — BP 122/72 | HR 87 | Ht 62.0 in | Wt 157.0 lb

## 2017-09-24 DIAGNOSIS — M999 Biomechanical lesion, unspecified: Secondary | ICD-10-CM | POA: Diagnosis not present

## 2017-09-24 DIAGNOSIS — G43701 Chronic migraine without aura, not intractable, with status migrainosus: Secondary | ICD-10-CM

## 2017-09-24 NOTE — Assessment & Plan Note (Signed)
Decision today to treat with OMT was based on Physical Exam  After verbal consent patient was treated with HVLA, ME, FPR techniques in cervical, thoracic, lumbar and sacral areas  Patient tolerated the procedure well with improvement in symptoms  Patient given exercises, stretches and lifestyle modifications  See medications in patient instructions if given  Patient will follow up in 4-8 weeks 

## 2017-09-24 NOTE — Patient Instructions (Signed)
Good to see you  Melanie [name] is your friend.  Happy Easter  Stay active  Tylenol maybe daily for the pain to avoid the migraine See me again 4 weeks

## 2017-09-24 NOTE — Progress Notes (Signed)
Tawana ScaleZach Teoman Giraud [name].O. [facility] [address] [place], [place] [address] Phone: [phone] Subjective:     CC: Neck and back pain follow-up  BJY:NWGNFAOZHYHPI:Subjective  Melanie [name] Pernell Dupredams is a 57 y.o. female coming in with complaint of neck and back pain.  Patient states doing relatively well overall.  Some mild tightness here and there.  Working on a more regular basis.  Has responded well to manipulation previously.  Feels like she is doing decent.     Past Medical History:  Diagnosis Date  . Allergy   . Asthma   . Osteoporosis    Past Surgical History:  Procedure Laterality Date  . ABDOMINAL HYSTERECTOMY    . APPENDECTOMY    . KNEE SURGERY     Social History   Socioeconomic History  . Marital status: Divorced    Spouse name: Not on file  . Number of children: Not on file  . Years of education: Not on file  . Highest education level: Not on file  Occupational History  . Not on file  Social Needs  . Financial resource strain: Not on file  . Food insecurity:    Worry: Not on file    Inability: Not on file  . Transportation needs:    Medical: Not on file    Non-medical: Not on file  Tobacco Use  . Smoking status: Never Smoker  . Smokeless tobacco: Never Used  Substance and Sexual Activity  . Alcohol use: Yes    Comment: 2/wk  . Drug use: No  . Sexual activity: Not on file  Lifestyle  . Physical activity:    Days per week: Not on file    Minutes per session: Not on file  . Stress: Not on file  Relationships  . Social connections:    Talks on phone: Not on file    Gets together: Not on file    Attends religious service: Not on file    Active member of club or organization: Not on file    Attends meetings of clubs or organizations: Not on file    Relationship status: Not on file  Other Topics Concern  . Not on file  Social History Narrative   Exercises regularly   Allergies  Allergen Reactions  . Kiwi Extract Hives, Shortness Of Breath and Itching  .  Latex   . Penicillins   . Sulfonamide Derivatives    Family History  Problem Relation Age of Onset  . Heart disease Father 6639  . Irregular heart beat Mother   . Stroke Maternal Grandmother   . Heart attack Maternal Grandfather   . Stroke Paternal Grandmother   . Heart attack Paternal Grandfather   . Heart attack Brother 45  . Breast cancer Neg Hx      Past medical history, social, surgical and family history all reviewed in electronic medical record.  No pertanent information unless stated regarding to the chief complaint.   Review of Systems:Review of systems updated and as accurate as of 09/24/17  No visual changes, nausea, vomiting, diarrhea, constipation, dizziness, abdominal pain, skin rash, fevers, chills, night sweats, weight loss, swollen lymph nodes, body aches, joint swelling, chest pain, shortness of breath, mood changes.  Mild positive headaches and muscle aches  Objective  Blood pressure 122/72, pulse 87, height 5\' 2"  (1.575 m), weight 157 lb (71.2 kg), SpO2 96 %. Systems examined below as of 09/24/17   General: No apparent distress alert and oriented x3 mood and affect normal, dressed  appropriately.  HEENT: Pupils equal, extraocular movements intact  Respiratory: Patient's speak in full sentences and does not appear short of breath  Cardiovascular: No lower extremity edema, non tender, no erythema  Skin: Warm dry intact with no signs of infection or rash on extremities or on axial skeleton.  Abdomen: Soft nontender  Neuro: Cranial nerves II through XII are intact, neurovascularly intact in all extremities with 2+ DTRs and 2+ pulses.  Lymph: No lymphadenopathy of posterior or anterior cervical chain or axillae bilaterally.  Gait normal with good balance and coordination.  MSK:  Non tender with full range of motion and good stability and symmetric strength and tone of shoulders, elbows, wrist, hip, knee and ankles bilaterally.  Neck exam shows some mild limitation in  all planes by about 5 degrees.  Patient does have some tenderness to palpation on the left side of the neck.  Negative Spurling's.  Discussed icing regimen and home exercises.   Back exam shows some tightness bilaterally.  Patient does have mild positive Pearlean Brownie on the left side.  Osteopathic findings C2 flexed rotated and side bent right C4 flexed rotated and side bent left T3 extended rotated and side bent right inhaled third rib T9 extended rotated and side bent right  L2 flexed rotated and side bent right Sacrum right on right     Impression and Recommendations:     This case required medical decision making of moderate complexity.      Note: This dictation was prepared with Dragon dictation along with smaller phrase technology. Any transcriptional errors that result from this process are unintentional.

## 2017-09-24 NOTE — Assessment & Plan Note (Signed)
Patient's migraine headaches seem to be cervicogenic in nature.  We discussed about neck pain.  Discussed posture and ergonomics.  Has been doing relatively well with conservative therapy and manipulation.  Follow-up again in 4-6 weeks

## 2017-10-28 NOTE — Progress Notes (Signed)
[name] [address] [place], [place] [address] Phone: [phone] Subjective:     CC: Back pain  LKG:[id number]  Melanie [name] is a 57 y.o. female coming in with complaint of back pain. She has not been having some pain this week for some reason. She also feels like she is about to get a migraine.  Patient has some tightness recently.  Moving her mother stuff around secondary to renovation.     Past Medical History:  Diagnosis Date  . Allergy   . Asthma   . Osteoporosis    Past Surgical History:  Procedure Laterality Date  . ABDOMINAL HYSTERECTOMY    . APPENDECTOMY    . KNEE SURGERY     Social History   Socioeconomic History  . Marital status: Divorced    Spouse name: Not on file  . Number of children: Not on file  . Years of education: Not on file  . Highest education level: Not on file  Occupational History  . Not on file  Social Needs  . Financial resource strain: Not on file  . Food insecurity:    Worry: Not on file    Inability: Not on file  . Transportation needs:    Medical: Not on file    Non-medical: Not on file  Tobacco Use  . Smoking status: Never Smoker  . Smokeless tobacco: Never Used  Substance and Sexual Activity  . Alcohol use: Yes    Comment: 2/wk  . Drug use: No  . Sexual activity: Not on file  Lifestyle  . Physical activity:    Days per week: Not on file    Minutes per session: Not on file  . Stress: Not on file  Relationships  . Social connections:    Talks on phone: Not on file    Gets together: Not on file    Attends religious service: Not on file    Active member of club or organization: Not on file    Attends meetings of clubs or organizations: Not on file    Relationship status: Not on file  Other Topics Concern  . Not on file  Social History Narrative   Exercises regularly   Allergies  Allergen Reactions  . Kiwi Extract Hives, Shortness Of Breath and Itching  . Latex   .  Penicillins   . Sulfonamide Derivatives    Family History  Problem Relation Age of Onset  . Heart disease Father 40  . Irregular heart beat Mother   . Stroke Maternal Grandmother   . Heart attack Maternal Grandfather   . Stroke Paternal Grandmother   . Heart attack Paternal Grandfather   . Heart attack Brother 45  . Breast cancer Neg Hx      Past medical history, social, surgical and family history all reviewed in electronic medical record.  No pertanent information unless stated regarding to the chief complaint.   Review of Systems:Review of systems updated and as accurate as of 10/29/17  No headache, visual changes, nausea, vomiting, diarrhea, constipation, dizziness, abdominal pain, skin rash, fevers, chills, night sweats, weight loss, swollen lymph nodes, body aches, joint swelling, muscle aches, chest pain, shortness of breath, mood changes.   Objective  Blood pressure 110/68, pulse 90, height  (1.575 m), weight 159 lb (72.1 kg), SpO2 99 %. Systems examined below as of 10/29/17   General: No apparent distress alert and oriented x3 mood and affect normal, dressed appropriately.  HEENT: Pupils equal,  extraocular movements intact  Respiratory: Patient's speak in full sentences and does not appear short of breath  Cardiovascular: No lower extremity edema, non tender, no erythema  Skin: Warm dry intact with no signs of infection or rash on extremities or on axial skeleton.  Abdomen: Soft nontender  Neuro: Cranial nerves II through XII are intact, neurovascularly intact in all extremities with 2+ DTRs and 2+ pulses.  Lymph: No lymphadenopathy of posterior or anterior cervical chain or axillae bilaterally.  Gait normal with good balance and coordination.  MSK:  tender with full range of motion and good stability and symmetric strength and tone of shoulders, elbows, wrist, hip, knee and ankles bilaterally.  Neck: Inspection mild loss of lordosis. No palpable stepoffs. Negative  Spurling's maneuver. Full neck range of motion Grip strength and sensation normal in bilateral hands Strength good C4 to T1 distribution No sensory change to C4 to T1 Negative Hoffman sign bilaterally Reflexes normal Tightness of the trapezius bilaterally  Back exam shows significant increase in tightness of the paraspinal musculature of the lumbar spine.  Patient does have some loss of lordosis.  Patient has a positive Pearlean Brownie test on the right.  Negative straight leg test.  Tenderness over the right sacroiliac joint.  Full strength of the lower extremities.   Impression and Recommendations:     This case required medical decision making of moderate complexity.      Note: This dictation was prepared with Dragon dictation along with smaller phrase technology. Any transcriptional errors that result from this process are unintentional.

## 2017-10-29 ENCOUNTER — Encounter: Payer: Self-pay | Admitting: Family Medicine

## 2017-10-29 ENCOUNTER — Ambulatory Visit: Payer: 59 | Admitting: Family Medicine

## 2017-10-29 VITALS — BP 110/68 | HR 90 | Ht 62.0 in | Wt 159.0 lb

## 2017-10-29 DIAGNOSIS — M999 Biomechanical lesion, unspecified: Secondary | ICD-10-CM | POA: Diagnosis not present

## 2017-10-29 DIAGNOSIS — M542 Cervicalgia: Secondary | ICD-10-CM

## 2017-10-29 NOTE — Patient Instructions (Signed)
Good to see you  Overall you are doing great  Ice is your friend.  Stay active Watch the back a little and if worsening call  See me again in 3 weeks or if doing great then move it to 6 weeks

## 2017-10-29 NOTE — Assessment & Plan Note (Signed)
Decision today to treat with OMT was based on Physical Exam  After verbal consent patient was treated with HVLA, ME, FPR techniques in cervical, thoracic, lumbar and sacral areas  Patient tolerated the procedure well with improvement in symptoms  Patient given exercises, stretches and lifestyle modifications  See medications in patient instructions if given  Patient will follow up in 3 weeks 

## 2017-10-29 NOTE — Assessment & Plan Note (Signed)
Stable.  I do think that there is some psychosomatic as well as underlying muscle imbalances that likely contributed to most of the discomfort and pain.  Patient though has had some joint swelling previously but laboratory work-up had been fairly unremarkable.  Patient has been doing fairly well with conservative therapy overall as well as low with osteopathic manipulation.  Same regimen at this point and follow-up in 3 weeks

## 2017-10-31 DIAGNOSIS — H524 Presbyopia: Secondary | ICD-10-CM | POA: Diagnosis not present

## 2017-11-12 DIAGNOSIS — H02821 Cysts of right upper eyelid: Secondary | ICD-10-CM | POA: Diagnosis not present

## 2017-11-20 MED FILL — predniSONE 20 MG TABS: 20 | 6 days supply | Qty: 18 | Fill #0

## 2017-11-25 NOTE — Progress Notes (Signed)
[facility] Smith D.O[facility] [address] [place], [place] [address] Phone: [phone] Subjective:     CC: Back pain  YQM:VHQIONGEXBHPI:Subjective  Melanie [name] is a 57 y.o. female coming in with complaint of back pain. Earlier this week she was working out with her trainer and performed some squats with dumbbells at her side. She felt sharp pain in the lumbar spine. Has been in constant pain since then. Denies any radiating pain.  Patient states that it is severely tender.  Going from a sitting to standing      Past Medical History:  Diagnosis Date  . Allergy   . Asthma   . Osteoporosis    Past Surgical History:  Procedure Laterality Date  . ABDOMINAL HYSTERECTOMY    . APPENDECTOMY    . KNEE SURGERY     Social History   Socioeconomic History  . Marital status: Divorced    Spouse name: Not on file  . Number of children: Not on file  . Years of education: Not on file  . Highest education level: Not on file  Occupational History  . Not on file  Social Needs  . Financial resource strain: Not on file  . Food insecurity:    Worry: Not on file    Inability: Not on file  . Transportation needs:    Medical: Not on file    Non-medical: Not on file  Tobacco Use  . Smoking status: Never Smoker  . Smokeless tobacco: Never Used  Substance and Sexual Activity  . Alcohol use: Yes    Comment: 2/wk  . Drug use: No  . Sexual activity: Not on file  Lifestyle  . Physical activity:    Days per week: Not on file    Minutes per session: Not on file  . Stress: Not on file  Relationships  . Social connections:    Talks on phone: Not on file    Gets together: Not on file    Attends religious service: Not on file    Active member of club or organization: Not on file    Attends meetings of clubs or organizations: Not on file    Relationship status: Not on file  Other Topics Concern  . Not on file  Social History Narrative   Exercises regularly   Allergies  Allergen  Reactions  . Kiwi Extract Hives, Shortness Of Breath and Itching  . Latex   . Penicillins   . Sulfonamide Derivatives    Family History  Problem Relation Age of Onset  . Heart disease Father 7239  . Irregular heart beat Mother   . Stroke Maternal Grandmother   . Heart attack Maternal Grandfather   . Stroke Paternal Grandmother   . Heart attack Paternal Grandfather   . Heart attack Brother 45  . Breast cancer Neg Hx      Past medical history, social, surgical and family history all reviewed in electronic medical record.  No pertanent information unless stated regarding to the chief complaint.   Review of Systems:Review of systems updated and as accurate as of 11/26/17  No headache, visual changes, nausea, vomiting, diarrhea, constipation, dizziness, abdominal pain, skin rash, fevers, chills, night sweats, weight loss, swollen lymph nodes, body aches, joint swelling, chest pain, shortness of breath, mood changes.  Positive muscle aches  Objective  Blood pressure 124/82, pulse 94, height 5\' 2"  (1.575 m), SpO2 95 %. Systems examined below as of 11/26/17   General: No apparent distress alert and  oriented x3 mood and affect normal, dressed appropriately.  HEENT: Pupils equal, extraocular movements intact  Respiratory: Patient's speak in full sentences and does not appear short of breath  Cardiovascular: No lower extremity edema, non tender, no erythema  Skin: Warm dry intact with no signs of infection or rash on extremities or on axial skeleton.  Abdomen: Soft nontender  Neuro: Cranial nerves II through XII are intact, neurovascularly intact in all extremities with 2+ DTRs and 2+ pulses.  Lymph: No lymphadenopathy of posterior or anterior cervical chain or axillae bilaterally.  Gait normal with good balance and coordination.  MSK:  Non tender with full range of motion and good stability and symmetric strength and tone of shoulders, elbows, wrist, hip, knee and ankles bilaterally.  Back  Exam:  Inspection: Loss of lordosis Motion: Flexion 25 deg, Extension 15 deg worsening pain, Side Bending to 35 deg bilaterally,  Rotation to 5 deg bilaterally  SLR laying: Negative  XSLR laying: Negative  Palpable tenderness: Tender to palpation the paraspinal musculature.  Mostly at the L5-S1 bilaterally FABER: Positive Pearlean Brownie. Sensory change: Gross sensation intact to all lumbar and sacral dermatomes.  Reflexes: 2+ at both patellar tendons, 2+ at achilles tendons, Babinski's downgoing.  Strength at foot  Plantar-flexion: 5/5 Dorsi-flexion: 5/5 Eversion: 5/5 Inversion: 5/5  Leg strength  Quad: 5/5 Hamstring: 5/5 Hip flexor: 5/5 Hip abductors: 5/5  Gait unremarkable.   Osteopathic findings  C6 flexed rotated and side bent left T3 extended rotated and side bent right inhaled third rib T9 extended rotated and side bent left L2 flexed rotated and side bent right Sacrum right on right    Impression and Recommendations:     This case required medical decision making of moderate complexity.      Note: This dictation was prepared with Dragon dictation along with smaller phrase technology. Any transcriptional errors that result from this process are unintentional.

## 2017-11-26 ENCOUNTER — Ambulatory Visit: Payer: 59 | Admitting: Family Medicine

## 2017-11-26 ENCOUNTER — Encounter: Payer: Self-pay | Admitting: Family Medicine

## 2017-11-26 VITALS — BP 124/82 | HR 94 | Ht 62.0 in

## 2017-11-26 DIAGNOSIS — M5442 Lumbago with sciatica, left side: Secondary | ICD-10-CM | POA: Diagnosis not present

## 2017-11-26 DIAGNOSIS — M999 Biomechanical lesion, unspecified: Secondary | ICD-10-CM | POA: Diagnosis not present

## 2017-11-26 MED ORDER — METHYLPREDNISOLONE ACETATE 80 MG/ML IJ SUSP
80.0000 mg | Freq: Once | INTRAMUSCULAR | Status: AC
  Administered 2017-11-26: 80 mg via INTRAMUSCULAR

## 2017-11-26 MED ORDER — TIZANIDINE HCL 4 MG PO TABS: 4.0000 mg | ORAL_TABLET | Freq: Every evening | ORAL | 2 refills | Status: AC

## 2017-11-26 MED ORDER — KETOROLAC TROMETHAMINE 60 MG/2ML IM SOLN
60.0000 mg | Freq: Once | INTRAMUSCULAR | Status: AC
  Administered 2017-11-26: 60 mg via INTRAMUSCULAR

## 2017-11-26 MED FILL — tiZANidine HCL 4 MG TABS: 4 | 30 days supply | Qty: 30 | Fill #0

## 2017-11-26 NOTE — Patient Instructions (Addendum)
Good to see you  Melanie [name] is your friend.  Stay active but no lifting for a week.  Zanaflex at night will help as a muscle relaxer 2 injections today  See me again in 4 weeks Call if you need me sooner

## 2017-11-27 NOTE — Assessment & Plan Note (Signed)
Decision today to treat with OMT was based on Physical Exam  After verbal consent patient was treated with HVLA, ME, FPR techniques in cervical, thoracic, lumbar and sacral areas  Patient tolerated the procedure well with improvement in symptoms  Patient given exercises, stretches and lifestyle modifications  See medications in patient instructions if given  Patient will follow up in 2-3 weeks 

## 2017-11-27 NOTE — Assessment & Plan Note (Signed)
Exacerbation.  Discussed topical anti-inflammatories, muscle relaxer given.  2 injections given as well will help with some of patient's discomfort and pain.  Following up again in 2 to 3 weeks.  Worsening symptoms to call and we will try to see patient sooner.

## 2017-12-23 NOTE — Progress Notes (Signed)
[facility] Carlissa Pesola D.O. [facility] [address] [place], [place] [address] Phone: [phone] Subjective:     CC: Back and neck pain  BJY:NWGNFAOZHYHPI:Subjective  Melanie [name] Dupredams is a 57 y.o. female coming in with complaint of back pain. States that today her back is better than the last visit. Patient has continued to be active.  Working in the pool and is working out on a regular basis.  Some mild discomfort with squatting but otherwise fairly unremarkable.     Past Medical History:  Diagnosis Date  . Allergy   . Asthma   . Osteoporosis    Past Surgical History:  Procedure Laterality Date  . ABDOMINAL HYSTERECTOMY    . APPENDECTOMY    . KNEE SURGERY     Social History   Socioeconomic History  . Marital status: Divorced    Spouse name: Not on file  . Number of children: Not on file  . Years of education: Not on file  . Highest education level: Not on file  Occupational History  . Not on file  Social Needs  . Financial resource strain: Not on file  . Food insecurity:    Worry: Not on file    Inability: Not on file  . Transportation needs:    Medical: Not on file    Non-medical: Not on file  Tobacco Use  . Smoking status: Never Smoker  . Smokeless tobacco: Never Used  Substance and Sexual Activity  . Alcohol use: Yes    Comment: 2/wk  . Drug use: No  . Sexual activity: Not on file  Lifestyle  . Physical activity:    Days per week: Not on file    Minutes per session: Not on file  . Stress: Not on file  Relationships  . Social connections:    Talks on phone: Not on file    Gets together: Not on file    Attends religious service: Not on file    Active member of club or organization: Not on file    Attends meetings of clubs or organizations: Not on file    Relationship status: Not on file  Other Topics Concern  . Not on file  Social History Narrative   Exercises regularly   Allergies  Allergen Reactions  . Kiwi Extract Hives, Shortness Of Breath and  Itching  . Latex   . Penicillins   . Sulfonamide Derivatives    Family History  Problem Relation Age of Onset  . Heart disease Father 8439  . Irregular heart beat Mother   . Stroke Maternal Grandmother   . Heart attack Maternal Grandfather   . Stroke Paternal Grandmother   . Heart attack Paternal Grandfather   . Heart attack Brother 45  . Breast cancer Neg Hx      Past medical history, social, surgical and family history all reviewed in electronic medical record.  No pertanent information unless stated regarding to the chief complaint.   Review of Systems:Review of systems updated and as accurate as of 12/23/17  No headache, visual changes, nausea, vomiting, diarrhea, constipation, dizziness, abdominal pain, skin rash, fevers, chills, night sweats, weight loss, swollen lymph nodes, body aches, joint swelling, muscle aches, chest pain, shortness of breath, mood changes.   Objective  There were no vitals taken for this visit. Systems examined below as of 12/23/17   General: No apparent distress alert and oriented x3 mood and affect normal, dressed appropriately.  HEENT: Pupils equal, extraocular movements intact  Respiratory: Patient's  speak in full sentences and does not appear short of breath  Cardiovascular: No lower extremity edema, non tender, no erythema  Skin: Warm dry intact with no signs of infection or rash on extremities or on axial skeleton.  Abdomen: Soft nontender  Neuro: Cranial nerves II through XII are intact, neurovascularly intact in all extremities with 2+ DTRs and 2+ pulses.  Lymph: No lymphadenopathy of posterior or anterior cervical chain or axillae bilaterally.  Gait normal with good balance and coordination.  MSK:  Non tender with full range of motion and good stability and symmetric strength and tone of shoulders, elbows, wrist, hip, knee and ankles bilaterally.   Neck: Inspection loss of lordosis. No palpable stepoffs. Negative Spurling's  maneuver. Patient does have some limited range of motion in sidebending bilaterally Grip strength and sensation normal in bilateral hands Strength good C4 to T1 distribution No sensory change to C4 to T1 Negative Hoffman sign bilaterally Reflexes normal Mild tightness noted in the neck paraspinal musculature bilaterally  Osteopathic findings Cervical C2 flexed rotated and side bent right C4 flexed rotated and side bent left C6 flexed rotated and side bent left T3 extended rotated and side bent right inhaled third rib T9 extended rotated and side bent left L1 flexed rotated and side bent left  Sacrum left on left   Impression and Recommendations:     This case required medical decision making of moderate complexity.      Note: This dictation was prepared with Dragon dictation along with smaller phrase technology. Any transcriptional errors that result from this process are unintentional.

## 2017-12-24 ENCOUNTER — Encounter: Payer: Self-pay | Admitting: Family Medicine

## 2017-12-24 ENCOUNTER — Ambulatory Visit: Payer: 59 | Admitting: Family Medicine

## 2017-12-24 VITALS — BP 100/78 | HR 88 | Ht 62.0 in | Wt 160.0 lb

## 2017-12-24 DIAGNOSIS — M999 Biomechanical lesion, unspecified: Secondary | ICD-10-CM | POA: Diagnosis not present

## 2017-12-24 DIAGNOSIS — M542 Cervicalgia: Secondary | ICD-10-CM | POA: Diagnosis not present

## 2017-12-24 NOTE — Patient Instructions (Signed)
Good to see you  You are doing great  Ice is your friend.  See me again in 4-6 weeks

## 2017-12-24 NOTE — Assessment & Plan Note (Signed)
Decision today to treat with OMT was based on Physical Exam  After verbal consent patient was treated with HVLA, ME, FPR techniques in cervical, thoracic, rib lumbar and sacral areas  Patient tolerated the procedure well with improvement in symptoms  Patient given exercises, stretches and lifestyle modifications  See medications in patient instructions if given  Patient will follow up in 4-6 weeks 

## 2017-12-24 NOTE — Assessment & Plan Note (Signed)
Tightness overall.  Responding well to manipulation.  Discussed icing regimen and home exercise.  Discussed which activities to do which was to avoid.  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  Follow-up again in 4 to 8 weeks

## 2018-01-25 NOTE — Progress Notes (Signed)
[facility] Smith D.O. [facility] [address] [place], [place] [address] Phone: [phone] Subjective:     CC: Back and neck pain follow-up  BJY:NWGNFAOZHYHPI:Subjective  Melanie [name] is a 57 y.o. female coming in with complaint of back and neck pain. She continues to have lower back pain that is worse today with a storm that is impending. She said that her right hip feels tight today.  Patient since then some mild discomfort that seems to be intermittent.  Nothing that seems to be severe though.  Patient states that when her allergies seem to be acting up she has more discomfort and pain as well      Past Medical History:  Diagnosis Date  . Allergy   . Asthma   . Osteoporosis    Past Surgical History:  Procedure Laterality Date  . ABDOMINAL HYSTERECTOMY    . APPENDECTOMY    . KNEE SURGERY     Social History   Socioeconomic History  . Marital status: Divorced    Spouse name: Not on file  . Number of children: Not on file  . Years of education: Not on file  . Highest education level: Not on file  Occupational History  . Not on file  Social Needs  . Financial resource strain: Not on file  . Food insecurity:    Worry: Not on file    Inability: Not on file  . Transportation needs:    Medical: Not on file    Non-medical: Not on file  Tobacco Use  . Smoking status: Never Smoker  . Smokeless tobacco: Never Used  Substance and Sexual Activity  . Alcohol use: Yes    Comment: 2/wk  . Drug use: No  . Sexual activity: Not on file  Lifestyle  . Physical activity:    Days per week: Not on file    Minutes per session: Not on file  . Stress: Not on file  Relationships  . Social connections:    Talks on phone: Not on file    Gets together: Not on file    Attends religious service: Not on file    Active member of club or organization: Not on file    Attends meetings of clubs or organizations: Not on file    Relationship status: Not on file  Other Topics Concern  . Not  on file  Social History Narrative   Exercises regularly   Allergies  Allergen Reactions  . Kiwi Extract Hives, Shortness Of Breath and Itching  . Latex   . Penicillins   . Sulfonamide Derivatives    Family History  Problem Relation Age of Onset  . Heart disease Father 1139  . Irregular heart beat Mother   . Stroke Maternal Grandmother   . Heart attack Maternal Grandfather   . Stroke Paternal Grandmother   . Heart attack Paternal Grandfather   . Heart attack Brother 45  . Breast cancer Neg Hx      Past medical history, social, surgical and family history all reviewed in electronic medical record.  No pertanent information unless stated regarding to the chief complaint.   Review of Systems:Review of systems updated and as accurate as of 01/28/18  No headache, visual changes, nausea, vomiting, diarrhea, constipation, dizziness, abdominal pain, skin rash, fevers, chills, night sweats, weight loss, swollen lymph nodes, body aches, joint swelling,  chest pain, shortness of breath, mood changes.  Positive muscle aches  Objective  Blood pressure 120/80, pulse 86, height 5\' 2"  (  1.575 m), weight 161 lb (73 kg), SpO2 98 %. Systems examined below as of 01/28/18   General: No apparent distress alert and oriented x3 mood and affect normal, dressed appropriately.  HEENT: Pupils equal, extraocular movements intact  Respiratory: Patient's speak in full sentences and does not appear short of breath  Cardiovascular: No lower extremity edema, non tender, no erythema  Skin: Warm dry intact with no signs of infection or rash on extremities or on axial skeleton.  Abdomen: Soft nontender  Neuro: Cranial nerves II through XII are intact, neurovascularly intact in all extremities with 2+ DTRs and 2+ pulses.  Lymph: No lymphadenopathy of posterior or anterior cervical chain or axillae bilaterally.  Gait normal with good balance and coordination.  MSK:  Non tender with full range of motion and good  stability and symmetric strength and tone of shoulders, elbows, wrist, hip, knee and ankles bilaterally.  Back Exam:  Inspection: Mild loss of lordosis Motion: Flexion 45 deg, Extension 25 deg, Side Bending to 35 deg bilaterally,  Rotation to 35 deg bilaterally  SLR laying: Negative  XSLR laying: Negative  Palpable tenderness: Tenderness in the paraspinal musculature lumbar spine right side more over the piriformis and the greater trochanteric area. FABER: Mild positive right. Sensory change: Gross sensation intact to all lumbar and sacral dermatomes.  Reflexes: 2+ at both patellar tendons, 2+ at achilles tendons, Babinski's downgoing.  Strength at foot  Plantar-flexion: 5/5 Dorsi-flexion: 5/5 Eversion: 5/5 Inversion: 5/5  Leg strength  Quad: 5/5 Hamstring: 5/5 Hip flexor: 5/5 Hip abductors: 4/5  Gait unremarkable.   Osteopathic findings C2 flexed rotated and side bent right C7  flexed rotated and side bent right  T3 extended rotated and side bent right inhaled third rib T6 extended rotated and side bent left L2 flexed rotated and side bent right Sacrum right on right     Impression and Recommendations:     This case required medical decision making of moderate complexity.      Note: This dictation was prepared with Dragon dictation along with smaller phrase technology. Any transcriptional errors that result from this process are unintentional.

## 2018-01-28 ENCOUNTER — Encounter: Payer: Self-pay | Admitting: Family Medicine

## 2018-01-28 ENCOUNTER — Ambulatory Visit: Payer: 59 | Admitting: Family Medicine

## 2018-01-28 VITALS — BP 120/80 | HR 86 | Ht 62.0 in | Wt 161.0 lb

## 2018-01-28 DIAGNOSIS — M7061 Trochanteric bursitis, right hip: Secondary | ICD-10-CM | POA: Diagnosis not present

## 2018-01-28 DIAGNOSIS — M999 Biomechanical lesion, unspecified: Secondary | ICD-10-CM | POA: Diagnosis not present

## 2018-01-28 NOTE — Assessment & Plan Note (Signed)
Still very uncomfortable.  Patient complains about 8 months out from previous injection.  Discussed with patient about icing regimen, home exercise, which activities to do which wants to avoid.  Patient is to increase activity as tolerated.  I believe the patient is doing relatively well.  Follow-up with me again in 4 to 8 weeks

## 2018-01-28 NOTE — Assessment & Plan Note (Signed)
Decision today to treat with OMT was based on Physical Exam  After verbal consent patient was treated with HVLA, ME, FPR techniques in cervical, thoracic, rib, lumbar and sacral areas  Patient tolerated the procedure well with improvement in symptoms  Patient given exercises, stretches and lifestyle modifications  See medications in patient instructions if given  Patient will follow up in 7 weeks

## 2018-01-28 NOTE — Patient Instructions (Addendum)
Good to see you  Ice is your friend I hope you feel better Keep it up  Hip abductors are key as well  See me again in 7 weeks

## 2018-02-02 DIAGNOSIS — H1045 Other chronic allergic conjunctivitis: Secondary | ICD-10-CM | POA: Diagnosis not present

## 2018-03-16 NOTE — Progress Notes (Signed)
[name] [address] [place], [place] [address] Phone: [phone] Subjective:   Melanie [name], am serving as a scribe for Dr. Antoine Primas.   CC: Back and hip pain  BJY:[id number]  Melanie [name] is a 57 y.o. female coming in with complaint of back and hip pain. Has been doing well. Has been doing 4-5 days of pool aerobics, running, swimming.  Patient has been doing well.  Some mild increased discomfort in the left lateral aspect of the hip but nothing severe.       Past Medical History:  Diagnosis Date  . Allergy   . Asthma   . Osteoporosis    Past Surgical History:  Procedure Laterality Date  . ABDOMINAL HYSTERECTOMY    . APPENDECTOMY    . KNEE SURGERY     Social History   Socioeconomic History  . Marital status: Divorced    Spouse name: Not on file  . Number of children: Not on file  . Years of education: Not on file  . Highest education level: Not on file  Occupational History  . Not on file  Social Needs  . Financial resource strain: Not on file  . Food insecurity:    Worry: Not on file    Inability: Not on file  . Transportation needs:    Medical: Not on file    Non-medical: Not on file  Tobacco Use  . Smoking status: Never Smoker  . Smokeless tobacco: Never Used  Substance and Sexual Activity  . Alcohol use: Yes    Comment: 2/wk  . Drug use: No  . Sexual activity: Not on file  Lifestyle  . Physical activity:    Days per week: Not on file    Minutes per session: Not on file  . Stress: Not on file  Relationships  . Social connections:    Talks on phone: Not on file    Gets together: Not on file    Attends religious service: Not on file    Active member of club or organization: Not on file    Attends meetings of clubs or organizations: Not on file    Relationship status: Not on file  Other Topics Concern  . Not on file  Social History Narrative   Exercises regularly   Allergies  Allergen Reactions    . Kiwi Extract Hives, Shortness Of Breath and Itching  . Latex   . Penicillins   . Sulfonamide Derivatives    Family History  Problem Relation Age of Onset  . Heart disease Father 20  . Irregular heart beat Mother   . Stroke Maternal Grandmother   . Heart attack Maternal Grandfather   . Stroke Paternal Grandmother   . Heart attack Paternal Grandfather   . Heart attack Brother 45  . Breast cancer Neg Hx         Current Outpatient Medications (Respiratory):  .  albuterol (PROAIR HFA) 108 (90 Base) MCG/ACT inhaler, Inhale 1 puff into the lungs every 6 (six) hours as needed for wheezing or shortness of breath. [name]  azelastine (ASTELIN) 0.1 % nasal spray, Place 1 spray into both nostrils 2 (two) times daily. .  beclomethasone (QVAR) 40 MCG/ACT inhaler, Inhale 1 puff into the lungs 2 (two) times daily. Yearly physical due in April must see MD for refills .  fluticasone (FLONASE) 50 MCG/ACT nasal spray, USE 1 SPRAY INTO BOTH NOSTRILS 2 TIMES DAILY. [name]  ipratropium (ATROVENT) 0.03 % nasal spray,  Place 2 sprays into both nostrils every 12 (twelve) hours. [name]  loratadine (CLARITIN) 10 MG tablet, Take 10 mg by mouth daily.  Current Facility-Administered Medications (Respiratory):  .  ipratropium-albuterol (DUONEB) 0.5-2.5 (3) MG/3ML nebulizer solution 3 mL  Current Outpatient Medications (Analgesics):  .  rizatriptan (MAXALT-MLT) 10 MG disintegrating tablet, Take 1 tablet (10 mg total) by mouth as needed for migraine. May repeat in 2 hours if needed     Current Outpatient Medications (Other):  .  cholecalciferol (VITAMIN D) 1000 units tablet, Take 2,000 Units by mouth daily. .  Diclofenac Sodium (PENNSAID) 2 % SOLN, Place 1 application onto the skin 2 (two) times daily. .  Flaxseed, Linseed, (FLAX SEED OIL) 1000 MG CAPS, Take 1,000 mg by mouth. .  Multiple Vitamin (MULTIVITAMIN) tablet, Take 1 tablet by mouth daily. .  Probiotic Product (PROBIOTIC DAILY PO), Take by mouth.     Past  medical history, social, surgical and family history all reviewed in electronic medical record.  No pertanent information unless stated regarding to the chief complaint.   Review of Systems:  No headache, visual changes, nausea, vomiting, diarrhea, constipation, dizziness, abdominal pain, skin rash, fevers, chills, night sweats, weight loss, swollen lymph nodes, body aches, joint swelling,  chest pain, shortness of breath, mood changes.  Positive muscle aches  Objective  Blood pressure 124/88, pulse 75, height 5\' 2"  (1.575 m), weight 161 lb (73 kg), SpO2 98 %.   General: No apparent distress alert and oriented x3 mood and affect normal, dressed appropriately.  HEENT: Pupils equal, extraocular movements intact  Respiratory: Patient's speak in full sentences and does not appear short of breath  Cardiovascular: No lower extremity edema, non tender, no erythema  Skin: Warm dry intact with no signs of infection or rash on extremities or on axial skeleton.  Abdomen: Soft nontender  Neuro: Cranial nerves II through XII are intact, neurovascularly intact in all extremities with 2+ DTRs and 2+ pulses.  Lymph: No lymphadenopathy of posterior or anterior cervical chain or axillae bilaterally.  Gait normal with good balance and coordination.  MSK:  Non tender with full range of motion and good stability and symmetric strength and tone of shoulders, elbows, wrist, hip, knee and ankles bilaterally.  Back Exam:  Inspection: Unremarkable  Motion: Flexion 45 deg, Extension 20 deg, Side Bending to 35 deg bilaterally,  Rotation to 45 deg bilaterally  SLR laying: Negative  XSLR laying: Negative  Palpable tenderness: Tender to palpation the paraspinal musculature lumbar spine left greater than right. FABER: Positive Faber bilaterally. Sensory change: Gross sensation intact to all lumbar and sacral dermatomes.  Reflexes: 2+ at both patellar tendons, 2+ at achilles tendons, Babinski's downgoing.  Strength at  foot  Plantar-flexion: 5/5 Dorsi-flexion: 5/5 Eversion: 5/5 Inversion: 5/5  Leg strength  Quad: 5/5 Hamstring: 5/5 Hip flexor: 5/5 Hip abductors: 4/5 but symmetric Gait unremarkable.  Osteopathic findings C2 flexed rotated and side bent right C4 flexed rotated and side bent left C7  flexed rotated and side bent left T3 extended rotated and side bent right inhaled third rib T9 extended rotated and side bent left 32 flexed rotated and side bent right Sacrum left on left     Impression and Recommendations:     This case required medical decision making of moderate complexity. The above documentation has been reviewed and is accurate and complete Judi Saa, DO       Note: This dictation was prepared with Dragon dictation along with smaller phrase technology. Any  transcriptional errors that result from this process are unintentional.

## 2018-03-18 ENCOUNTER — Encounter: Payer: Self-pay | Admitting: Family Medicine

## 2018-03-18 ENCOUNTER — Ambulatory Visit: Payer: 59 | Admitting: Family Medicine

## 2018-03-18 VITALS — BP 124/88 | HR 75 | Ht 62.0 in | Wt 161.0 lb

## 2018-03-18 DIAGNOSIS — M9903 Segmental and somatic dysfunction of lumbar region: Secondary | ICD-10-CM

## 2018-03-18 DIAGNOSIS — M9902 Segmental and somatic dysfunction of thoracic region: Secondary | ICD-10-CM

## 2018-03-18 DIAGNOSIS — M9904 Segmental and somatic dysfunction of sacral region: Secondary | ICD-10-CM | POA: Diagnosis not present

## 2018-03-18 DIAGNOSIS — M9908 Segmental and somatic dysfunction of rib cage: Secondary | ICD-10-CM

## 2018-03-18 DIAGNOSIS — M9901 Segmental and somatic dysfunction of cervical region: Secondary | ICD-10-CM

## 2018-03-18 DIAGNOSIS — M999 Biomechanical lesion, unspecified: Secondary | ICD-10-CM

## 2018-03-18 DIAGNOSIS — G5702 Lesion of sciatic nerve, left lower limb: Secondary | ICD-10-CM

## 2018-03-18 NOTE — Assessment & Plan Note (Signed)
Decision today to treat with OMT was based on Physical Exam  After verbal consent patient was treated with HVLA, ME, FPR techniques in cervical, thoracic, rib, lumbar and sacral areas  Patient tolerated the procedure well with improvement in symptoms  Patient given exercises, stretches and lifestyle modifications  See medications in patient instructions if given  Patient will follow up in 4-6 weeks 

## 2018-03-18 NOTE — Assessment & Plan Note (Signed)
Mild increase in worsening.  We discussed the importance again a hip abductor strengthening, ergonomics, which activities to do which wants to avoid.  Patient is to increase activity slowly over the course the next several weeks.  Follow-up again in 4 to 8 weeks

## 2018-03-18 NOTE — Patient Instructions (Signed)
God to see you  More of straight leg in the pol with the one exercise Look at [facility] or Randel Pigg or [employer] if you need a different water shoe See me again in 6 weeks

## 2018-04-29 ENCOUNTER — Ambulatory Visit: Payer: 59 | Admitting: Family Medicine

## 2018-04-29 ENCOUNTER — Encounter: Payer: Self-pay | Admitting: Family Medicine

## 2018-04-29 VITALS — BP 118/76 | HR 76 | Ht 62.0 in | Wt 159.0 lb

## 2018-04-29 DIAGNOSIS — M9904 Segmental and somatic dysfunction of sacral region: Secondary | ICD-10-CM

## 2018-04-29 DIAGNOSIS — M9908 Segmental and somatic dysfunction of rib cage: Secondary | ICD-10-CM

## 2018-04-29 DIAGNOSIS — M9903 Segmental and somatic dysfunction of lumbar region: Secondary | ICD-10-CM

## 2018-04-29 DIAGNOSIS — M9902 Segmental and somatic dysfunction of thoracic region: Secondary | ICD-10-CM

## 2018-04-29 DIAGNOSIS — M9901 Segmental and somatic dysfunction of cervical region: Secondary | ICD-10-CM

## 2018-04-29 DIAGNOSIS — M999 Biomechanical lesion, unspecified: Secondary | ICD-10-CM | POA: Diagnosis not present

## 2018-04-29 DIAGNOSIS — M542 Cervicalgia: Secondary | ICD-10-CM

## 2018-04-29 NOTE — Assessment & Plan Note (Signed)
Decision today to treat with OMT was based on Physical Exam  After verbal consent patient was treated with HVLA, ME, FPR techniques in cervical, thoracic, rib lumbar and sacral areas  Patient tolerated the procedure well with improvement in symptoms  Patient given exercises, stretches and lifestyle modifications  See medications in patient instructions if given  Patient will follow up in 4-8 weeks 

## 2018-04-29 NOTE — Assessment & Plan Note (Signed)
Stable.  Patient has been improving with the activities she has been doing.  Patient has been noticing less discomfort and pain.  Has just changed jobs as well.  Discussed icing regimen and home exercises on posture and ergonomics again.  Responding well to manipulation and follow-up again in 6 to 8 weeks

## 2018-04-29 NOTE — Patient Instructions (Signed)
Good to see you  Ice is your friend  I am impressed  Stay active in the pool! See me again in 6 weeks!

## 2018-04-29 NOTE — Progress Notes (Signed)
Melanie ScaleZach Antonieta [name] D.O. [facility] [address] [place], [place] [address] Phone: [phone] Subjective:     CC: Back pain follow-up  LKG:[id number]:Subjective  Melanie [name] is a 57 y.o. female coming in with complaint of back pain. She said that she is tight due to the weather. Other wise no increase in pain. Has been swimming and states that her hip pain seems to be less than usual. Is here for OMT to manage her lower back pain.       Past Medical History:  Diagnosis Date  . Allergy   . Asthma   . Osteoporosis    Past Surgical History:  Procedure Laterality Date  . ABDOMINAL HYSTERECTOMY    . APPENDECTOMY    . KNEE SURGERY     Social History   Socioeconomic History  . Marital status: Divorced    Spouse name: Not on file  . Number of children: Not on file  . Years of education: Not on file  . Highest education level: Not on file  Occupational History  . Not on file  Social Needs  . Financial resource strain: Not on file  . Food insecurity:    Worry: Not on file    Inability: Not on file  . Transportation needs:    Medical: Not on file    Non-medical: Not on file  Tobacco Use  . Smoking status: Never Smoker  . Smokeless tobacco: Never Used  Substance and Sexual Activity  . Alcohol use: Yes    Comment: 2/wk  . Drug use: No  . Sexual activity: Not on file  Lifestyle  . Physical activity:    Days per week: Not on file    Minutes per session: Not on file  . Stress: Not on file  Relationships  . Social connections:    Talks on phone: Not on file    Gets together: Not on file    Attends religious service: Not on file    Active member of club or organization: Not on file    Attends meetings of clubs or organizations: Not on file    Relationship status: Not on file  Other Topics Concern  . Not on file  Social History Narrative   Exercises regularly   Allergies  Allergen Reactions  . Kiwi Extract Hives, Shortness Of Breath and Itching  . Latex   .  Penicillins   . Sulfonamide Derivatives    Family History  Problem Relation Age of Onset  . Heart disease Father 7539  . Irregular heart beat Mother   . Stroke Maternal Grandmother   . Heart attack Maternal Grandfather   . Stroke Paternal Grandmother   . Heart attack Paternal Grandfather   . Heart attack Brother 45  . Breast cancer Neg Hx         Current Outpatient Medications (Respiratory):  .  albuterol (PROAIR HFA) 108 (90 Base) MCG/ACT inhaler, Inhale 1 puff into the lungs every 6 (six) hours as needed for wheezing or shortness of breath. [name].  azelastine (ASTELIN) 0.1 % nasal spray, Place 1 spray into both nostrils 2 (two) times daily. .  beclomethasone (QVAR) 40 MCG/ACT inhaler, Inhale 1 puff into the lungs 2 (two) times daily. Yearly physical due in April must see MD for refills .  fluticasone (FLONASE) 50 MCG/ACT nasal spray, USE 1 SPRAY INTO BOTH NOSTRILS 2 TIMES DAILY. [name].  ipratropium (ATROVENT) 0.03 % nasal spray, Place 2 sprays into both nostrils every 12 (twelve) hours. .[name]  loratadine (CLARITIN) 10 MG tablet, Take 10 mg by mouth daily.  Current Facility-Administered Medications (Respiratory):  .  ipratropium-albuterol (DUONEB) 0.5-2.5 (3) MG/3ML nebulizer solution 3 mL  Current Outpatient Medications (Analgesics):  .  rizatriptan (MAXALT-MLT) 10 MG disintegrating tablet, Take 1 tablet (10 mg total) by mouth as needed for migraine. May repeat in 2 hours if needed     Current Outpatient Medications (Other):  .  cholecalciferol (VITAMIN D) 1000 units tablet, Take 2,000 Units by mouth daily. .  Diclofenac Sodium (PENNSAID) 2 % SOLN, Place 1 application onto the skin 2 (two) times daily. .  Flaxseed, Linseed, (FLAX SEED OIL) 1000 MG CAPS, Take 1,000 mg by mouth. .  Multiple Vitamin (MULTIVITAMIN) tablet, Take 1 tablet by mouth daily. .  Probiotic Product (PROBIOTIC DAILY PO), Take by mouth.     Past medical history, social, surgical and family history all reviewed in  electronic medical record.  No pertanent information unless stated regarding to the chief complaint.   Review of Systems:  No headache, visual changes, nausea, vomiting, diarrhea, constipation, dizziness, abdominal pain, skin rash, fevers, chills, night sweats, weight loss, swollen lymph nodes, body aches, joint swelling, muscle aches, chest pain, shortness of breath, mood changes.  Mild positive muscle aches  Objective  Blood pressure 118/76, pulse 76, height 5\' 2"  (1.575 m), weight 159 lb (72.1 kg), SpO2 98 %.   General: No apparent distress alert and oriented x3 mood and affect normal, dressed appropriately.  HEENT: Pupils equal, extraocular movements intact  Respiratory: Patient's speak in full sentences and does not appear short of breath  Cardiovascular: No lower extremity edema, non tender, no erythema  Skin: Warm dry intact with no signs of infection or rash on extremities or on axial skeleton.  Abdomen: Soft nontender  Neuro: Cranial nerves II through XII are intact, neurovascularly intact in all extremities with 2+ DTRs and 2+ pulses.  Lymph: No lymphadenopathy of posterior or anterior cervical chain or axillae bilaterally.  Gait normal with good balance and coordination.  MSK:  Non tender with full range of motion and good stability and symmetric strength and tone of shoulders, elbows, wrist, hip, knee and ankles bilaterally.  Back Exam:  Inspection: Loss of lordosis Motion: Flexion 45 deg, Extension 25 deg, Side Bending to 45 deg bilaterally,  Rotation to 45 deg bilaterally  SLR laying: Negative  XSLR laying: Negative  Palpable tenderness: Tender to palpation the paraspinal musculature lumbar spine right greater than left.  Tightness in the periscapular region as well. FABER: negative. Sensory change: Gross sensation intact to all lumbar and sacral dermatomes.  Reflexes: 2+ at both patellar tendons, 2+ at achilles tendons, Babinski's downgoing.  Strength at foot    Plantar-flexion: 5/5 Dorsi-flexion: 5/5 Eversion: 5/5 Inversion: 5/5  Leg strength  Quad: 5/5 Hamstring: 5/5 Hip flexor: 5/5 Hip abductors: 5/5  Gait unremarkable.  Neck exam does have some loss of lordosis.  Some tenderness in the paraspinal musculature noted though.  More tightness of the left trapezius.  Negative Spurling's.  Full strength of the upper extremities.   Osteopathic findings  C2 flexed rotated and side bent right T3 extended rotated and side bent right inhaled third rib T6 extended rotated and side bent left L2 flexed rotated and side bent right Sacrum left on left     Impression and Recommendations:     This case required medical decision making of moderate complexity. The above documentation has been reviewed and is accurate and complete Judi Saa, DO  Note: This dictation was prepared with Dragon dictation along with smaller phrase technology. Any transcriptional errors that result from this process are unintentional.

## 2018-05-05 ENCOUNTER — Telehealth: Payer: 59 | Admitting: Family

## 2018-05-05 DIAGNOSIS — B9789 Other viral agents as the cause of diseases classified elsewhere: Secondary | ICD-10-CM

## 2018-05-05 DIAGNOSIS — J069 Acute upper respiratory infection, unspecified: Secondary | ICD-10-CM | POA: Diagnosis not present

## 2018-05-05 MED ORDER — FLUTICASONE PROPIONATE 50 MCG/ACT NA SUSP: 2.0000 | Freq: Every day | NASAL | 6 refills | Status: AC

## 2018-05-05 MED ORDER — BENZONATATE 100 MG PO CAPS: 100.0000 mg | ORAL_CAPSULE | Freq: Three times a day (TID) | ORAL | 0 refills | Status: DC | PRN

## 2018-05-05 NOTE — Progress Notes (Signed)
We are sorry you are not feeling well.  Here is how we plan to help!  Based on what you have shared with me, it looks like you may have a viral upper respiratory infection or a "common cold".  Colds are caused by a large number of viruses; however, rhinovirus is the most common cause.   Symptoms of the common cold vary from person to person, with common symptoms including sore throat, cough, and malaise.  A low-grade fever of 100.4 may present, but is often uncommon.  Symptoms vary however, and are closely related to a person's age or underlying illnesses.  The most common symptoms associated with the common cold are nasal discharge or congestion, cough, sneezing, headache and pressure in the ears and face.  Cold symptoms usually persist for about 3 to 10 days, but can last up to 2 weeks.  It is important to know that colds do not cause serious illness or complications in most cases.    The common cold is transmitted from person to person, with the most common method of transmission being a person's hands.  The virus is able to live on the skin and can infect other persons for up to 2 hours after direct contact.  Also, colds are transmitted when someone coughs or sneezes; thus, it is important to cover the mouth to reduce this risk.  To keep the spread of the common cold at bay, good hand hygiene is very important.  This is an infection that is most likely caused by a virus. There are no specific treatments for the common cold other than to help you with the symptoms until the infection runs its course.    For nasal congestion, you may use an oral decongestants such as Mucinex D or if you have glaucoma or high blood pressure use plain Mucinex.  Saline nasal spray or nasal drops can help and can safely be used as often as needed for congestion.  For your congestion, I have prescribed Fluticasone nasal spray one spray in each nostril twice a day  If you do not have a history of heart disease, hypertension,  diabetes or thyroid disease, prostate/bladder issues or glaucoma, you may also use Sudafed to treat nasal congestion.  It is highly recommended that you consult with a pharmacist or your primary care physician to ensure this medication is safe for you to take.     If you have a cough, you may use cough suppressants such as Delsym and Robitussin.  If you have glaucoma or high blood pressure, you can also use Coricidin HBP.   For cough I have prescribed for you A prescription cough medication called Tessalon Perles 100 mg. You may take 1-2 capsules every 8 hours as needed for cough  If you have a sore or scratchy throat, use a saltwater gargle-  to  teaspoon of salt dissolved in a 4-ounce to 8-ounce glass of warm water.  Gargle the solution for approximately 15-30 seconds and then spit.  It is important not to swallow the solution.  You can also use throat lozenges/cough drops and Chloraseptic spray to help with throat pain or discomfort.  Warm or cold liquids can also be helpful in relieving throat pain.  For headache, pain or general discomfort, you can use Ibuprofen or Tylenol as directed.   Some authorities believe that zinc sprays or the use of Echinacea may shorten the course of your symptoms.   HOME CARE . Only take medications as instructed by your   medical team. . Be sure to drink plenty of fluids. Water is fine as well as fruit juices, sodas and electrolyte beverages. You may want to stay away from caffeine or alcohol. If you are nauseated, try taking small sips of liquids. How do you know if you are getting enough fluid? Your urine should be a pale yellow or almost colorless. . Get rest. . Taking a steamy shower or using a humidifier may help nasal congestion and ease sore throat pain. You can place a towel over your head and breathe in the steam from hot water coming from a faucet. . Using a saline nasal spray works much the same way. . Cough drops, hard candies and sore throat lozenges  may ease your cough. . Avoid close contacts especially the very young and the elderly . Cover your mouth if you cough or sneeze . Always remember to wash your hands.   GET HELP RIGHT AWAY IF: . You develop worsening fever. . If your symptoms do not improve within 10 days . You develop yellow or green discharge from your nose over 3 days. . You have coughing fits . You develop a severe head ache or visual changes. . You develop shortness of breath, difficulty breathing or start having chest pain . Your symptoms persist after you have completed your treatment plan  MAKE SURE YOU   Understand these instructions.  Will watch your condition.  Will get help right away if you are not doing well or get worse.  Your e-visit answers were reviewed by a board certified advanced clinical practitioner to complete your personal care plan. Depending upon the condition, your plan could have included both over the counter or prescription medications. Please review your pharmacy choice. If there is a problem, you may call our nursing hot line at and have the prescription routed to another pharmacy. Your safety is important to us. If you have drug allergies check your prescription carefully.   You can use MyChart to ask questions about today's visit, request a non-urgent call back, or ask for a work or school excuse for 24 hours related to this e-Visit. If it has been greater than 24 hours you will need to follow up with your provider, or enter a new e-Visit to address those concerns. You will get an e-mail in the next two days asking about your experience.  I hope that your e-visit has been valuable and will speed your recovery. Thank you for using e-visits.      

## 2018-06-16 NOTE — Progress Notes (Signed)
[name] [address] [place], [place] [address] Phone: [phone] Subjective:   I, [name], am serving as a scribe for Dr. Antoine Primas.   CC: Back pain follow-up  JOA:[id number]  Melanie [name] is a 58 y.o. female coming in with complaint of back pain. Doing ok but having some hip pain.  Patient has responded to osteopathic manipulation previously.  Has been doing some exercises, mostly aquatic therapy.  No new symptoms.  Just more tightness.      Past Medical History:  Diagnosis Date  . Allergy   . Asthma   . Osteoporosis    Past Surgical History:  Procedure Laterality Date  . ABDOMINAL HYSTERECTOMY    . APPENDECTOMY    . KNEE SURGERY     Social History   Socioeconomic History  . Marital status: Divorced    Spouse name: Not on file  . Number of children: Not on file  . Years of education: Not on file  . Highest education level: Not on file  Occupational History  . Not on file  Social Needs  . Financial resource strain: Not on file  . Food insecurity:    Worry: Not on file    Inability: Not on file  . Transportation needs:    Medical: Not on file    Non-medical: Not on file  Tobacco Use  . Smoking status: Never Smoker  . Smokeless tobacco: Never Used  Substance and Sexual Activity  . Alcohol use: Yes    Comment: 2/wk  . Drug use: No  . Sexual activity: Not on file  Lifestyle  . Physical activity:    Days per week: Not on file    Minutes per session: Not on file  . Stress: Not on file  Relationships  . Social connections:    Talks on phone: Not on file    Gets together: Not on file    Attends religious service: Not on file    Active member of club or organization: Not on file    Attends meetings of clubs or organizations: Not on file    Relationship status: Not on file  Other Topics Concern  . Not on file  Social History Narrative   Exercises regularly   Allergies  Allergen Reactions  . Kiwi Extract  Hives, Shortness Of Breath and Itching  . Latex   . Penicillins   . Sulfonamide Derivatives    Family History  Problem Relation Age of Onset  . Heart disease Father 11  . Irregular heart beat Mother   . Stroke Maternal Grandmother   . Heart attack Maternal Grandfather   . Stroke Paternal Grandmother   . Heart attack Paternal Grandfather   . Heart attack Brother 45  . Breast cancer Neg Hx         Current Outpatient Medications (Respiratory):  .  albuterol (PROAIR HFA) 108 (90 Base) MCG/ACT inhaler, Inhale 1 puff into the lungs every 6 (six) hours as needed for wheezing or shortness of breath. [name]  azelastine (ASTELIN) 0.1 % nasal spray, Place 1 spray into both nostrils 2 (two) times daily. .  beclomethasone (QVAR) 40 MCG/ACT inhaler, Inhale 1 puff into the lungs 2 (two) times daily. Yearly physical due in April must see MD for refills .  benzonatate (TESSALON PERLES) 100 MG capsule, Take 1 capsule (100 mg total) by mouth 3 (three) times daily as needed. .  fluticasone (FLONASE) 50 MCG/ACT nasal spray, Place 2 sprays into  both nostrils daily. [name]  ipratropium (ATROVENT) 0.03 % nasal spray, Place 2 sprays into both nostrils every 12 (twelve) hours. [name]  loratadine (CLARITIN) 10 MG tablet, Take 10 mg by mouth daily.  Current Facility-Administered Medications (Respiratory):  .  ipratropium-albuterol (DUONEB) 0.5-2.5 (3) MG/3ML nebulizer solution 3 mL  Current Outpatient Medications (Analgesics):  .  rizatriptan (MAXALT-MLT) 10 MG disintegrating tablet, Take 1 tablet (10 mg total) by mouth as needed for migraine. May repeat in 2 hours if needed     Current Outpatient Medications (Other):  .  cholecalciferol (VITAMIN D) 1000 units tablet, Take 2,000 Units by mouth daily. .  Diclofenac Sodium (PENNSAID) 2 % SOLN, Place 1 application onto the skin 2 (two) times daily. .  Flaxseed, Linseed, (FLAX SEED OIL) 1000 MG CAPS, Take 1,000 mg by mouth. .  Multiple Vitamin (MULTIVITAMIN) tablet,  Take 1 tablet by mouth daily. .  Probiotic Product (PROBIOTIC DAILY PO), Take by mouth.     Past medical history, social, surgical and family history all reviewed in electronic medical record.  No pertanent information unless stated regarding to the chief complaint.   Review of Systems:  No headache, visual changes, nausea, vomiting, diarrhea, constipation, dizziness, abdominal pain, skin rash, fevers, chills, night sweats, weight loss, swollen lymph nodes, body aches, joint swelling, muscle aches, chest pain, shortness of breath, mood changes.   Objective  There were no vitals taken for this visit. Systems examined below as of    General: No apparent distress alert and oriented x3 mood and affect normal, dressed appropriately.  HEENT: Pupils equal, extraocular movements intact  Respiratory: Patient's speak in full sentences and does not appear short of breath  Cardiovascular: No lower extremity edema, non tender, no erythema  Skin: Warm dry intact with no signs of infection or rash on extremities or on axial skeleton.  Abdomen: Soft nontender  Neuro: Cranial nerves II through XII are intact, neurovascularly intact in all extremities with 2+ DTRs and 2+ pulses.  Lymph: No lymphadenopathy of posterior or anterior cervical chain or axillae bilaterally.  Gait normal with good balance and coordination.  MSK:  Non tender with full range of motion and good stability and symmetric strength and tone of shoulders, elbows, wrist, hip, knee and ankles bilaterally.  Neck: Inspection mild loss of lordosis. No palpable stepoffs. Negative Spurling's maneuver. Tightness mild loss of sidebending and rotation bilaterally Grip strength and sensation normal in bilateral hands Strength good C4 to T1 distribution No sensory change to C4 to T1 Negative Hoffman sign bilaterally Reflexes normal  Back Exam:  Inspection: Unremarkable  Motion: Flexion 40 deg, Extension 25 deg, Side Bending to 35 deg  bilaterally, Rotation to 35 deg bilaterally  SLR laying: Negative  XSLR laying: Negative  Palpable tenderness: Tender to palpation the paraspinal musculature.[name] FABER: Tightness bilaterally. Sensory change: Gross sensation intact to all lumbar and sacral dermatomes.  Reflexes: 2+ at both patellar tendons, 2+ at achilles tendons, Babinski's downgoing.  Strength at foot  Plantar-flexion: 5/5 Dorsi-flexion: 5/5 Eversion: 5/5 Inversion: 5/5  Leg strength  Quad: 5/5 Hamstring: 5/5 Hip flexor: 5/5 Hip abductors: 5/5  Gait unremarkable.  Osteopathic findings C2 flexed rotated and side bent right C4 flexed rotated and side bent left C6 flexed rotated and side bent left T3 extended rotated and side bent right inhaled third rib T9 extended rotated and side bent left L2 flexed rotated and side bent right Sacrum right on right    Impression and Recommendations:     This case  required medical decision making of moderate complexity. The above documentation has been reviewed and is accurate and complete [name], DO       Note: This dictation was prepared with Dragon dictation along with smaller phrase technology. Any transcriptional errors that result from this process are unintentional.

## 2018-06-17 ENCOUNTER — Encounter: Payer: Self-pay | Admitting: Family Medicine

## 2018-06-17 ENCOUNTER — Ambulatory Visit: Payer: 59 | Admitting: Family Medicine

## 2018-06-17 VITALS — BP 130/90 | HR 63 | Ht 62.0 in

## 2018-06-17 DIAGNOSIS — M999 Biomechanical lesion, unspecified: Secondary | ICD-10-CM | POA: Diagnosis not present

## 2018-06-17 DIAGNOSIS — M542 Cervicalgia: Secondary | ICD-10-CM | POA: Diagnosis not present

## 2018-06-17 NOTE — Patient Instructions (Signed)
Good to see you  Ice is your friend Stay active One knee down one knee up tilt pelvis forward.  Hands overhead then rotate to upper leg.  Hold 10 seconds, relax, repeat and do other side as well.  Very important when after being in a flexed position for a long amount of time.  1 day of strength, 1 day of yoga then all pool! See me again in 5 weeks

## 2018-06-17 NOTE — Assessment & Plan Note (Signed)
Decision today to treat with OMT was based on Physical Exam  After verbal consent patient was treated with HVLA, ME, FPR techniques in cervical, thoracic, rib,  lumbar and sacral areas  Patient tolerated the procedure well with improvement in symptoms  Patient given exercises, stretches and lifestyle modifications  See medications in patient instructions if given  Patient will follow up in 4-8 weeks 

## 2018-06-17 NOTE — Assessment & Plan Note (Signed)
Still tightness.  Does have significant difficulty with cervicogenic headaches and migraines as well from time to time.  This also causes patient to have more back pain as well.  Patient responds well though to manipulation.  Discussed continuing the conservative therapy.  No significant medication changes.  Follow-up again in 4 to 8 weeks

## 2018-07-08 ENCOUNTER — Ambulatory Visit: Payer: 59 | Admitting: Internal Medicine

## 2018-07-08 ENCOUNTER — Other Ambulatory Visit: Payer: Self-pay

## 2018-07-08 ENCOUNTER — Encounter: Payer: Self-pay | Admitting: Internal Medicine

## 2018-07-08 VITALS — BP 120/80 | HR 74 | Temp 98.3°F | Resp 16 | Ht 62.0 in | Wt 157.8 lb

## 2018-07-08 DIAGNOSIS — J4520 Mild intermittent asthma, uncomplicated: Secondary | ICD-10-CM | POA: Diagnosis not present

## 2018-07-08 DIAGNOSIS — B349 Viral infection, unspecified: Secondary | ICD-10-CM | POA: Diagnosis not present

## 2018-07-08 MED ORDER — ALBUTEROL SULFATE HFA 108 (90 BASE) MCG/ACT IN AERS: 1.0000 | INHALATION_SPRAY | Freq: Four times a day (QID) | RESPIRATORY_TRACT | 5 refills | Status: DC | PRN

## 2018-07-08 MED ORDER — HYDROCODONE-HOMATROPINE 5-1.5 MG/5ML PO SYRP: 5.0000 mL | ORAL_SOLUTION | Freq: Three times a day (TID) | ORAL | 0 refills | Status: DC | PRN

## 2018-07-08 MED FILL — HYDROCODONE-HOMATROPINE SYR: 5-1.5 | 8 days supply | Qty: 120 | Fill #0

## 2018-07-08 MED FILL — VENTOLIN HFA 90 MCG INHALER: 108 (90 BAS | 30 days supply | Qty: 18 | Fill #0

## 2018-07-08 NOTE — Patient Instructions (Addendum)
You likely a viral infection.   Take the cough syrup as needed.  Take an allergy medication.  Use your albuterol inhaler as needed. Continue the saline nasal spray.    Call if no improvement

## 2018-07-08 NOTE — Progress Notes (Signed)
Subjective:    Patient ID: Melanie [name], female    DOB: 24-Sep-1960, 58 y.o.   MRN: [id number]  HPI She is here for an acute visit for cold symptoms.   Her symptoms started last night.   She is experiencing some nasal congestion that started a couple of days ago, but the rest of her symptoms started last night.  She states nausea and diarrhea.  The diarrhea has improved, but she still has some mild nausea and decreased appetite.  She has chills, ear pain, sinus pain, sore throat, mild chest tightness, dry cough, abdominal cramping, body aches, headaches and lightheadedness.  She denies any fever, shortness of breath and wheezing.    She has tried taking flonase, saline nasal spray.  Has not needed the albuterol.     Medications and allergies reviewed with patient and updated if appropriate.  Patient Active Problem List   Diagnosis Date Noted  . Nonallopathic lesion of rib cage 01/28/2018  . Nonallopathic lesion of sacral region 12/24/2017  . ETD (Eustachian tube dysfunction), left 07/10/2017  . Greater trochanteric bursitis, right 06/25/2017  . Migraine headache 02/17/2017  . Intercostal pain 02/03/2017  . Polyarthralgia 08/23/2015  . Asthma with acute exacerbation 02/28/2015  . Quadriceps tendinitis 11/16/2014  . Neck pain 07/07/2014  . Concussion with no loss of consciousness 07/07/2014  . Nonallopathic lesion of lumbosacral region 11/04/2013  . Nonallopathic lesion of thoracic region 11/04/2013  . Piriformis syndrome of left side 10/14/2013  . Greater trochanteric bursitis of left hip 10/14/2013  . Trapezius muscle spasm 11/26/2012  . Nonallopathic lesion of cervicothoracic region 11/26/2012  . Seasonal allergies 09/28/2012  . Asthma attack 12/01/2011  . Backache 09/26/2009  . OTHER ACQUIRED DEFORMITY OF ANKLE AND FOOT OTHER 09/26/2009  . ABNORMALITY OF GAIT 09/26/2009  . KNEE PAIN, RIGHT 08/08/2009  . TENDINITIS, PATELLAR 08/08/2009    Current Outpatient  Medications on File Prior to Visit  Medication Sig Dispense Refill  . albuterol (PROAIR HFA) 108 (90 Base) MCG/ACT inhaler Inhale 1 puff into the lungs every 6 (six) hours as needed for wheezing or shortness of breath. 1 Inhaler 5  . azelastine (ASTELIN) 0.1 % nasal spray Place 1 spray into both nostrils 2 (two) times daily. 30 mL 5  . beclomethasone (QVAR) 40 MCG/ACT inhaler Inhale 1 puff into the lungs 2 (two) times daily. Yearly physical due in April must see MD for refills 1 Inhaler 3  . benzonatate (TESSALON PERLES) 100 MG capsule Take 1 capsule (100 mg total) by mouth 3 (three) times daily as needed. 20 capsule 0  . cholecalciferol (VITAMIN D) 1000 units tablet Take 2,000 Units by mouth daily.    . Diclofenac Sodium (PENNSAID) 2 % SOLN Place 1 application onto the skin 2 (two) times daily. 1 Bottle 2  . Flaxseed, Linseed, (FLAX SEED OIL) 1000 MG CAPS Take 1,000 mg by mouth.    . fluticasone (FLONASE) 50 MCG/ACT nasal spray Place 2 sprays into both nostrils daily. 16 g 6  . ipratropium (ATROVENT) 0.03 % nasal spray Place 2 sprays into both nostrils every 12 (twelve) hours. 30 mL 12  . loratadine (CLARITIN) 10 MG tablet Take 10 mg by mouth daily.    . Multiple Vitamin (MULTIVITAMIN) tablet Take 1 tablet by mouth daily.    . Probiotic Product (PROBIOTIC DAILY PO) Take by mouth.    . rizatriptan (MAXALT-MLT) 10 MG disintegrating tablet Take 1 tablet (10 mg total) by mouth as needed for migraine. May repeat  in 2 hours if needed 10 tablet 1   Current Facility-Administered Medications on File Prior to Visit  Medication Dose Route Frequency Provider Last Rate Last Dose  . ipratropium-albuterol (DUONEB) 0.5-2.5 (3) MG/3ML nebulizer solution 3 mL  3 mL Nebulization Once Nche, Bonna Gainsharlotte Lum, NP        Past Medical History:  Diagnosis Date  . Allergy   . Asthma   . Osteoporosis     Past Surgical History:  Procedure Laterality Date  . ABDOMINAL HYSTERECTOMY    . APPENDECTOMY    . KNEE  SURGERY      Social History   Socioeconomic History  . Marital status: Divorced    Spouse name: Not on file  . Number of children: Not on file  . Years of education: Not on file  . Highest education level: Not on file  Occupational History  . Not on file  Social Needs  . Financial resource strain: Not on file  . Food insecurity:    Worry: Not on file    Inability: Not on file  . Transportation needs:    Medical: Not on file    Non-medical: Not on file  Tobacco Use  . Smoking status: Never Smoker  . Smokeless tobacco: Never Used  Substance and Sexual Activity  . Alcohol use: Yes    Comment: 2/wk  . Drug use: No  . Sexual activity: Not on file  Lifestyle  . Physical activity:    Days per week: Not on file    Minutes per session: Not on file  . Stress: Not on file  Relationships  . Social connections:    Talks on phone: Not on file    Gets together: Not on file    Attends religious service: Not on file    Active member of club or organization: Not on file    Attends meetings of clubs or organizations: Not on file    Relationship status: Not on file  Other Topics Concern  . Not on file  Social History Narrative   Exercises regularly    Family History  Problem Relation Age of Onset  . Heart disease Father [id number]  . Irregular heart beat Mother   . Stroke Maternal Grandmother   . Heart attack Maternal Grandfather   . Stroke Paternal Grandmother   . Heart attack Paternal Grandfather   . Heart attack Brother 45  . Breast cancer Neg Hx     Review of Systems  Constitutional: Positive for appetite change and chills. Negative for fever.  HENT: Positive for congestion, ear pain, sinus pain and sore throat.   Respiratory: Positive for cough (dry, deep) and chest tightness. Negative for shortness of breath and wheezing.   Gastrointestinal: Positive for abdominal pain (cramping), diarrhea and nausea.  Musculoskeletal: Positive for myalgias.  Neurological: Positive for  light-headedness and headaches.       Objective:   Vitals:   07/08/18 1030  BP: 120/80  Pulse: 74  Resp: 16  Temp: 98.3 F (36.8 C)  SpO2: 99%   Filed Weights   07/08/18 1030  Weight: 157 lb 12.8 oz (71.6 kg)   Body mass index is 28.86 kg/m.  Wt Readings from Last 3 Encounters:  07/08/18 157 lb 12.8 oz (71.6 kg)  04/29/18 159 lb (72.1 kg)  03/18/18 161 lb (73 kg)     Physical Exam GENERAL APPEARANCE: Appears stated age, well appearing, NAD EYES: conjunctiva clear, no icterus HEENT: bilateral tympanic membranes and ear canals normal, oropharynx  with no erythema, no thyromegaly, trachea midline, no cervical or supraclavicular lymphadenopathy LUNGS: Clear to auscultation without wheeze or crackles, unlabored breathing, good air entry bilaterally CARDIOVASCULAR: Normal S1,S2 without murmurs, no edema SKIN: warm, dry        Assessment & Plan:   See Problem List for Assessment and Plan of chronic medical problems.

## 2018-07-08 NOTE — Assessment & Plan Note (Signed)
Symptoms consistent with viral illness Unlikely flu given that she feels reasonably well and without fever Symptomatic treatment discussed Hycodan cough syrup Flonase, saline nasal spray, allergy medication Over-the-counter cold medications as needed Use albuterol as needed Rest, fluids Call if no improvement

## 2018-07-08 NOTE — Assessment & Plan Note (Signed)
Mild intermittent asthma without complication Uses albuterol inhaler as needed No evidence of asthma exacerbation with current viral illness We will renew albuterol to use if needed

## 2018-07-12 ENCOUNTER — Encounter: Payer: Self-pay | Admitting: Internal Medicine

## 2018-07-12 ENCOUNTER — Ambulatory Visit: Payer: 59 | Admitting: Internal Medicine

## 2018-07-12 VITALS — BP 118/64 | HR 90 | Temp 98.5°F | Resp 16 | Ht 62.0 in | Wt 158.8 lb

## 2018-07-12 DIAGNOSIS — J019 Acute sinusitis, unspecified: Secondary | ICD-10-CM | POA: Insufficient documentation

## 2018-07-12 DIAGNOSIS — J0100 Acute maxillary sinusitis, unspecified: Secondary | ICD-10-CM | POA: Diagnosis not present

## 2018-07-12 DIAGNOSIS — J45901 Unspecified asthma with (acute) exacerbation: Secondary | ICD-10-CM | POA: Diagnosis not present

## 2018-07-12 MED ORDER — METHYLPREDNISOLONE 4 MG PO TBPK: ORAL_TABLET | ORAL | 0 refills | Status: DC

## 2018-07-12 MED ORDER — FLUTICASONE PROPIONATE HFA 110 MCG/ACT IN AERO: 1.0000 | INHALATION_SPRAY | Freq: Two times a day (BID) | RESPIRATORY_TRACT | 12 refills | Status: DC

## 2018-07-12 MED ORDER — AZITHROMYCIN 250 MG PO TABS: ORAL_TABLET | ORAL | 0 refills | Status: DC

## 2018-07-12 MED FILL — FLOVENT HFA 110 MCG INHALER: 110 | 30 days supply | Qty: 12 | Fill #0

## 2018-07-12 MED FILL — AZITHROMYCIN 250 MG TABLET: 250 | 5 days supply | Qty: 6 | Fill #0

## 2018-07-12 MED FILL — METHYLPREDNISOLONE 4 MG TAB: 4 | 6 days supply | Qty: 21 | Fill #0

## 2018-07-12 NOTE — Progress Notes (Signed)
Subjective:    Patient ID: Melanie [name], female    DOB: July 13, 1960, 58 y.o.   MRN: [id number]  HPI The patient is here for an acute visit.  She was seen here by myself 4 days ago for cold symptoms.  She has had symptoms for a couple of days, but most of them started the night prior.  Her symptoms sounded viral in nature and prescribed Hycodan cough syrup and advised her to continue her inhaler and allergy medications.  She is still having chills, nasal congestion, ear pain, postnasal drip, sinus pain, cough that is minimally productive of clear sputum, wheezing, nausea, sinus headaches and lightheadedness.  She denies fever, shortness of breath, chest tightness and her diarrhea has resolved.  She is using a cough syrup, taking albuterol inhaler and any cold medications.   Medications and allergies reviewed with patient and updated if appropriate.  Patient Active Problem List   Diagnosis Date Noted  . Viral illness 07/08/2018  . Mild intermittent asthma without complication 07/08/2018  . Nonallopathic lesion of rib cage 01/28/2018  . Nonallopathic lesion of sacral region 12/24/2017  . ETD (Eustachian tube dysfunction), left 07/10/2017  . Greater trochanteric bursitis, right 06/25/2017  . Migraine headache 02/17/2017  . Intercostal pain 02/03/2017  . Polyarthralgia 08/23/2015  . Asthma with acute exacerbation 02/28/2015  . Quadriceps tendinitis 11/16/2014  . Neck pain 07/07/2014  . Concussion with no loss of consciousness 07/07/2014  . Nonallopathic lesion of lumbosacral region 11/04/2013  . Nonallopathic lesion of thoracic region 11/04/2013  . Piriformis syndrome of left side 10/14/2013  . Greater trochanteric bursitis of left hip 10/14/2013  . Trapezius muscle spasm 11/26/2012  . Nonallopathic lesion of cervicothoracic region 11/26/2012  . Seasonal allergies 09/28/2012  . Asthma attack 12/01/2011  . Backache 09/26/2009  . OTHER ACQUIRED DEFORMITY OF ANKLE AND FOOT  OTHER 09/26/2009  . ABNORMALITY OF GAIT 09/26/2009  . KNEE PAIN, RIGHT 08/08/2009  . TENDINITIS, PATELLAR 08/08/2009    Current Outpatient Medications on File Prior to Visit  Medication Sig Dispense Refill  . albuterol (PROAIR HFA) 108 (90 Base) MCG/ACT inhaler Inhale 1 puff into the lungs every 6 (six) hours as needed for wheezing or shortness of breath. 1 Inhaler 5  . azelastine (ASTELIN) 0.1 % nasal spray Place 1 spray into both nostrils 2 (two) times daily. 30 mL 5  . beclomethasone (QVAR) 40 MCG/ACT inhaler Inhale 1 puff into the lungs 2 (two) times daily. Yearly physical due in April must see MD for refills 1 Inhaler 3  . benzonatate (TESSALON PERLES) 100 MG capsule Take 1 capsule (100 mg total) by mouth 3 (three) times daily as needed. 20 capsule 0  . cholecalciferol (VITAMIN D) 1000 units tablet Take 2,000 Units by mouth daily.    . Flaxseed, Linseed, (FLAX SEED OIL) 1000 MG CAPS Take 1,000 mg by mouth.    . fluticasone (FLONASE) 50 MCG/ACT nasal spray Place 2 sprays into both nostrils daily. 16 g 6  . HYDROcodone-homatropine (HYCODAN) 5-1.5 MG/5ML syrup Take 5 mLs by mouth every 8 (eight) hours as needed for cough. 120 mL 0  . ipratropium (ATROVENT) 0.03 % nasal spray Place 2 sprays into both nostrils every 12 (twelve) hours. 30 mL 12  . loratadine (CLARITIN) 10 MG tablet Take 10 mg by mouth daily.    . Multiple Vitamin (MULTIVITAMIN) tablet Take 1 tablet by mouth daily.    . Probiotic Product (PROBIOTIC DAILY PO) Take by mouth.    . rizatriptan (  MAXALT-MLT) 10 MG disintegrating tablet Take 1 tablet (10 mg total) by mouth as needed for migraine. May repeat in 2 hours if needed 10 tablet 1   Current Facility-Administered Medications on File Prior to Visit  Medication Dose Route Frequency Provider Last Rate Last Dose  . ipratropium-albuterol (DUONEB) 0.5-2.5 (3) MG/3ML nebulizer solution 3 mL  3 mL Nebulization Once Nche, Bonna Gains, NP        Past Medical History:  Diagnosis  Date  . Allergy   . Asthma   . Osteoporosis     Past Surgical History:  Procedure Laterality Date  . ABDOMINAL HYSTERECTOMY    . APPENDECTOMY    . KNEE SURGERY      Social History   Socioeconomic History  . Marital status: Divorced    Spouse name: Not on file  . Number of children: Not on file  . Years of education: Not on file  . Highest education level: Not on file  Occupational History  . Not on file  Social Needs  . Financial resource strain: Not on file  . Food insecurity:    Worry: Not on file    Inability: Not on file  . Transportation needs:    Medical: Not on file    Non-medical: Not on file  Tobacco Use  . Smoking status: Never Smoker  . Smokeless tobacco: Never Used  Substance and Sexual Activity  . Alcohol use: Yes    Comment: 2/wk  . Drug use: No  . Sexual activity: Not on file  Lifestyle  . Physical activity:    Days per week: Not on file    Minutes per session: Not on file  . Stress: Not on file  Relationships  . Social connections:    Talks on phone: Not on file    Gets together: Not on file    Attends religious service: Not on file    Active member of club or organization: Not on file    Attends meetings of clubs or organizations: Not on file    Relationship status: Not on file  Other Topics Concern  . Not on file  Social History Narrative   Exercises regularly    Family History  Problem Relation Age of Onset  . Heart disease Father 4  . Irregular heart beat Mother   . Stroke Maternal Grandmother   . Heart attack Maternal Grandfather   . Stroke Paternal Grandmother   . Heart attack Paternal Grandfather   . Heart attack Brother 45  . Breast cancer Neg Hx     Review of Systems  Constitutional: Positive for chills. Negative for fever.  HENT: Positive for congestion, ear pain, postnasal drip and sinus pain. Negative for sore throat.   Respiratory: Positive for cough (minimal production - clear) and wheezing. Negative for chest  tightness and shortness of breath.   Gastrointestinal: Positive for nausea. Negative for diarrhea.  Musculoskeletal: Negative for myalgias.  Neurological: Positive for light-headedness and headaches (sinus).       Objective:   Vitals:   07/12/18 1258  BP: 118/64  Pulse: 90  Resp: 16  Temp: 98.5 F (36.9 C)  SpO2: 99%   BP Readings from Last 3 Encounters:  07/12/18 118/64  07/08/18 120/80  06/17/18 130/90   Wt Readings from Last 3 Encounters:  07/12/18 158 lb 12.8 oz (72 kg)  07/08/18 157 lb 12.8 oz (71.6 kg)  04/29/18 159 lb (72.1 kg)   Body mass index is 29.04 kg/m.   Physical  Exam    GENERAL APPEARANCE: Appears stated age, well appearing, NAD EYES: conjunctiva clear, no icterus HEENT: bilateral tympanic membranes and ear canals normal, oropharynx with no erythema, no thyromegaly, trachea midline, no cervical or supraclavicular lymphadenopathy LUNGS: Clear to auscultation without wheeze or crackles, unlabored breathing, good air entry bilaterally CARDIOVASCULAR: Normal S1,S2 without murmurs, no edema SKIN: Warm, dry      Assessment & Plan:    See Problem List for Assessment and Plan of chronic medical problems.

## 2018-07-12 NOTE — Patient Instructions (Signed)
Use the flovent inhaler twice a day until your symptoms resolve.  Continue the albuterol as needed.   Take the medrol dose pak.  Take the zpak.  Use the cough syrup as needed.      Call if no improvement

## 2018-07-12 NOTE — Assessment & Plan Note (Signed)
Likely bacterial  Start zpak Prescription cough syrup otc cold medications Rest, fluid Call if no improvement  

## 2018-07-12 NOTE — Assessment & Plan Note (Signed)
Related to sinus infection Start zpak given PCN allergy Medrol dose pak Start flovent BID Albuterol prn  Call if no improvement

## 2018-07-22 ENCOUNTER — Encounter: Payer: Self-pay | Admitting: Family Medicine

## 2018-07-22 ENCOUNTER — Ambulatory Visit: Payer: 59 | Admitting: Family Medicine

## 2018-07-22 VITALS — BP 140/90 | HR 84 | Ht 62.0 in

## 2018-07-22 DIAGNOSIS — M9981 Other biomechanical lesions of cervical region: Secondary | ICD-10-CM | POA: Diagnosis not present

## 2018-07-22 DIAGNOSIS — M999 Biomechanical lesion, unspecified: Secondary | ICD-10-CM | POA: Diagnosis not present

## 2018-07-22 DIAGNOSIS — M542 Cervicalgia: Secondary | ICD-10-CM

## 2018-07-22 NOTE — Patient Instructions (Signed)
Good to see you  Melanie [name] is your friend Become more active.  Get back to the pool after the weekend Happy early birthday! See me again in 6 weeks

## 2018-07-22 NOTE — Assessment & Plan Note (Signed)
Decision today to treat with OMT was based on Physical Exam  After verbal consent patient was treated with HVLA, ME, FPR techniques in cervical, thoracic, rib, lumbar and sacral areas  Patient tolerated the procedure well with improvement in symptoms  Patient given exercises, stretches and lifestyle modifications  See medications in patient instructions if given  Patient will follow up in 4-6 weeks 

## 2018-07-22 NOTE — Progress Notes (Signed)
Tawana ScaleZach Jennetta Flood D.O. [facility] [address] [place], [place] [address] Phone: [phone] Subjective:    I Ronelle NighKana Thompson am serving as a Neurosurgeonscribe for Dr. Antoine PrimasZachary Barbarita Hutmacher.  CC: Back pain and neck pain  BJY:[id number]:Subjective  Melanie [name] is a 58 y.o. female coming in with complaint of back pain. States she is doing well. Neck feels "crunchy".  Patient has recently got over a cold and to do a lot of coughing.  Having some mild increase in upper back pain.  Describes the pain as a dull, throbbing aching sensation.    Past Medical History:  Diagnosis Date  . Allergy   . Asthma   . Osteoporosis    Past Surgical History:  Procedure Laterality Date  . ABDOMINAL HYSTERECTOMY    . APPENDECTOMY    . KNEE SURGERY     Social History   Socioeconomic History  . Marital status: Divorced    Spouse name: Not on file  . Number of children: Not on file  . Years of education: Not on file  . Highest education level: Not on file  Occupational History  . Not on file  Social Needs  . Financial resource strain: Not on file  . Food insecurity:    Worry: Not on file    Inability: Not on file  . Transportation needs:    Medical: Not on file    Non-medical: Not on file  Tobacco Use  . Smoking status: Never Smoker  . Smokeless tobacco: Never Used  Substance and Sexual Activity  . Alcohol use: Yes    Comment: 2/wk  . Drug use: No  . Sexual activity: Not on file  Lifestyle  . Physical activity:    Days per week: Not on file    Minutes per session: Not on file  . Stress: Not on file  Relationships  . Social connections:    Talks on phone: Not on file    Gets together: Not on file    Attends religious service: Not on file    Active member of club or organization: Not on file    Attends meetings of clubs or organizations: Not on file    Relationship status: Not on file  Other Topics Concern  . Not on file  Social History Narrative   Exercises regularly   Allergies  Allergen  Reactions  . Kiwi Extract Hives, Shortness Of Breath and Itching  . Latex   . Penicillins   . Sulfonamide Derivatives    Family History  Problem Relation Age of Onset  . Heart disease Father [id number]  . Irregular heart beat Mother   . Stroke Maternal Grandmother   . Heart attack Maternal Grandfather   . Stroke Paternal Grandmother   . Heart attack Paternal Grandfather   . Heart attack Brother 45  . Breast cancer Neg Hx     Current Outpatient Medications (Endocrine & Metabolic):  .  methylPREDNISolone (MEDROL DOSEPAK) 4 MG TBPK tablet, 24 mg PO on day 1, then decr. by 4 mg/day x5 days   Current Outpatient Medications (Respiratory):  .  albuterol (PROAIR HFA) 108 (90 Base) MCG/ACT inhaler, Inhale 1 puff into the lungs every 6 (six) hours as needed for wheezing or shortness of breath. [name].  azelastine (ASTELIN) 0.1 % nasal spray, Place 1 spray into both nostrils 2 (two) times daily. .  benzonatate (TESSALON PERLES) 100 MG capsule, Take 1 capsule (100 mg total) by mouth 3 (three) times daily as needed. .[name]  fluticasone (FLONASE) 50 MCG/ACT nasal spray, Place 2 sprays into both nostrils daily. .  fluticasone (FLOVENT HFA) 110 MCG/ACT inhaler, Inhale 1 puff into the lungs 2 (two) times daily. [name]  HYDROcodone-homatropine (HYCODAN) 5-1.5 MG/5ML syrup, Take 5 mLs by mouth every 8 (eight) hours as needed for cough. [name]  ipratropium (ATROVENT) 0.03 % nasal spray, Place 2 sprays into both nostrils every 12 (twelve) hours. [name]  loratadine (CLARITIN) 10 MG tablet, Take 10 mg by mouth daily.  Current Outpatient Medications (Analgesics):  .  rizatriptan (MAXALT-MLT) 10 MG disintegrating tablet, Take 1 tablet (10 mg total) by mouth as needed for migraine. May repeat in 2 hours if needed   Current Outpatient Medications (Other):  .  azithromycin (ZITHROMAX) 250 MG tablet, Take two tabs the first day and then one tab daily for four days .  cholecalciferol (VITAMIN D) 1000 units tablet, Take 2,000 Units by mouth  daily. .  Flaxseed, Linseed, (FLAX SEED OIL) 1000 MG CAPS, Take 1,000 mg by mouth. .  Multiple Vitamin (MULTIVITAMIN) tablet, Take 1 tablet by mouth daily. .  Probiotic Product (PROBIOTIC DAILY PO), Take by mouth.    Past medical history, social, surgical and family history all reviewed in electronic medical record.  No pertanent information unless stated regarding to the chief complaint.   Review of Systems:  No headache, visual changes, nausea, vomiting, diarrhea, constipation, dizziness, abdominal pain, skin rash, fevers, chills, night sweats, weight loss, swollen lymph nodes, body aches, joint swelling,  chest pain, shortness of breath, mood changes.  Positive muscle aches  Objective  Blood pressure 140/90, pulse 84, height 5\' 2"  (1.575 m), SpO2 94 %.   General: No apparent distress alert and oriented x3 mood and affect normal, dressed appropriately.  HEENT: Pupils equal, extraocular movements intact  Respiratory: Patient's speak in full sentences and does not appear short of breath  Cardiovascular: No lower extremity edema, non tender, no erythema  Skin: Warm dry intact with no signs of infection or rash on extremities or on axial skeleton.  Abdomen: Soft nontender  Neuro: Cranial nerves II through XII are intact, neurovascularly intact in all extremities with 2+ DTRs and 2+ pulses.  Lymph: No lymphadenopathy of posterior or anterior cervical chain or axillae bilaterally.  Gait normal with good balance and coordination.  MSK:  Non tender with full range of motion and good stability and symmetric strength and tone of shoulders, elbows, wrist, hip, knee and ankles bilaterally.  Neck: Inspection loss of lordosis. No palpable stepoffs. Negative Spurling's maneuver. Mild decrease in range of motion especially with sidebending and 5 degrees bilaterally Grip strength and sensation normal in bilateral hands Strength good C4 to T1 distribution No sensory change to C4 to T1 Negative  Hoffman sign bilaterally Reflexes normal Tightness in the trapezius bilaterally  Back Exam:  Inspection: Unremarkable  Motion: Flexion 25 deg, Extension 20 deg, Side Bending to 15 deg bilaterally, Rotation to 25 deg bilaterally  SLR laying: Negative  XSLR laying: Negative  Palpable tenderness: To palpation the paraspinal musculature lumbar spine right greater than left. FABER: negative. Sensory change: Gross sensation intact to all lumbar and sacral dermatomes.  Reflexes: 2+ at both patellar tendons, 2+ at achilles tendons, Babinski's downgoing.  Strength at foot  Plantar-flexion: 5/5 Dorsi-flexion: 5/5 Eversion: 5/5 Inversion: 5/5  Leg strength  Quad: 5/5 Hamstring: 5/5 Hip flexor: 5/5 Hip abductors: 5/5  Gait unremarkable.  Osteopathic findings C4 flexed rotated and side bent left C6 flexed rotated and side bent left T3 extended  rotated and side bent right inhaled third rib T9 extended rotated and side bent left L2 flexed rotated and side bent right Sacrum right on right    Impression and Recommendations:     This case required medical decision making of moderate complexity. The above documentation has been reviewed and is accurate and complete Judi SaaZachary M Yunis Voorheis, DO       Note: This dictation was prepared with Dragon dictation along with smaller phrase technology. Any transcriptional errors that result from this process are unintentional.

## 2018-07-22 NOTE — Assessment & Plan Note (Signed)
Continues to have discomfort and pain.  Discussed icing regimen and home exercise.  Discussed which activities to do which was to avoid.  Discussed posture and ergonomics.  Patient will follow-up with me again in 4 to 6 weeks.

## 2018-09-01 ENCOUNTER — Ambulatory Visit: Payer: 59 | Admitting: Family Medicine

## 2019-02-21 IMAGING — DX DG CHEST 2V
2 series · 2 of 2 positions shown · non-contrast
Comparison: None.

CLINICAL DATA: Mid chest pain for 3 days, history of asthma

EXAM:
CHEST  2 VIEW

[chest pa]
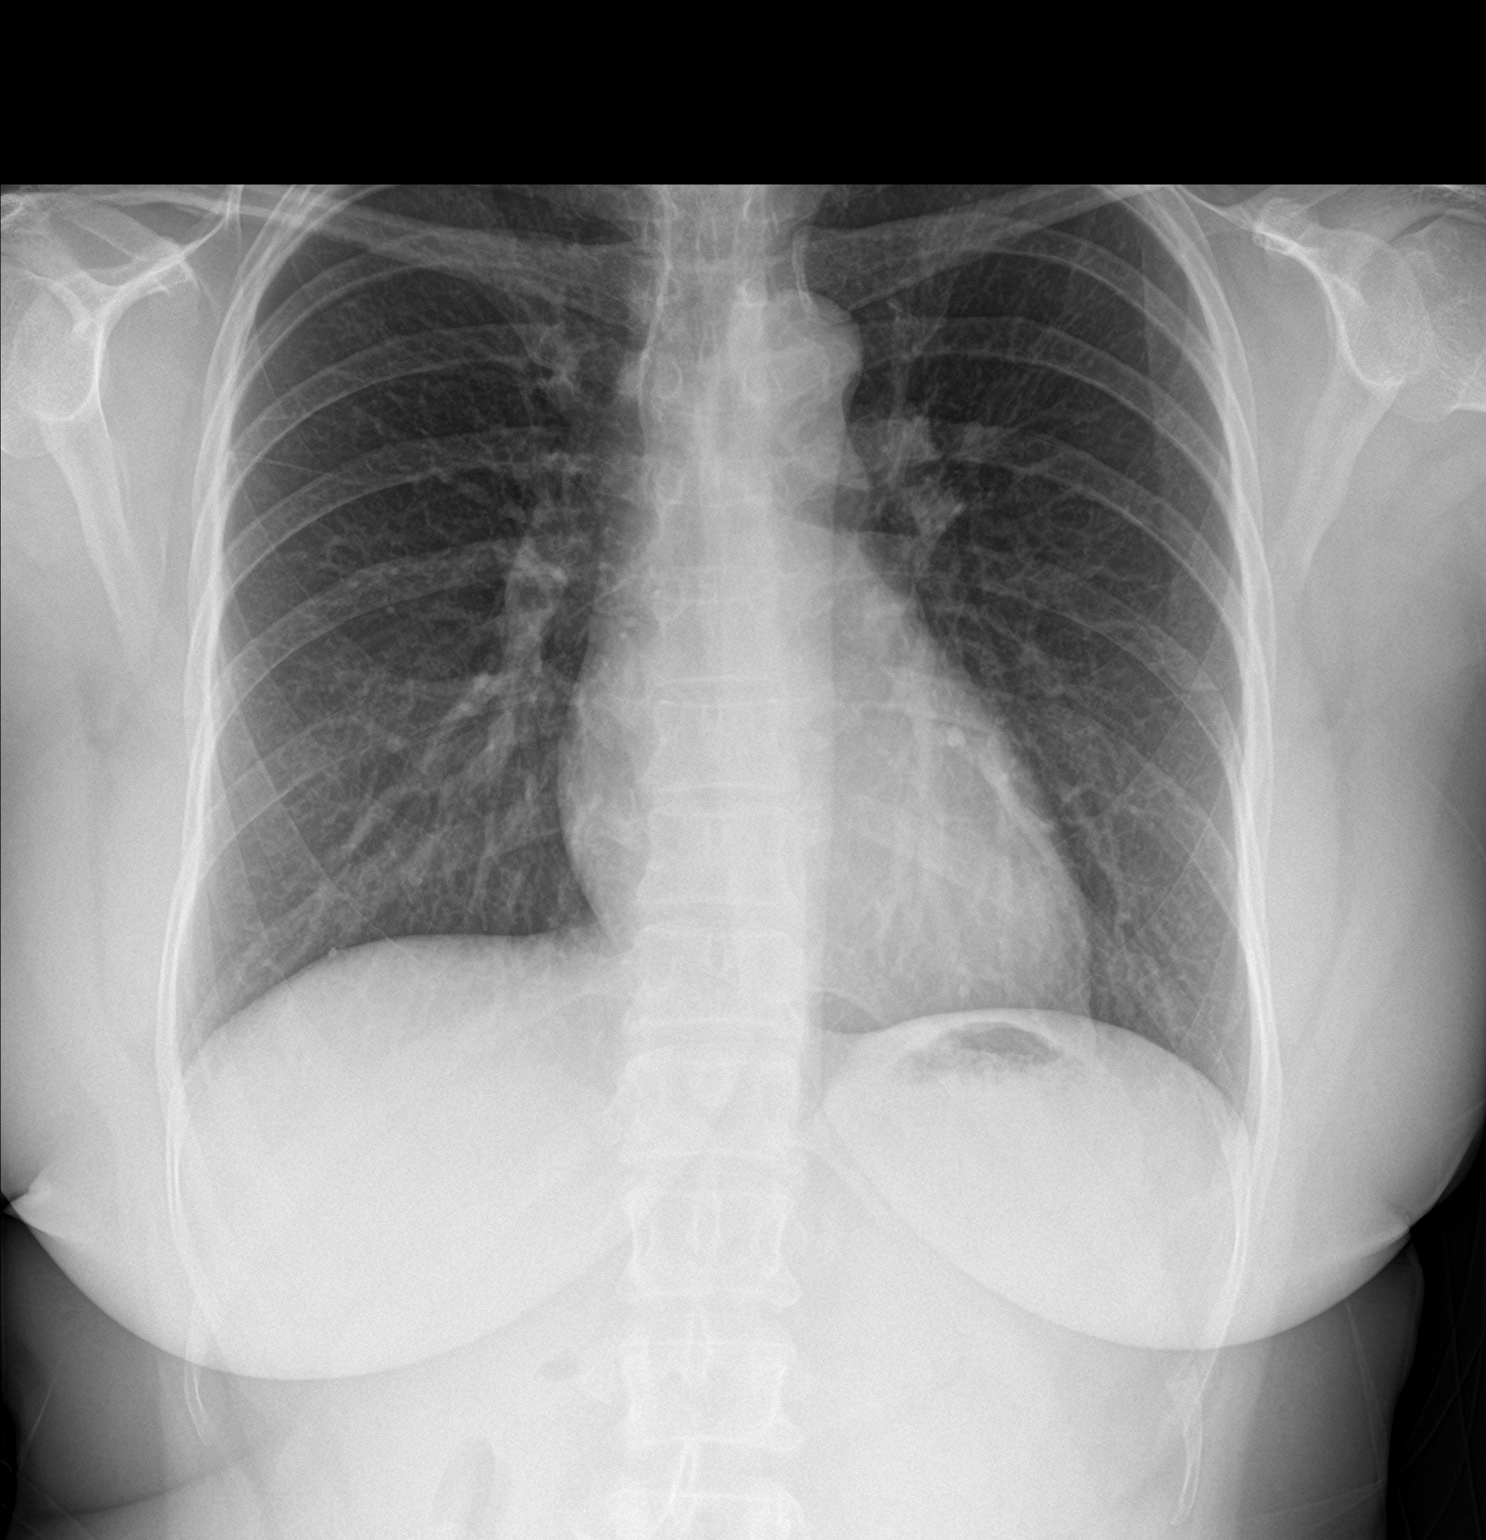

[chest lat]
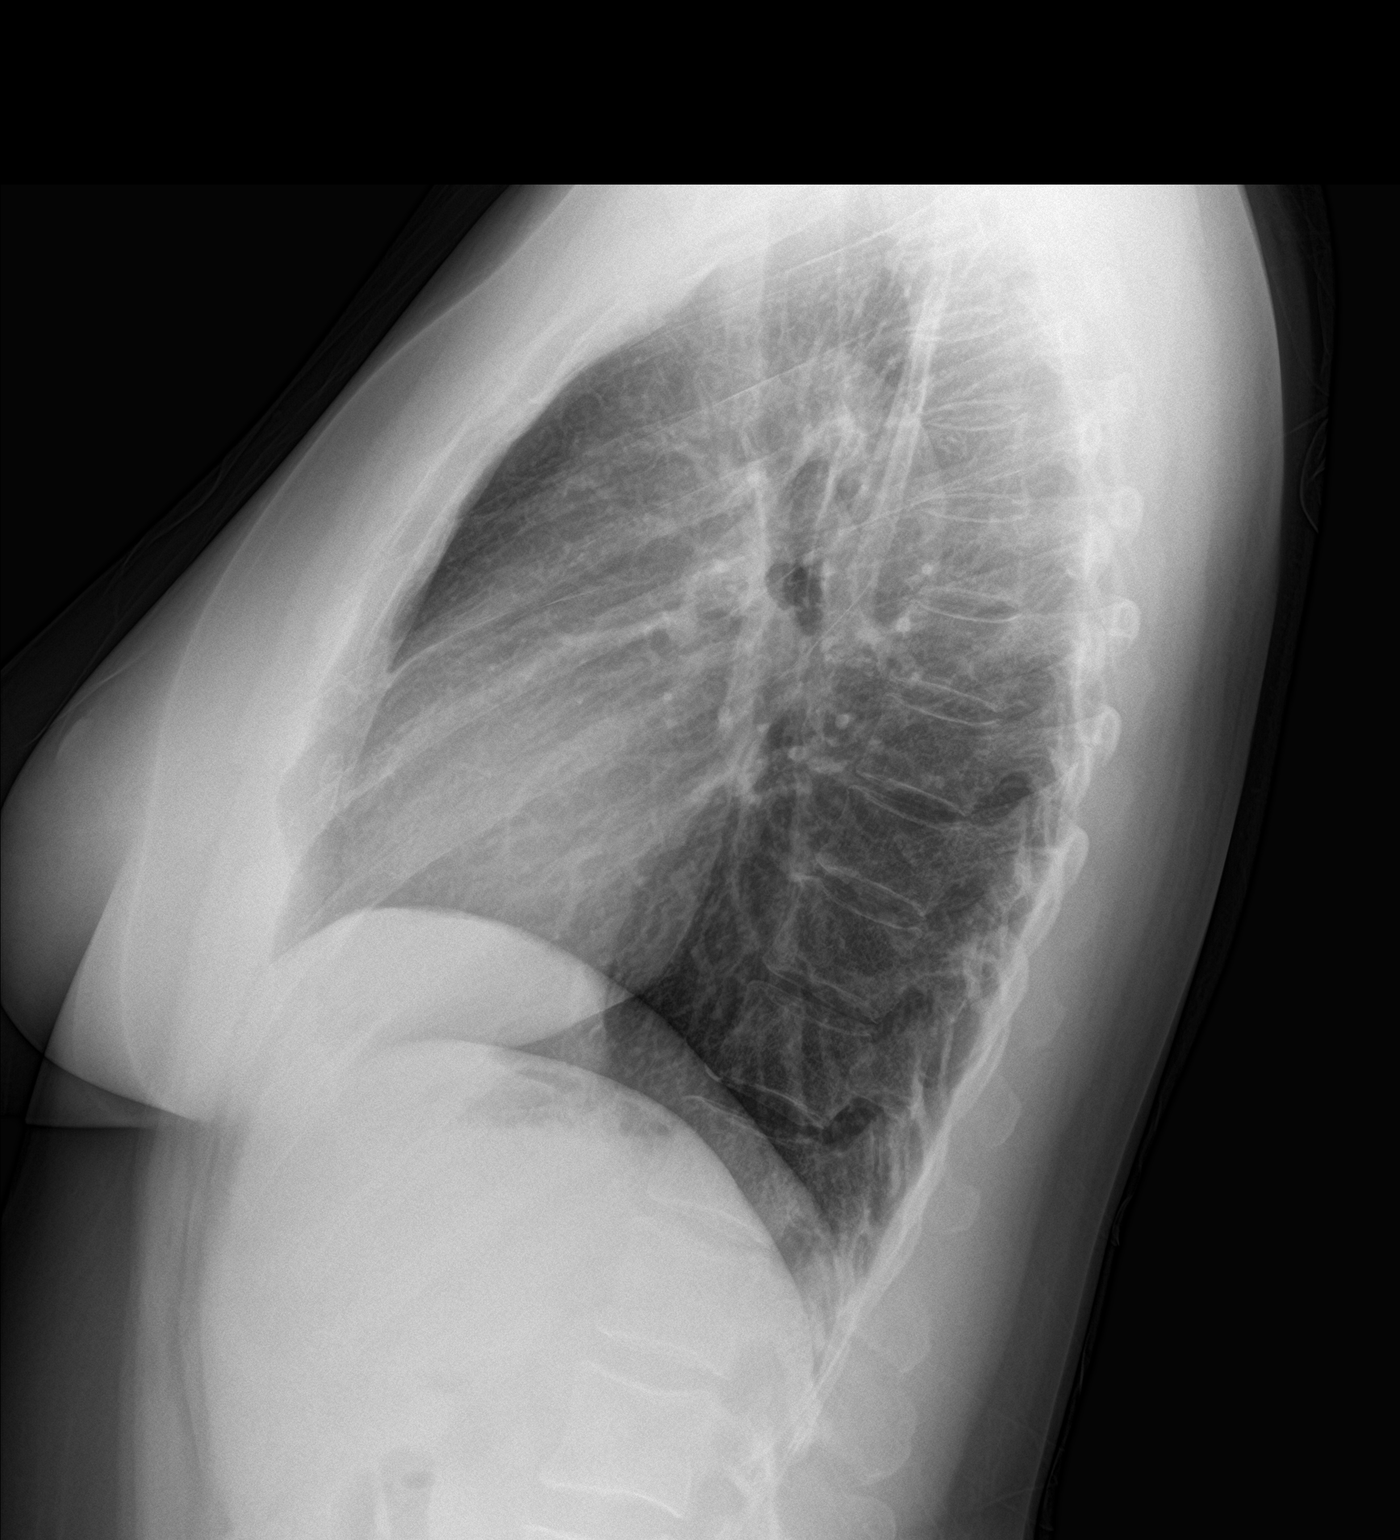

[2 of 2 positions shown; findings below may reference images not displayed]

FINDINGS: No pneumonia or effusion is seen. An opacity in the left suprahilar
region probably represents overlapping bony and vascular structures
but attention to this area on followup chest x-ray is recommended.
Mediastinal and hilar contours otherwise are unremarkable. The heart
is within normal limits in size. No bony abnormality is seen.
IMPRESSION: 1. No active lung disease.
2. Opacity in the left suprahilar region probably due to overlapping
bony and vascular structures. Recommend attention to this area
however on followup chest x-ray.

## 2019-02-26 ENCOUNTER — Other Ambulatory Visit: Payer: Self-pay

## 2019-02-26 DIAGNOSIS — Z20822 Contact with and (suspected) exposure to covid-19: Secondary | ICD-10-CM

## 2019-02-27 LAB — NOVEL CORONAVIRUS, NAA: SARS-CoV-2, NAA: NOT DETECTED

## 2019-10-18 ENCOUNTER — Encounter: Payer: Self-pay | Admitting: Family Medicine

## 2019-10-18 ENCOUNTER — Other Ambulatory Visit: Payer: Self-pay

## 2019-10-18 ENCOUNTER — Ambulatory Visit: Payer: 59 | Admitting: Family Medicine

## 2019-10-18 VITALS — BP 130/88 | HR 78 | Ht 62.0 in | Wt 161.6 lb

## 2019-10-18 DIAGNOSIS — S39012A Strain of muscle, fascia and tendon of lower back, initial encounter: Secondary | ICD-10-CM | POA: Diagnosis not present

## 2019-10-18 MED ORDER — CYCLOBENZAPRINE HCL 5 MG PO TABS: 5.0000 mg | ORAL_TABLET | Freq: Every evening | ORAL | 0 refills | Status: DC | PRN

## 2019-10-18 NOTE — Progress Notes (Signed)
I, Melanie [name], LAT, ATC, am serving as scribe for Dr. Lynne Leader.  Melanie [name] is a 59 y.o. female who presents to [place] [facility] today for f/u of back pain due to a recent injury she sustained two weekends ago when she lifted something too heavy.  She locates her pain to her low back L >R.  She rates her pain as moderate and describes his pain as throbbing.  Pain located mostly in left low back.  Her sister recently had a knee replacement and she has been helping her at home.  She thinks the extra lifting may be causing the pain.  She denies any radiating pain weakness or numbness distally.  She is tried some over-the-counter medications for pain which help a little.  In the past she had muscle relaxers that did help her sleep at least.  She knows she is having quite a bit of difficulty and is interested in low-dose muscle relaxer at bedtime if needed.  She is not had physical therapy for quite some time but has had some benefit with acupuncture in the past.  Radiating pain: No  LE numbness/tingling: No LE weakness: No Aggravating factors: quick motions; all lumbar motions are irritating Treatments tried: Tylenol; ice; heat; TENs   Pertinent review of systems: No fevers or chills  Relevant historical information: History of prior back pain.   Exam:  BP 130/88 (BP Location: Right Arm, Patient Position: Sitting, Cuff Size: Normal)   Pulse 78   Ht 5\' 2"  (1.575 m)   Wt 161 lb 9.6 oz (73.3 kg)   SpO2 99%   BMI 29.56 kg/m  General: Well Developed, well nourished, and in no acute distress.   MSK: L-spine normal-appearing.   Nontender midline.  Tender palpation left paraspinal musculature lumbar spine. Significant decreased range of motion especially with flexion. Lower extremity strength reflexes and sensation are intact and equal bilaterally. Normal gait.      Assessment and Plan: 59 y.o. 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  Plan to treat with home exercise program as taught in clinic today by ATC, heating pad TENS unit and very low-dose Flexeril.  Refer also to physical therapy which should be quite helpful.  Dry needling may be very helpful as part of physical therapy component as she had some benefit with acupuncture in the past.  Recheck back with myself or my partner Dr. Tamala Julian as needed.  She may benefit from OMT by Dr. Tamala Julian in future if not better also.  [id number]; 15 additional minutes spent for Therapeutic exercises as stated in above notes.  This included exercises focusing on stretching, strengthening, with significant focus on eccentric aspects.   Long term goals include an improvement in range of motion, strength, endurance as well as avoiding reinjury. Patient's frequency would include in 1-2 times a day, 3-5 times a week for a duration of 6-12 weeks.  Proper technique shown and discussed handout in great detail with ATC.  All questions were discussed and answered.     Orders Placed This Encounter  Procedures  . Ambulatory referral to Physical Therapy    Referral Priority:   Routine    Referral Type:   Physical Medicine    Referral Reason:   Specialty Services Required    Requested Specialty:   Physical Therapy   Meds ordered this encounter  Medications  . cyclobenzaprine (FLEXERIL) 5 MG tablet    Sig: Take 1-2 tablets (  5-10 mg total) by mouth at bedtime as needed for muscle spasms.    Dispense:  30 tablet    Refill:  0     Discussed warning signs or symptoms. Please see discharge instructions. Patient expresses understanding.   The above documentation has been reviewed and is accurate and complete Melanie [name]

## 2019-10-18 NOTE — Patient Instructions (Addendum)
Thank you for coming in today. Try the flexeril at bedtime as needed for spasm.  It will make you sleepy.  Ok to take with tylenol.  Use heat and maybe TENS unit.   Please perform the exercise program that we have prepared for you and gone over in detail on a daily basis.  In addition to the handout you were provided you can access your program through: [email or web address]   Your unique program code is:  [email or web address]    Lumbosacral Strain Lumbosacral strain is an injury that causes pain in the lower back (lumbosacral spine). This injury usually happens from overstretching the muscles or ligaments along your spine. Ligaments are cord-like tissues that connect bones to other bones. A strain can affect one or more muscles or ligaments. What are the causes? This condition may be caused by:  A hard, direct hit to the back.  Overstretching the lower back muscles. This may result from: ? A fall. ? Lifting something heavy. ? Repetitive movements such as bending or crouching. What increases the risk? The following factors may make you more likely to develop this condition:  Participating in sports or activities that involve: ? A sudden twist of the back. ? Pushing or pulling motions.  Being overweight or obese.  Having poor strength and flexibility, especially tight hamstrings or weak muscles in the back or abdomen.  Having too much of a curve in the lower back.  Having a pelvis that is tilted forward. What are the signs or symptoms? The main symptom of this condition is pain in the lower back, at the site of the strain. Pain may also be felt down one or both legs. How is this diagnosed? This condition is diagnosed based on your symptoms, your medical history, and a physical exam. During the physical exam, your health care provider may push on certain areas of your back to find the source of your pain. You may be asked to bend forward, backward, and side to side to check your pain and  range of motion. You may also have imaging tests, such as X-rays and an MRI. How is this treated? This condition may be treated by:  Applying heat and cold on the affected area.  Taking medicines to help relieve pain and relax your muscles.  Taking NSAIDs, such as ibuprofen, to help reduce swelling and discomfort.  Doing stretching and strengthening exercises for your lower back. Symptoms usually improve within several weeks of treatment. However, recovery time varies. When your symptoms improve, gradually return to your normal routine as soon as possible to reduce pain, avoid stiffness, and keep muscle strength. Follow these instructions at home: Medicines  Take over-the-counter and prescription medicines only as told by your health care provider.  Ask your health care provider if the medicine prescribed to you: ? Requires you to avoid driving or using heavy machinery. ? Can cause constipation. You may need to take these actions to prevent or treat constipation:  Drink enough fluid to keep your urine pale yellow.  Take over-the-counter or prescription medicines.  Eat foods that are high in fiber, such as beans, whole grains, and fresh fruits and vegetables.  Limit foods that are high in fat and processed sugars, such as fried or sweet foods. Managing pain, stiffness, and swelling      If directed, put ice on the injured area. To do this: ? Put ice in a plastic bag. ? Place a towel between your skin and the bag. ? Leave the ice  on for 20 minutes, 2-3 times a day.  If directed, apply heat on the affected area as often as told by your health care provider. Use the heat source that your health care provider recommends, such as a moist heat pack or a heating pad. ? Place a towel between your skin and the heat source. ? Leave the heat on for 20-30 minutes. ? Remove the heat if your skin turns bright red. This is especially important if you are unable to feel pain, heat, or cold.  You may have a greater risk of getting burned. Activity  Rest as told by your health care provider.  Do not stay in bed. Staying in bed for more than 1-2 days can delay your recovery.  Return to your normal activities as told by your health care provider. Ask your health care provider what activities are safe for you.  Avoid activities that take a lot of energy for as long as told by your health care provider.  Do exercises as told by your health care provider. This includes stretching and strengthening exercises. General instructions  Sit up and stand up straight. Avoid leaning forward when you sit, or hunching over when you stand.  Do not use any products that contain nicotine or tobacco, such as cigarettes, e-cigarettes, and chewing tobacco. If you need help quitting, ask your health care provider.  Keep all follow-up visits as told by your health care provider. This is important. How is this prevented?   Use correct form when playing sports and lifting heavy objects.  Use good posture when sitting and standing.  Maintain a healthy weight.  Sleep on a mattress with medium firmness to support your back.  Do at least 150 minutes of moderate-intensity exercise each week, such as brisk walking or water aerobics. Try a form of exercise that takes stress off your back, such as swimming or stationary cycling.  Maintain physical fitness, including: ? Strength. ? Flexibility. Contact a health care provider if:  Your back pain does not improve after several weeks of treatment.  Your symptoms get worse. Get help right away if:  Your back pain is severe.  You cannot stand or walk.  You have difficulty controlling when you urinate or when you have a bowel movement.  You feel nauseous or you vomit.  Your feet or legs get very cold, turn pale, or look blue.  You have numbness, tingling, weakness, or problems using your arms or legs.  You develop any of the  following: ? Shortness of breath. ? Dizziness. ? Pain in your legs. ? Weakness in your buttocks or legs. Summary  Lumbosacral strain is an injury that causes pain in the lower back (lumbosacral spine).  This injury usually happens from overstretching the muscles or ligaments along your spine.  This condition may be caused by a direct hit to the lower back or by overstretching the lower back muscles.  Symptoms usually improve within several weeks of treatment. This information is not intended to replace advice given to you by your health care provider. Make sure you discuss any questions you have with your health care provider. Document Revised: 10/19/2018 Document Reviewed: 10/19/2018 Elsevier Patient Education  [place].

## 2019-10-26 ENCOUNTER — Ambulatory Visit: Payer: 59 | Attending: Family Medicine

## 2019-10-26 ENCOUNTER — Other Ambulatory Visit: Payer: Self-pay

## 2019-10-26 ENCOUNTER — Encounter: Payer: Self-pay | Admitting: Family Medicine

## 2019-10-26 DIAGNOSIS — R2689 Other abnormalities of gait and mobility: Secondary | ICD-10-CM | POA: Diagnosis not present

## 2019-10-26 DIAGNOSIS — M5386 Other specified dorsopathies, lumbar region: Secondary | ICD-10-CM | POA: Insufficient documentation

## 2019-10-26 DIAGNOSIS — M545 Low back pain, unspecified: Secondary | ICD-10-CM

## 2019-10-28 ENCOUNTER — Other Ambulatory Visit: Payer: Self-pay

## 2019-10-28 ENCOUNTER — Ambulatory Visit: Payer: 59 | Admitting: Physical Therapy

## 2019-10-28 ENCOUNTER — Encounter: Payer: Self-pay | Admitting: Physical Therapy

## 2019-10-28 DIAGNOSIS — R2689 Other abnormalities of gait and mobility: Secondary | ICD-10-CM | POA: Diagnosis not present

## 2019-10-28 DIAGNOSIS — M545 Low back pain, unspecified: Secondary | ICD-10-CM

## 2019-10-28 DIAGNOSIS — M5386 Other specified dorsopathies, lumbar region: Secondary | ICD-10-CM

## 2019-10-28 MED ORDER — HYDROCODONE-ACETAMINOPHEN 5-325 MG PO TABS: 1.0000 | ORAL_TABLET | Freq: Four times a day (QID) | ORAL | 0 refills | Status: DC | PRN

## 2019-10-28 MED FILL — HYDROCODON-APAP 5-325: 5-325 | 4 days supply | Qty: 15 | Fill #0

## 2019-10-28 NOTE — Therapy (Signed)
[facility] [address] [place], [place], [address] Phone: [phone]   Fax:  [phone]  Physical Therapy Evaluation  Patient Details  Name: Melanie [name] MRN: [id number] Date of Birth: [birth year] Referring Provider (PT): Rodolph Bong, MD   Encounter Date: 10/26/2019  PT End of Session - 10/28/19 0550    Visit Number  1    Number of Visits  13    Date for PT Re-Evaluation  01/27/20    Authorization Type  [employer]    Progress Note Due on Visit  10    PT Start Time  1700    PT Stop Time  1800    PT Time Calculation (min)  60 min    Activity Tolerance  Patient tolerated treatment well    Behavior During Therapy  [facility] for tasks assessed/performed       Past Medical History:  Diagnosis Date  . Allergy   . Asthma   . Osteoporosis     Past Surgical History:  Procedure Laterality Date  . ABDOMINAL HYSTERECTOMY    . APPENDECTOMY    . KNEE SURGERY      There were no vitals filed for this visit.   Subjective Assessment - 10/28/19 0528    Subjective  Pt reports her low back started hurting approx 2 weeks ago after assisting her sister who had recent surgery. During this visit, she states she completed repeated lifting. Following starting exs provided by Dr. Denyse Amass, her low back is feeling some better, rating it a 7/10. Pt has a Hx of a previous back and L knee injury with the L knee requiring surgery.    Limitations  Standing;Lifting;Sitting;Walking    How long can you sit comfortably?  1 hr    How long can you stand comfortably?  30 mins    How long can you walk comfortably?  15 mins    Patient Stated Goals  Less pain, more mobility, back to my regular activiites    Currently in Pain?  Yes    Pain Score  7     Pain Location  Back    Pain Orientation  Posterior   Midline to L   Pain Descriptors / Indicators  Throbbing;Aching;Sharp    Pain Type  Acute pain    Pain Radiating Towards  NA    Pain Onset  1 to 4  weeks ago    Aggravating Factors   Certain sudden movements, esp. turning a night    Pain Relieving Factors  TENS, heat, cold    Effect of Pain on Daily Activities  Mod to marked limitation         [facility] PT Assessment - 10/28/19 0001      Assessment   Medical Diagnosis  Lumbosacral strain    Referring Provider (PT)  Rodolph Bong, MD    Onset Date/Surgical Date  --   Aprrox 2 weeks ago   Hand Dominance  Left    Prior Therapy  yes      Precautions   Precautions  None      Restrictions   Weight Bearing Restrictions  No      Balance Screen   Has the patient fallen in the past 6 months  No    Has the patient had a decrease in activity level because of a fear of falling?   No    Is the patient reluctant to leave their home because of a fear of falling?  No      Home Environment   Living Environment  Private residence    Living Arrangements  Alone    Available Help at Discharge  Friend(s)    Type of [place] to enter    Entrance Stairs-Number of Steps  2    Entrance Stairs-Rails  Right    Home Layout  One level      Prior Function   Level of Independence  Independent    Vocation  Full time employment    [place] a laptop and phone from home. Is able to change position.      Cognition   Overall Cognitive Status  Within Functional Limits for tasks assessed      Observation/Other Assessments   Focus on Therapeutic Outcomes (FOTO)   50% limitation      Sensation   Light Touch  Appears Intact      Coordination   Gross Motor Movements are Fluid and Coordinated  Yes      Posture/Postural Control   Posture/Postural Control  Postural limitations    Postural Limitations  Forward head      ROM / Strength   AROM / PROM / Strength  AROM      AROM   AROM Assessment Site  Lumbar    Lumbar Flexion  Full    Lumbar Extension  50% limitation   pain provoked   Lumbar - Right Side Bend  35    Lumbar - Left  Side Bend  28   min provacation of pain   Lumbar - Right Rotation  Full    Lumbar - Left Rotation  25% limitation   min provacation of pain     Palpation   Palpation comment  TTP low back L3-L5 midline to L       Special Tests    Special Tests  Lumbar;Sacrolliac Tests    Lumbar Tests  Straight Leg Raise    Sacroiliac Tests   Pelvic Compression      Straight Leg Raise   Findings  Negative    Side   Left   Rt     Pelvic Dictraction   Findings  Negative      Pelvic Compression   Findings  Negative      Transfers   Transfers  Sit to Stand    Sit to Stand  7: Independent      Ambulation/Gait   Gait Pattern  Within Functional Limits                  Objective measurements completed on examination: See above findings.              PT Education - 10/28/19 0550    Education Details  Eval finding, POC, HEP    Person(s) Educated  Patient    Methods  Demonstration;Tactile cues;Verbal cues;Handout;Explanation    Comprehension  Verbalized understanding;Returned demonstration;Verbal cues required;Tactile cues required;Need further instruction       PT Short Term Goals - 10/28/19 0612      PT SHORT TERM GOAL #1   Title  Pt will be ind c an initial HEP    Baseline  In progress    Time  3    Period  Weeks    Status  New    Target Date  11/18/19      PT SHORT TERM GOAL #2   Title  Pt will  report a pain range of 0-5 with daily activities    Baseline  4-7/10    Time  3    Period  Weeks    Status  New    Target Date  11/18/19        PT Long Term Goals - 10/28/19 0614      PT LONG TERM GOAL #1   Title  Back motion of L SB and ext will demonstrate improved ROM with L SB to 35d and Ext full ROM    Baseline  L SB 28d, ext 50% limited    Time  7    Period  Weeks    Status  New    Target Date  12/16/19      PT LONG TERM GOAL #2   Title  Pt will report an improved low back pain range of 0-3 with sleeping and daily activities    Baseline  4-7/10     Time  7    Period  Weeks    Status  New    Target Date  12/16/19      PT LONG TERM GOAL #3   Title  Pt will demonstrate understanding of proper posture and body mechanics for the reduction of pain and prevention of future injuries.    Time  7    Period  Weeks    Status  New    Target Date  12/16/19      PT LONG TERM GOAL #4   Title  Pt will be Ind in a final HEP    Baseline  In progress    Time  7    Period  Weeks    Status  New    Target Date  12/16/19      PT LONG TERM GOAL #5   Title  Pt's FOTO score will improve to a 29% limitation    Baseline  Initial score 50% limitation    Time  7    Period  Weeks    Status  New    Target Date  12/16/19             Plan - 10/28/19 0552    Clinical Impression Statement  Pt presents with recent low back injury. SI assessment, SLRs, myotomal screen are negative. L SB and extension are the most limited and painful back movements. Assessment for directional back movement preference found pain was reduced c prone lying c a R lateral hip shift which decreased to 4/10. Pt was not able to progress to prone elbows. Pt was provided a sequence of prone exercise to attempt to progress through as tolerated. Will assess pt's response on next visit. Pt will benefit from PT to adddress pain, ROM, and strength deficits to improve pt's functional mobility.    Personal Factors and Comorbidities  Past/Current Experience    Examination-Activity Limitations  Carry;Sit;Sleep;Locomotion Level;Transfers;Squat;Stairs;Stand    Examination-Participation Restrictions  Cleaning;Yard Work    Conservation officer, historic buildings  Evolving/Moderate complexity    Clinical Decision Making  Moderate    Rehab Potential  Good    PT Frequency  2x / week    PT Duration  6 weeks    PT Treatment/Interventions  Cryotherapy;Electrical Stimulation;Ultrasound;Traction;Moist Heat;Iontophoresis 4mg /ml Dexamethasone;Gait training;Stair training;Functional mobility  training;Therapeutic activities;Therapeutic exercise;Neuromuscular re-education;Manual techniques;Patient/family education;Passive range of motion;Dry needling;Spinal Manipulations;Taping    PT Next Visit Plan  Assess response to prone exercises    PT Home Exercise Plan  [id number]. Prone exercise. See instructions.  Patient will benefit from skilled therapeutic intervention in order to improve the following deficits and impairments:  Decreased activity tolerance, Decreased mobility, Decreased strength, Improper body mechanics, Impaired flexibility, Decreased knowledge of precautions, Pain, Decreased range of motion, Difficulty walking  Visit Diagnosis: Acute left-sided low back pain without sciatica - Plan: PT plan of care cert/re-cert  Other abnormalities of gait and mobility - Plan: PT plan of care cert/re-cert  Decreased ROM of lumbar spine - Plan: PT plan of care cert/re-cert     Problem List Patient Active Problem List   Diagnosis Date Noted  . Acute sinus infection 07/12/2018  . Viral illness 07/08/2018  . Mild intermittent asthma without complication 07/08/2018  . Nonallopathic lesion of rib cage 01/28/2018  . Nonallopathic lesion of sacral region 12/24/2017  . ETD (Eustachian tube dysfunction), left 07/10/2017  . Greater trochanteric bursitis, right 06/25/2017  . Migraine headache 02/17/2017  . Intercostal pain 02/03/2017  . Polyarthralgia 08/23/2015  . Asthma with acute exacerbation 02/28/2015  . Quadriceps tendinitis 11/16/2014  . Neck pain 07/07/2014  . Concussion with no loss of consciousness 07/07/2014  . Nonallopathic lesion of lumbosacral region 11/04/2013  . Nonallopathic lesion of thoracic region 11/04/2013  . Piriformis syndrome of left side 10/14/2013  . Greater trochanteric bursitis of left hip 10/14/2013  . Trapezius muscle spasm 11/26/2012  . Nonallopathic lesion of cervicothoracic region 11/26/2012  . Seasonal allergies 09/28/2012  . Asthma attack  12/01/2011  . Backache 09/26/2009  . OTHER ACQUIRED DEFORMITY OF ANKLE AND FOOT OTHER 09/26/2009  . ABNORMALITY OF GAIT 09/26/2009  . KNEE PAIN, RIGHT 08/08/2009  . TENDINITIS, PATELLAR 08/08/2009    Joellyn Rued MS, PT 10/28/19 6:39 AM  [facility] [address] [place], [place], [address] Phone: [phone]   Fax:  [phone]  Name: LAQUINTA HAZELL MRN: [id number] Date of Birth: Feb 13, 1961

## 2019-10-28 NOTE — Patient Instructions (Signed)
  Pt is to try an progress from prone c R lateral hip shift, to prone on a pillow, to prone on elbows, to prone press ups as tolerated 4x daily

## 2019-10-28 NOTE — Therapy (Signed)
[facility] [address] [place], [place], [address] Phone: [phone]   Fax:  [phone]  Physical Therapy Treatment  Patient Details  Name: Melanie [name] MRN: [id number] Date of Birth: 1961-05-18 Referring Provider (PT): Rodolph Bong, MD   Encounter Date: 10/28/2019  PT End of Session - 10/28/19 0950    Visit Number  2    Number of Visits  13    Date for PT Re-Evaluation  01/27/20    Authorization Type  [employer]    Progress Note Due on Visit  10    PT Start Time  [phone]    PT Stop Time  1035    PT Time Calculation (min)  42 min    Activity Tolerance  Patient tolerated treatment well    Behavior During Therapy  [facility] for tasks assessed/performed       Past Medical History:  Diagnosis Date  . Allergy   . Asthma   . Osteoporosis     Past Surgical History:  Procedure Laterality Date  . ABDOMINAL HYSTERECTOMY    . APPENDECTOMY    . KNEE SURGERY      There were no vitals filed for this visit.  Subjective Assessment - 10/28/19 0953    Subjective  Patient reports the extension based exercises are getting easier. States that her back is feeling a little better today.    Patient Stated Goals  Less pain, more mobility, back to my regular activiites    Currently in Pain?  Yes    Pain Score  5     Pain Location  Back    Pain Orientation  Lower    Pain Descriptors / Indicators  Throbbing    Pain Type  Acute pain    Pain Onset  1 to 4 weeks ago    Pain Frequency  Constant         [facility] PT Assessment - 10/28/19 1037      AROM   Lumbar Flexion  WFL    Lumbar Extension  75%    Lumbar - Right Side Bend  Fingertips to joint line    Lumbar - Left Side Bend  Fingertips to joint line                    [facility] Adult PT Treatment/Exercise - 10/28/19 1000      Self-Care   Self-Care  Other Self-Care Comments    Other Self-Care Comments   Riding stationary bike starting in recumbent position and  gradually progressing to upright and with time, returning to pool starting with walking and light movement progressing speed and range of motion of movement      Exercises   Exercises  Lumbar      Lumbar Exercises: Stretches   Press Ups  15 reps    Press Ups Limitations  patial range      Lumbar Exercises: Aerobic   Nustep  L5 x 6 min (UE and LE)      Lumbar Exercises: Supine   Bridge  10 reps;2 seconds    Bridge Limitations  legs elevated on swiss ball in 90-90 position, cued for posterior pelvic tilt    Other Supine Lumbar Exercises  90-90 alternating toe taps x10      Lumbar Exercises: Quadruped   Madcat/Old Horse  10 reps    Madcat/Old Horse Limitations  cued for lumbar and pelvic control    Opposite Arm/Leg Raise  10 reps;2 seconds  Opposite Arm/Leg Raise Limitations  cued to avoid shifting, maintaining neutral lumbar spine             PT Education - 10/28/19 0950    Education Details  HEP, see self care    Person(s) Educated  Patient    Methods  Explanation;Demonstration;Tactile cues;Verbal cues;Handout    Comprehension  Verbalized understanding;Returned demonstration;Verbal cues required;Tactile cues required;Need further instruction       PT Short Term Goals - 10/28/19 0612      PT SHORT TERM GOAL #1   Title  Pt will be ind c an initial HEP    Baseline  In progress    Time  3    Period  Weeks    Status  New    Target Date  11/18/19      PT SHORT TERM GOAL #2   Title  Pt will report a pain range of 0-5 with daily activities    Baseline  4-7/10    Time  3    Period  Weeks    Status  New    Target Date  11/18/19        PT Long Term Goals - 10/28/19 0614      PT LONG TERM GOAL #1   Title  Back motion of L SB and ext will demonstrate improved ROM with L SB to 35d and Ext full ROM    Baseline  L SB 28d, ext 50% limited    Time  7    Period  Weeks    Status  New    Target Date  12/16/19      PT LONG TERM GOAL #2   Title  Pt will report an  improved low back pain range of 0-3 with sleeping and daily activities    Baseline  4-7/10    Time  7    Period  Weeks    Status  New    Target Date  12/16/19      PT LONG TERM GOAL #3   Title  Pt will demonstrate understanding of proper posture and body mechanics for the reduction of pain and prevention of future injuries.    Time  7    Period  Weeks    Status  New    Target Date  12/16/19      PT LONG TERM GOAL #4   Title  Pt will be Ind in a final HEP    Baseline  In progress    Time  7    Period  Weeks    Status  New    Target Date  12/16/19      PT LONG TERM GOAL #5   Title  Pt's FOTO score will improve to a 29% limitation    Baseline  Initial score 50% limitation    Time  7    Period  Weeks    Status  New    Target Date  12/16/19            Plan - 10/28/19 0951    Clinical Impression Statement  Patient tolerated therapy well with no advers effects. She exhibits improvement in lumbar motion with reduced pain during extension and left side bend. She tolerated progression with core strengthen well with only minimal low back discomfort. She was educated on progression back to previous activies such as riding stationary bike and pool. Patient would benefit from continued skilled PT to improve back pain and core strength in order to progress  back to previous activity level and exercise.    PT Treatment/Interventions  Cryotherapy;Electrical Stimulation;Ultrasound;Traction;Moist Heat;Iontophoresis 4mg /ml Dexamethasone;Gait training;Stair training;Functional mobility training;Therapeutic activities;Therapeutic exercise;Neuromuscular re-education;Manual techniques;Patient/family education;Passive range of motion;Dry needling;Spinal Manipulations;Taping    PT Next Visit Plan  Assess response to HEP, progress core strength as tolerated    PT Home Exercise Plan  [id number]. Prone press up, bridge with feet on ball, 90-90 toe taps, cat camel, bird dog    Consulted and Agree with Plan  of Care  Patient       Patient will benefit from skilled therapeutic intervention in order to improve the following deficits and impairments:  Decreased activity tolerance, Decreased mobility, Decreased strength, Improper body mechanics, Impaired flexibility, Decreased knowledge of precautions, Pain, Decreased range of motion, Difficulty walking  Visit Diagnosis: Acute left-sided low back pain without sciatica  Other abnormalities of gait and mobility  Decreased ROM of lumbar spine     Problem List Patient Active Problem List   Diagnosis Date Noted  . Acute sinus infection 07/12/2018  . Viral illness 07/08/2018  . Mild intermittent asthma without complication 07/08/2018  . Nonallopathic lesion of rib cage 01/28/2018  . Nonallopathic lesion of sacral region 12/24/2017  . ETD (Eustachian tube dysfunction), left 07/10/2017  . Greater trochanteric bursitis, right 06/25/2017  . Migraine headache 02/17/2017  . Intercostal pain 02/03/2017  . Polyarthralgia 08/23/2015  . Asthma with acute exacerbation 02/28/2015  . Quadriceps tendinitis 11/16/2014  . Neck pain 07/07/2014  . Concussion with no loss of consciousness 07/07/2014  . Nonallopathic lesion of lumbosacral region 11/04/2013  . Nonallopathic lesion of thoracic region 11/04/2013  . Piriformis syndrome of left side 10/14/2013  . Greater trochanteric bursitis of left hip 10/14/2013  . Trapezius muscle spasm 11/26/2012  . Nonallopathic lesion of cervicothoracic region 11/26/2012  . Seasonal allergies 09/28/2012  . Asthma attack 12/01/2011  . Backache 09/26/2009  . OTHER ACQUIRED DEFORMITY OF ANKLE AND FOOT OTHER 09/26/2009  . ABNORMALITY OF GAIT 09/26/2009  . KNEE PAIN, RIGHT 08/08/2009  . TENDINITIS, PATELLAR 08/08/2009    10/08/2009, PT, DPT, LAT, ATC 10/28/19  11:35 AM Phone: [phone] Fax: [phone]   [facility] [address] [place], [place],  [place] Phone: [phone]   Fax:  [phone]  Name: MYLANI GENTRY MRN: [name] Date of Birth: 1961/05/05

## 2019-11-09 ENCOUNTER — Other Ambulatory Visit: Payer: Self-pay

## 2019-11-09 ENCOUNTER — Ambulatory Visit: Payer: 59 | Attending: Family Medicine

## 2019-11-09 DIAGNOSIS — M5386 Other specified dorsopathies, lumbar region: Secondary | ICD-10-CM | POA: Diagnosis not present

## 2019-11-09 DIAGNOSIS — R2689 Other abnormalities of gait and mobility: Secondary | ICD-10-CM | POA: Diagnosis not present

## 2019-11-09 DIAGNOSIS — M545 Low back pain, unspecified: Secondary | ICD-10-CM

## 2019-11-09 NOTE — Therapy (Signed)
[facility] [address] [place], [place], [address] Phone: [phone]   Fax:  [phone]  Physical Therapy Treatment  Patient Details  Name: Melanie [name] MRN: [id number] Date of Birth: Oct 23, 1960 Referring Provider (PT): Rodolph Bong, MD   Encounter Date: 11/09/2019  PT End of Session - 11/09/19 2154    Visit Number  3    Number of Visits  13    Date for PT Re-Evaluation  01/27/20    Authorization Type  [place] [employer]    Progress Note Due on Visit  10    PT Start Time  1703    PT Stop Time  1749    PT Time Calculation (min)  46 min    Activity Tolerance  Patient tolerated treatment well    Behavior During Therapy  [facility] for tasks assessed/performed       Past Medical History:  Diagnosis Date  . Allergy   . Asthma   . Osteoporosis     Past Surgical History:  Procedure Laterality Date  . ABDOMINAL HYSTERECTOMY    . APPENDECTOMY    . KNEE SURGERY      There were no vitals filed for this visit.  Subjective Assessment - 11/09/19 1731    Subjective  Pt reports her back exs are geting easier and her low back pain is improving.    Currently in Pain?  Yes    Pain Score  4     Pain Location  Back    Pain Orientation  Posterior;Left   to midline   Pain Descriptors / Indicators  Throbbing    Pain Type  Acute pain    Pain Radiating Towards  NA    Pain Onset  1 to 4 weeks ago    Pain Frequency  Constant                        [facility] Adult PT Treatment/Exercise - 11/09/19 0001      Lumbar Exercises: Stretches   Lower Trunk Rotation  5 reps;10 seconds    Lower Trunk Rotation Limitations  each direction    Standing Extension  10 reps    Standing Extension Limitations  3 sec    Prone on Elbows Stretch  --   prone on stomach 2 mins; f/b prove on elbows 2 mins   Press Ups  10 reps    Press Ups Limitations  partial range, but improved      Lumbar Exercises: Supine   Bridge  10 reps;2 seconds     Bridge Limitations  legs elevated on swiss ball in 90-90 position, cued for posterior pelvic tilt    Other Supine Lumbar Exercises  90-90 alternating toe taps; 8x3      Lumbar Exercises: Quadruped   [place]  10 reps    Straight Leg Raise  10 reps;2 seconds    Straight Leg Raises Limitations  2 sets; ques for abs. engagement             PT Education - 11/09/19 2153    Education Details  HEP c trunk rotation added    Person(s) Educated  Patient    Methods  Explanation;Demonstration;Tactile cues;Verbal cues;Handout    Comprehension  Verbalized understanding;Returned demonstration;Verbal cues required;Tactile cues required;Need further instruction       PT Short Term Goals - 10/28/19 0612      PT SHORT TERM GOAL #1   Title  Pt will be  ind c an initial HEP    Baseline  In progress    Time  3    Period  Weeks    Status  New    Target Date  11/18/19      PT SHORT TERM GOAL #2   Title  Pt will report a pain range of 0-5 with daily activities    Baseline  4-7/10    Time  3    Period  Weeks    Status  New    Target Date  11/18/19        PT Long Term Goals - 10/28/19 0614      PT LONG TERM GOAL #1   Title  Back motion of L SB and ext will demonstrate improved ROM with L SB to 35d and Ext full ROM    Baseline  L SB 28d, ext 50% limited    Time  7    Period  Weeks    Status  New    Target Date  12/16/19      PT LONG TERM GOAL #2   Title  Pt will report an improved low back pain range of 0-3 with sleeping and daily activities    Baseline  4-7/10    Time  7    Period  Weeks    Status  New    Target Date  12/16/19      PT LONG TERM GOAL #3   Title  Pt will demonstrate understanding of proper posture and body mechanics for the reduction of pain and prevention of future injuries.    Time  7    Period  Weeks    Status  New    Target Date  12/16/19      PT LONG TERM GOAL #4   Title  Pt will be Ind in a final HEP    Baseline  In progress    Time  7     Period  Weeks    Status  New    Target Date  12/16/19      PT LONG TERM GOAL #5   Title  Pt's FOTO score will improve to a 29% limitation    Baseline  Initial score 50% limitation    Time  7    Period  Weeks    Status  New    Target Date  12/16/19            Plan - 11/09/19 2156    Clinical Impression Statement  Pt's back pain and mobility continue to make appropriate progress. Lumbar extension ROM has increased, although is still limited by a sensation of pressure at end range. Pt reports consistent completion of her HEP.    Personal Factors and Comorbidities  Past/Current Experience    Examination-Activity Limitations  Carry;Sit;Sleep;Locomotion Level;Transfers;Squat;Stairs;Stand    Examination-Participation Restrictions  Cleaning;Yard Work    Stability/Clinical Decision Making  Evolving/Moderate complexity    Rehab Potential  Good    PT Frequency  2x / week    PT Duration  6 weeks    PT Treatment/Interventions  Cryotherapy;Electrical Stimulation;Ultrasound;Traction;Moist Heat;Iontophoresis 4mg /ml Dexamethasone;Gait training;Stair training;Functional mobility training;Therapeutic activities;Therapeutic exercise;Neuromuscular re-education;Manual techniques;Patient/family education;Passive range of motion;Dry needling;Spinal Manipulations;Taping    PT Next Visit Plan  Assess response to days session. Progress core strengthening as indicated.    PT [place]. Supine trunk rotation added.       Patient will benefit from skilled therapeutic intervention in order to improve the following deficits  and impairments:  Decreased activity tolerance, Decreased mobility, Decreased strength, Improper body mechanics, Impaired flexibility, Decreased knowledge of precautions, Pain, Decreased range of motion, Difficulty walking  Visit Diagnosis: Acute left-sided low back pain without sciatica  Other abnormalities of gait and mobility  Decreased ROM of lumbar  spine     Problem List Patient Active Problem List   Diagnosis Date Noted  . Acute sinus infection 07/12/2018  . Viral illness 07/08/2018  . Mild intermittent asthma without complication 07/08/2018  . Nonallopathic lesion of rib cage 01/28/2018  . Nonallopathic lesion of sacral region 12/24/2017  . ETD (Eustachian tube dysfunction), left 07/10/2017  . Greater trochanteric bursitis, right 06/25/2017  . Migraine headache 02/17/2017  . Intercostal pain 02/03/2017  . Polyarthralgia 08/23/2015  . Asthma with acute exacerbation 02/28/2015  . Quadriceps tendinitis 11/16/2014  . Neck pain 07/07/2014  . Concussion with no loss of consciousness 07/07/2014  . Nonallopathic lesion of lumbosacral region 11/04/2013  . Nonallopathic lesion of thoracic region 11/04/2013  . Piriformis syndrome of left side 10/14/2013  . Greater trochanteric bursitis of left hip 10/14/2013  . Trapezius muscle spasm 11/26/2012  . Nonallopathic lesion of cervicothoracic region 11/26/2012  . Seasonal allergies 09/28/2012  . Asthma attack 12/01/2011  . Backache 09/26/2009  . OTHER ACQUIRED DEFORMITY OF ANKLE AND FOOT OTHER 09/26/2009  . ABNORMALITY OF GAIT 09/26/2009  . KNEE PAIN, RIGHT 08/08/2009  . TENDINITIS, PATELLAR 08/08/2009    Joellyn Rued MS, PT 11/09/19 10:16 PM   [facility] [address] [place], [place], [address] Phone: [phone]   Fax:  [phone]  Name: Melanie [name] MRN: [id number] Date of Birth: [birth year]

## 2019-11-10 ENCOUNTER — Ambulatory Visit: Payer: 59

## 2019-11-12 DIAGNOSIS — H524 Presbyopia: Secondary | ICD-10-CM | POA: Diagnosis not present

## 2019-11-15 ENCOUNTER — Ambulatory Visit: Payer: 59

## 2019-11-15 ENCOUNTER — Other Ambulatory Visit: Payer: Self-pay

## 2019-11-15 DIAGNOSIS — R2689 Other abnormalities of gait and mobility: Secondary | ICD-10-CM

## 2019-11-15 DIAGNOSIS — M545 Low back pain, unspecified: Secondary | ICD-10-CM

## 2019-11-15 DIAGNOSIS — M5386 Other specified dorsopathies, lumbar region: Secondary | ICD-10-CM | POA: Diagnosis not present

## 2019-11-16 NOTE — Therapy (Signed)
[facility] [address] [place], [place], [address] Phone: [phone]   Fax:  [phone]  Physical Therapy Treatment  Patient Details  Name: Melanie [name] MRN: [id number] Date of Birth: 1961/05/28 Referring Provider (PT): Rodolph Bong, MD   Encounter Date: 11/15/2019  PT End of Session - 11/16/19 0177    Visit Number  4    Number of Visits  13    Date for PT Re-Evaluation  01/27/20    Authorization Type  [place] [employer]    Progress Note Due on Visit  10    PT Start Time  1704    PT Stop Time  1755    PT Time Calculation (min)  51 min    Activity Tolerance  Patient tolerated treatment well    Behavior During Therapy  [facility] for tasks assessed/performed       Past Medical History:  Diagnosis Date  . Allergy   . Asthma   . Osteoporosis     Past Surgical History:  Procedure Laterality Date  . ABDOMINAL HYSTERECTOMY    . APPENDECTOMY    . KNEE SURGERY      There were no vitals filed for this visit.  Subjective Assessment - 11/16/19 0629    Subjective  Pt reports her low back pain is gradully improving she a rates a 4/10 today. Pt reports consistent completion of her HEP and returning to being more active. Pt states she is able to walk 2 miles, then her bakc will start to bother her. Normally she is able to walk 4 -5 miles.    Currently in Pain?  Yes    Pain Score  4     Pain Location  Back    Pain Orientation  Posterior;Lower;Left    Pain Type  Acute pain    Pain Onset  More than a month ago                        [facility] Adult PT Treatment/Exercise - 11/16/19 0001      Lumbar Exercises: Stretches   Lower Trunk Rotation  5 reps;10 seconds    Lower Trunk Rotation Limitations  each direction    Press Ups  10 reps    Press Ups Limitations  3 sets, 2 sets assisted pressure to stabize pelvis       Lumbar Exercises: Supine   Bridge  10 reps;2 seconds    Bridge Limitations  with engaged core    Other Supine Lumbar Exercises  90-90 alternating toe taps; 8x2      Lumbar Exercises: Quadruped   Straight Leg Raise  10 reps;2 seconds    Straight Leg Raises Limitations  ques for abs. engagement    Opposite Arm/Leg Raise  10 reps;20 reps    Opposite Arm/Leg Raise Limitations  uqes for ab engagement    Other Quadruped Lumbar Exercises  arm lifts; 2 sec    Other Quadruped Lumbar Exercises  ques for abs engagement      Modalities   Modalities  Cryotherapy      Cryotherapy   Number Minutes Cryotherapy  10 Minutes    Cryotherapy Location  Lumbar Spine    Type of Cryotherapy  Ice pack             PT Education - 11/16/19 9390    Education Details  With prone extensions to maintain hip on the surface as much as possible    Person(s)  Educated  Patient    Methods  Explanation    Comprehension  Verbalized understanding;Returned demonstration       PT Short Term Goals - 10/28/19 0612      PT SHORT TERM GOAL #1   Title  Pt will be ind c an initial HEP    Baseline  In progress    Time  3    Period  Weeks    Status  New    Target Date  11/18/19      PT SHORT TERM GOAL #2   Title  Pt will report a pain range of 0-5 with daily activities    Baseline  4-7/10    Time  3    Period  Weeks    Status  New    Target Date  11/18/19        PT Long Term Goals - 10/28/19 0614      PT LONG TERM GOAL #1   Title  Back motion of L SB and ext will demonstrate improved ROM with L SB to 35d and Ext full ROM    Baseline  L SB 28d, ext 50% limited    Time  7    Period  Weeks    Status  New    Target Date  12/16/19      PT LONG TERM GOAL #2   Title  Pt will report an improved low back pain range of 0-3 with sleeping and daily activities    Baseline  4-7/10    Time  7    Period  Weeks    Status  New    Target Date  12/16/19      PT LONG TERM GOAL #3   Title  Pt will demonstrate understanding of proper posture and body mechanics for the reduction of pain and prevention of future  injuries.    Time  7    Period  Weeks    Status  New    Target Date  12/16/19      PT LONG TERM GOAL #4   Title  Pt will be Ind in a final HEP    Baseline  In progress    Time  7    Period  Weeks    Status  New    Target Date  12/16/19      PT LONG TERM GOAL #5   Title  Pt's FOTO score will improve to a 29% limitation    Baseline  Initial score 50% limitation    Time  7    Period  Weeks    Status  New    Target Date  12/16/19            Plan - 11/16/19 0634    Clinical Impression Statement  Pt reports improve extension ROM and ease of completion with pelvis stabilization during prone press ups. Pt reports the cold pack in prone was hekpful in decreasing her LBP.    Personal Factors and Comorbidities  Past/Current Experience    Examination-Activity Limitations  Carry;Sit;Sleep;Locomotion Level;Transfers;Squat;Stairs;Stand    Examination-Participation Restrictions  Cleaning;Yard Work    [employer]  Evolving/Moderate complexity    Clinical Decision Making  Moderate    Rehab Potential  Good    PT Frequency  2x / week    PT Duration  6 weeks    PT Treatment/Interventions  Cryotherapy;Electrical Stimulation;Ultrasound;Traction;Moist Heat;Iontophoresis 4mg /ml Dexamethasone;Gait training;Stair training;Functional mobility training;Therapeutic activities;Therapeutic exercise;Neuromuscular re-education;Manual techniques;Patient/family education;Passive range of motion;Dry needling;Spinal Manipulations;Taping  PT Next Visit Plan  Assess response to treament program. Continue pelvis stabilization c prone press ups as indicated.    PT Home Exercise Plan  [facility]       Patient will benefit from skilled therapeutic intervention in order to improve the following deficits and impairments:  Decreased activity tolerance, Decreased mobility, Decreased strength, Improper body mechanics, Impaired flexibility, Decreased knowledge of precautions, Pain, Decreased range  of motion, Difficulty walking  Visit Diagnosis: Acute left-sided low back pain without sciatica  Other abnormalities of gait and mobility     Problem List Patient Active Problem List   Diagnosis Date Noted  . Acute sinus infection 07/12/2018  . Viral illness 07/08/2018  . Mild intermittent asthma without complication 07/08/2018  . Nonallopathic lesion of rib cage 01/28/2018  . Nonallopathic lesion of sacral region 12/24/2017  . ETD (Eustachian tube dysfunction), left 07/10/2017  . Greater trochanteric bursitis, right 06/25/2017  . Migraine headache 02/17/2017  . Intercostal pain 02/03/2017  . Polyarthralgia 08/23/2015  . Asthma with acute exacerbation 02/28/2015  . Quadriceps tendinitis 11/16/2014  . Neck pain 07/07/2014  . Concussion with no loss of consciousness 07/07/2014  . Nonallopathic lesion of lumbosacral region 11/04/2013  . Nonallopathic lesion of thoracic region 11/04/2013  . Piriformis syndrome of left side 10/14/2013  . Greater trochanteric bursitis of left hip 10/14/2013  . Trapezius muscle spasm 11/26/2012  . Nonallopathic lesion of cervicothoracic region 11/26/2012  . Seasonal allergies 09/28/2012  . Asthma attack 12/01/2011  . Backache 09/26/2009  . OTHER ACQUIRED DEFORMITY OF ANKLE AND FOOT OTHER 09/26/2009  . ABNORMALITY OF GAIT 09/26/2009  . KNEE PAIN, RIGHT 08/08/2009  . TENDINITIS, PATELLAR 08/08/2009    Joellyn Rued MS, PT 11/16/19 6:45 AM  [facility] [address] [place], [place], [address] Phone: [phone]   Fax:  [phone]  Name: Melanie [name] MRN: [id number] Date of Birth: 02-06-1961

## 2019-11-17 ENCOUNTER — Ambulatory Visit: Payer: 59

## 2019-11-17 ENCOUNTER — Other Ambulatory Visit: Payer: Self-pay

## 2019-11-17 DIAGNOSIS — M5386 Other specified dorsopathies, lumbar region: Secondary | ICD-10-CM

## 2019-11-17 DIAGNOSIS — R2689 Other abnormalities of gait and mobility: Secondary | ICD-10-CM | POA: Diagnosis not present

## 2019-11-17 DIAGNOSIS — M545 Low back pain, unspecified: Secondary | ICD-10-CM

## 2019-11-18 ENCOUNTER — Encounter: Payer: Self-pay | Admitting: Family Medicine

## 2019-11-18 ENCOUNTER — Ambulatory Visit: Payer: 59 | Admitting: Family Medicine

## 2019-11-18 DIAGNOSIS — M545 Low back pain, unspecified: Secondary | ICD-10-CM | POA: Insufficient documentation

## 2019-11-18 DIAGNOSIS — M999 Biomechanical lesion, unspecified: Secondary | ICD-10-CM

## 2019-11-18 NOTE — Progress Notes (Signed)
[facility] [address] [place] Phone: [phone] Subjective:    I'm seeing this patient by the request  of:  Binnie Rail, MD  CC: back pain   I, Judy Pimple, am serving as a scribe for Dr. Hulan Saas.  [id number]  Melanie [name] is a 59 y.o. female coming in with complaint of back and neck pain. OMT 07/22/2018.  Patient states she is in PT now.  Patient was seen by another provider and diagnosed with more of a lumbosacral strain.  Has started physical therapy and is making some progress she states.  Still has some mild tightness but no radicular symptoms.  Denies any weakness.         Reviewed prior external information including notes and imaging from previsou exam, outside providers and external EMR if available.   As well as notes that were available from care everywhere and other healthcare systems.  Past medical history, social, surgical and family history all reviewed in electronic medical record.  No pertanent information unless stated regarding to the chief complaint.   Past Medical History:  Diagnosis Date  . Allergy   . Asthma   . Osteoporosis     Allergies  Allergen Reactions  . Kiwi Extract Hives, Shortness Of Breath and Itching  . Latex   . Penicillins   . Sulfonamide Derivatives      Review of Systems:  No headache, visual changes, nausea, vomiting, diarrhea, constipation, dizziness, abdominal pain, skin rash, fevers, chills, night sweats, weight loss, swollen lymph nodes, body aches, joint swelling, chest pain, shortness of breath, mood changes. POSITIVE muscle aches  Objective  There were no vitals taken for this visit.   General: No apparent distress alert and oriented x3 mood and affect normal, dressed appropriately.  HEENT: Pupils equal, extraocular movements intact  Respiratory: Patient's speak in full sentences and does not appear short of breath  Cardiovascular: No lower  extremity edema, non tender, no erythema  Neuro: Cranial nerves II through XII are intact, neurovascularly intact in all extremities with 2+ DTRs and 2+ pulses.  Gait normal with good balance and coordination.  MSK:  Non tender with full range of motion and good stability and symmetric strength and tone of shoulders, elbows, wrist, hip, knee and ankles bilaterally.  Back -low back exam does show significant amount of stiffness noted in the paraspinal musculature.  Osteopathic findings  C2 flexed rotated and side bent right C7 flexed rotated and side bent left T3 extended rotated and side bent right inhaled rib T8 extended rotated and side bent left L2 flexed rotated and side bent right Sacrum right on right       Assessment and Plan:  Low back pain Patient does have signs and symptoms consistent with more of a back spasm.  Has responded well to manipulation in the past and did have good range of motion of the immediately.  Encourage patient to continue with the formal physical therapy.  We discussed with patient on different medications.  Patient has been taking the Flexeril and given a short course of anti-inflammatories.  Discussed which activities to do which wants to avoid.  Patient has had difficulty with anti-inflammatories with GI discomfort.  Patient will make some changes if that is possible.  Follow-up again in 4 to 6 weeks.    Nonallopathic problems  Decision today to treat with OMT was based on Physical Exam  After verbal consent patient was treated with HVLA,  ME, FPR techniques in cervical, rib, thoracic, lumbar, and sacral  areas  Patient tolerated the procedure well with improvement in symptoms  Patient given exercises, stretches and lifestyle modifications  See medications in patient instructions if given  Patient will follow up in 4-8 weeks      The above documentation has been reviewed and is accurate and complete Judi Saa, DO       Note: This  dictation was prepared with Dragon dictation along with smaller phrase technology. Any transcriptional errors that result from this process are unintentional.

## 2019-11-18 NOTE — Therapy (Signed)
[facility] [address] [place], [place], [address] Phone: [phone]   Fax:  [phone]  Physical Therapy Treatment  Patient Details  Name: Melanie [name] MRN: [id number] Date of Birth: May 10, 1961 Referring Provider (PT): Rodolph Bong, MD   Encounter Date: 11/17/2019   PT End of Session - 11/18/19 0506    Visit Number 5    Number of Visits 13    Date for PT Re-Evaluation 01/27/20    Authorization Type [place] [employer]    PT Start Time 1703    PT Stop Time 1804    PT Time Calculation (min) 61 min    Activity Tolerance Patient tolerated treatment well    Behavior During Therapy [facility] for tasks assessed/performed           Past Medical History:  Diagnosis Date  . Allergy   . Asthma   . Osteoporosis     Past Surgical History:  Procedure Laterality Date  . ABDOMINAL HYSTERECTOMY    . APPENDECTOMY    . KNEE SURGERY      There were no vitals filed for this visit.   Subjective Assessment - 11/17/19 1719    Subjective Pt rates her low back pain as a 3/10 today. Pt states the press ups have gotten easier being able to press up further (arms straight)  with decreased pressure in her low back.    Currently in Pain? Yes    Pain Score 3     Pain Location Back    Pain Orientation Posterior;Lower    Pain Descriptors / Indicators Aching    Pain Type Acute pain    Pain Radiating Towards NA    Pain Onset More than a month ago                             [facility] Adult PT Treatment/Exercise - 11/18/19 0001      Lumbar Exercises: Stretches   Lower Trunk Rotation 5 reps;10 seconds    Lower Trunk Rotation Limitations each direction    Press Ups 10 reps    Press Ups Limitations 3 sets, 1 sets unassisted pressure to stabize pelvis, 1 set assisted pressure, 1 set unassisted pressure      Lumbar Exercises: Aerobic   Nustep 6 mins; L 6; UE/LE      Lumbar Exercises: Supine   Pelvic Tilt 10 reps    Bridge 10  reps;2 seconds    Bridge Limitations with engaged core    Other Supine Lumbar Exercises 90-90 alternating toe taps; 8x2      Lumbar Exercises: Quadruped   Straight Leg Raise 10 reps;2 seconds    Straight Leg Raises Limitations ques for abs. engagement    Opposite Arm/Leg Raise 10 reps;20 reps    Opposite Arm/Leg Raise Limitations uqes for ab engagement    Other Quadruped Lumbar Exercises arm lifts; 2 sec    Other Quadruped Lumbar Exercises ques for abs engagement      Modalities   Modalities Cryotherapy      Cryotherapy   Number Minutes Cryotherapy 15 Minutes    Cryotherapy Location Lumbar Spine      Electrical Stimulation   Electrical Stimulation Location low back    Electrical Stimulation Action IFC    Electrical Stimulation Parameters CH1: 17  CH2: 10    Electrical Stimulation Goals Pain  PT Short Term Goals - 10/28/19 0612      PT SHORT TERM GOAL #1   Title Pt will be ind c an initial HEP    Baseline In progress    Time 3    Period Weeks    Status New    Target Date 11/18/19      PT SHORT TERM GOAL #2   Title Pt will report a pain range of 0-5 with daily activities    Baseline 4-7/10    Time 3    Period Weeks    Status New    Target Date 11/18/19             PT Long Term Goals - 10/28/19 0614      PT LONG TERM GOAL #1   Title Back motion of L SB and ext will demonstrate improved ROM with L SB to 35d and Ext full ROM    Baseline L SB 28d, ext 50% limited    Time 7    Period Weeks    Status New    Target Date 12/16/19      PT LONG TERM GOAL #2   Title Pt will report an improved low back pain range of 0-3 with sleeping and daily activities    Baseline 4-7/10    Time 7    Period Weeks    Status New    Target Date 12/16/19      PT LONG TERM GOAL #3   Title Pt will demonstrate understanding of proper posture and body mechanics for the reduction of pain and prevention of future injuries.    Time 7    Period Weeks     Status New    Target Date 12/16/19      PT LONG TERM GOAL #4   Title Pt will be Ind in a final HEP    Baseline In progress    Time 7    Period Weeks    Status New    Target Date 12/16/19      PT LONG TERM GOAL #5   Title Pt's FOTO score will improve to a 29% limitation    Baseline Initial score 50% limitation    Time 7    Period Weeks    Status New    Target Date 12/16/19                 Plan - 11/18/19 0510    Clinical Impression Statement PT continues to be focused on low back directional treatment for pain, core strengthening, and pain reduction. Pt is able to fully extend arm c prone press ups. Pt's overall subjective report indicates progressive improvement in pain. After completion of exercises, pt reported increased soreness. IFC and a cold pack were completed. At end of session, pt reported not experiencing low back pain.    Personal Factors and Comorbidities Past/Current Experience    Examination-Activity Limitations Carry;Sit;Sleep;Locomotion Level;Transfers;Squat;Stairs;Stand    Examination-Participation Restrictions Cleaning;Yard Work    Conservation officer, historic buildings Evolving/Moderate complexity    Clinical Decision Making Moderate    Rehab Potential Good    PT Frequency 2x / week    PT Duration 6 weeks    PT Treatment/Interventions Cryotherapy;Electrical Stimulation;Ultrasound;Traction;Moist Heat;Iontophoresis 4mg /ml Dexamethasone;Gait training;Stair training;Functional mobility training;Therapeutic activities;Therapeutic exercise;Neuromuscular re-education;Manual techniques;Patient/family education;Passive range of motion;Dry needling;Spinal Manipulations;Taping    PT Next Visit Plan Continue current course of care. Assess daily activities and affect on LBP. Provide education regarding posture and body mechnaics as indicated.  PT [place]           Patient will benefit from skilled therapeutic intervention in order to improve the  following deficits and impairments:  Decreased activity tolerance, Decreased mobility, Decreased strength, Improper body mechanics, Impaired flexibility, Decreased knowledge of precautions, Pain, Decreased range of motion, Difficulty walking  Visit Diagnosis: Acute left-sided low back pain without sciatica  Other abnormalities of gait and mobility  Decreased ROM of lumbar spine     Problem List Patient Active Problem List   Diagnosis Date Noted  . Acute sinus infection 07/12/2018  . Viral illness 07/08/2018  . Mild intermittent asthma without complication 61/60/7371  . Nonallopathic lesion of rib cage 01/28/2018  . Nonallopathic lesion of sacral region 12/24/2017  . ETD (Eustachian tube dysfunction), left 07/10/2017  . Greater trochanteric bursitis, right 06/25/2017  . Migraine headache 02/17/2017  . Intercostal pain 02/03/2017  . Polyarthralgia 08/23/2015  . Asthma with acute exacerbation 02/28/2015  . Quadriceps tendinitis 11/16/2014  . Neck pain 07/07/2014  . Concussion with no loss of consciousness 07/07/2014  . Nonallopathic lesion of lumbosacral region 11/04/2013  . Nonallopathic lesion of thoracic region 11/04/2013  . Piriformis syndrome of left side 10/14/2013  . Greater trochanteric bursitis of left hip 10/14/2013  . Trapezius muscle spasm 11/26/2012  . Nonallopathic lesion of cervicothoracic region 11/26/2012  . Seasonal allergies 09/28/2012  . Asthma attack 12/01/2011  . Backache 09/26/2009  . OTHER ACQUIRED DEFORMITY OF ANKLE AND FOOT OTHER 09/26/2009  . ABNORMALITY OF GAIT 09/26/2009  . KNEE PAIN, RIGHT 08/08/2009  . TENDINITIS, PATELLAR 08/08/2009    Gar Ponto MS, PT 11/18/19 5:29 AM  [facility] [address] [place], [place], [address] Phone: [phone]   Fax:  [phone]  Name: Melanie [name] MRN: [id number] Date of Birth: 1961-04-05

## 2019-11-18 NOTE — Patient Instructions (Addendum)
Good to see you.  Ice 20 minutes 2 times daily. Usually after activity and before bed. Samples of Duexis to try take 1 pill by mouth 3 times daily for 3 days See me again in 3-4 weeks

## 2019-11-18 NOTE — Assessment & Plan Note (Signed)
Patient does have signs and symptoms consistent with more of a back spasm.  Has responded well to manipulation in the past and did have good range of motion of the immediately.  Encourage patient to continue with the formal physical therapy.  We discussed with patient on different medications.  Patient has been taking the Flexeril and given a short course of anti-inflammatories.  Discussed which activities to do which wants to avoid.  Patient has had difficulty with anti-inflammatories with GI discomfort.  Patient will make some changes if that is possible.  Follow-up again in 4 to 6 weeks.

## 2019-11-22 ENCOUNTER — Encounter: Payer: Self-pay | Admitting: Physical Therapy

## 2019-11-22 ENCOUNTER — Other Ambulatory Visit: Payer: Self-pay

## 2019-11-22 ENCOUNTER — Ambulatory Visit: Payer: 59 | Admitting: Physical Therapy

## 2019-11-22 DIAGNOSIS — R2689 Other abnormalities of gait and mobility: Secondary | ICD-10-CM

## 2019-11-22 DIAGNOSIS — M545 Low back pain, unspecified: Secondary | ICD-10-CM

## 2019-11-22 DIAGNOSIS — M5386 Other specified dorsopathies, lumbar region: Secondary | ICD-10-CM

## 2019-11-22 NOTE — Therapy (Signed)
[facility] [address] [place], [place], [address] Phone: [phone]   Fax:  [phone]  Physical Therapy Treatment  Patient Details  Name: Melanie [name] MRN: [id number] Date of Birth: 09/19/1960 Referring Provider (PT): Rodolph Bong, MD   Encounter Date: 11/22/2019   PT End of Session - 11/22/19 1658    Visit Number 6    Number of Visits 13    Date for PT Re-Evaluation 01/27/20    Authorization Type [place] [employer]    Progress Note Due on Visit 10    PT Start Time 1657    PT Stop Time 1737    PT Time Calculation (min) 40 min    Activity Tolerance Patient tolerated treatment well    Behavior During Therapy [facility] for tasks assessed/performed           Past Medical History:  Diagnosis Date  . Allergy   . Asthma   . Osteoporosis     Past Surgical History:  Procedure Laterality Date  . ABDOMINAL HYSTERECTOMY    . APPENDECTOMY    . KNEE SURGERY      There were no vitals filed for this visit.   Subjective Assessment - 11/22/19 1656    Subjective Patient reports she is feeling better. She had her back adjusted by her osteopath and she has been doing the exercises which are helping.    Patient Stated Goals Less pain, more mobility, back to my regular activiites    Currently in Pain? Yes    Pain Score 1     Pain Location Back    Pain Orientation Lower    Pain Descriptors / Indicators Aching    Pain Type Acute pain    Pain Onset More than a month ago    Pain Frequency Constant                             [facility] Adult PT Treatment/Exercise - 11/22/19 0001      Exercises   Exercises Lumbar      Lumbar Exercises: Stretches   Hip Flexor Stretch 2 reps;20 seconds    Hip Flexor Stretch Limitations 1/2 kneeling with PPT      Lumbar Exercises: Aerobic   Nustep L5 x 5 min with UE and LE      Lumbar Exercises: Standing   Lifting 10 reps   2 sets   Lifting Limitations Deadlift with 25# on 6"  box      Lumbar Exercises: Supine   Dead Bug 10 reps   2 sets     Lumbar Exercises: Quadruped   Opposite Arm/Leg Raise 10 reps;5 seconds   2 sets   Other Quadruped Lumbar Exercises Donkey kick with 5# dumbbell 2x10 each                  PT Education - 11/22/19 1657    Education Details HEP    Person(s) Educated Patient    Methods Explanation;Demonstration;Verbal cues    Comprehension Verbalized understanding;Returned demonstration;Verbal cues required;Need further instruction            PT Short Term Goals - 11/22/19 1731      PT SHORT TERM GOAL #1   Title Pt will be ind c an initial HEP    Time 3    Period Weeks    Status Achieved    Target Date 11/18/19      PT SHORT TERM  GOAL #2   Title Pt will report a pain range of 0-5 with daily activities    Time 3    Period Weeks    Status Achieved    Target Date 11/18/19             PT Long Term Goals - 10/28/19 0614      PT LONG TERM GOAL #1   Title Back motion of L SB and ext will demonstrate improved ROM with L SB to 35d and Ext full ROM    Baseline L SB 28d, ext 50% limited    Time 7    Period Weeks    Status New    Target Date 12/16/19      PT LONG TERM GOAL #2   Title Pt will report an improved low back pain range of 0-3 with sleeping and daily activities    Baseline 4-7/10    Time 7    Period Weeks    Status New    Target Date 12/16/19      PT LONG TERM GOAL #3   Title Pt will demonstrate understanding of proper posture and body mechanics for the reduction of pain and prevention of future injuries.    Time 7    Period Weeks    Status New    Target Date 12/16/19      PT LONG TERM GOAL #4   Title Pt will be Ind in a final HEP    Baseline In progress    Time 7    Period Weeks    Status New    Target Date 12/16/19      PT LONG TERM GOAL #5   Title Pt's FOTO score will improve to a 29% limitation    Baseline Initial score 50% limitation    Time 7    Period Weeks    Status New     Target Date 12/16/19                 Plan - 11/22/19 1659    Clinical Impression Statement Patient tolerated therapy well with no adverse effects. Continued progression of core strengthening with good tolerance and progressed HEP. Worked on lifting and patient require cueing for proper mechanics to keep core engaged and perform hip hinge. Patient would benefit from continued skilled PT to improve back pain and core strength in order to progress back to previous activity level and exercise.    PT Treatment/Interventions Cryotherapy;Electrical Stimulation;Ultrasound;Traction;Moist Heat;Iontophoresis 4mg /ml Dexamethasone;Gait training;Stair training;Functional mobility training;Therapeutic activities;Therapeutic exercise;Neuromuscular re-education;Manual techniques;Patient/family education;Passive range of motion;Dry needling;Spinal Manipulations;Taping    PT Next Visit Plan Continue current course of care. Assess daily activities and affect on LBP. Provide education regarding posture and body mechnaics as indicated.    PT Home Exercise Plan [email or web address]    Consulted and Agree with Plan of Care Patient           Patient will benefit from skilled therapeutic intervention in order to improve the following deficits and impairments:  Decreased activity tolerance, Decreased mobility, Decreased strength, Improper body mechanics, Impaired flexibility, Decreased knowledge of precautions, Pain, Decreased range of motion, Difficulty walking  Visit Diagnosis: Acute left-sided low back pain without sciatica  Other abnormalities of gait and mobility  Decreased ROM of lumbar spine     Problem List Patient Active Problem List   Diagnosis Date Noted  . Low back pain 11/18/2019  . Acute sinus infection 07/12/2018  . Viral illness 07/08/2018  . Mild intermittent asthma without complication  07/08/2018  . Nonallopathic lesion of rib cage 01/28/2018  . Nonallopathic lesion of sacral region 12/24/2017    . ETD (Eustachian tube dysfunction), left 07/10/2017  . Greater trochanteric bursitis, right 06/25/2017  . Migraine headache 02/17/2017  . Intercostal pain 02/03/2017  . Polyarthralgia 08/23/2015  . Asthma with acute exacerbation 02/28/2015  . Quadriceps tendinitis 11/16/2014  . Neck pain 07/07/2014  . Concussion with no loss of consciousness 07/07/2014  . Nonallopathic lesion of lumbosacral region 11/04/2013  . Nonallopathic lesion of thoracic region 11/04/2013  . Piriformis syndrome of left side 10/14/2013  . Greater trochanteric bursitis of left hip 10/14/2013  . Trapezius muscle spasm 11/26/2012  . Nonallopathic lesion of cervicothoracic region 11/26/2012  . Seasonal allergies 09/28/2012  . Asthma attack 12/01/2011  . Backache 09/26/2009  . OTHER ACQUIRED DEFORMITY OF ANKLE AND FOOT OTHER 09/26/2009  . ABNORMALITY OF GAIT 09/26/2009  . KNEE PAIN, RIGHT 08/08/2009  . TENDINITIS, PATELLAR 08/08/2009    Rosana Hoes, PT, DPT, LAT, ATC 11/22/19  5:38 PM Phone: [phone] Fax: [phone]   [facility] Out[facility] [address] [place], [place], [address] Phone: [phone]   Fax:  [phone]  Name: Melanie [name] MRN: [id number] Date of Birth: June 26, 1960

## 2019-11-22 NOTE — Patient Instructions (Addendum)
Access Code: [facility] URL: [email or web address] Date: 11/22/2019 Prepared by: Rosana Hoes  Exercises Prone Press Up - 4 x daily - 7 x weekly - 1 sets - 10 reps Supine Bridge with Pelvic Floor Contraction on Swiss Ball - 1 x daily - 5 x weekly - 3 sets - 8-10 reps - 1-2 seconds hold Cat-Camel - 1 x daily - 5 x weekly - 10 reps Bird Dog - 1 x daily - 5 x weekly - 3 sets - 10 reps Standing Lumbar Extension - 4 x daily - 7 x weekly - 3 sets - 10 reps - 3 hold Supine Lower Trunk Rotation - 1 x daily - 7 x weekly - 3 sets - 5 reps - 5 hold Supine Dead Bug with Leg Extension - 1 x daily - 7 x weekly - 10 reps - 3 sets Half Kneeling Hip Flexor Stretch - 1 x daily - 7 x weekly - 3 reps - 20 seconds hold Half Dead Lift with Kettlebell - 1 x daily - 7 x weekly - 3 sets - 10 reps

## 2019-11-24 ENCOUNTER — Ambulatory Visit: Payer: 59

## 2019-11-24 ENCOUNTER — Other Ambulatory Visit: Payer: Self-pay

## 2019-11-24 DIAGNOSIS — M545 Low back pain, unspecified: Secondary | ICD-10-CM

## 2019-11-24 DIAGNOSIS — R2689 Other abnormalities of gait and mobility: Secondary | ICD-10-CM

## 2019-11-24 DIAGNOSIS — M5386 Other specified dorsopathies, lumbar region: Secondary | ICD-10-CM | POA: Diagnosis not present

## 2019-11-26 NOTE — Therapy (Signed)
[facility] [address] [place], [place], [address] Phone: [phone]   Fax:  [phone]  Physical Therapy Treatment  Patient Details  Name: Melanie [name] MRN: [id number] Date of Birth: [birth year] Referring Provider (PT): Rodolph Bong, MD   Encounter Date: 11/24/2019   PT End of Session - 11/26/19 1438    Visit Number 7    Date for PT Re-Evaluation 01/27/20    Authorization Type [place] [employer]    Progress Note Due on Visit 10    PT Start Time 1706    PT Stop Time 1810    PT Time Calculation (min) 64 min    Activity Tolerance Patient tolerated treatment well    Behavior During Therapy [facility] for tasks assessed/performed           Past Medical History:  Diagnosis Date  . Allergy   . Asthma   . Osteoporosis     Past Surgical History:  Procedure Laterality Date  . ABDOMINAL HYSTERECTOMY    . APPENDECTOMY    . KNEE SURGERY      There were no vitals filed for this visit.   Subjective Assessment - 11/26/19 1436    Subjective Pt reports a low back pain of 4/10. Pt noticed tightness of her low back when turning to back up in her car. Pt states she is walking approx. 2-3 miles a day. Pt states she is now sleeping comfortably at night and is able to turn pain free most of the time.    Pain Onset More than a month ago                             [facility] Adult PT Treatment/Exercise - 11/26/19 0001      Lumbar Exercises: Stretches   Lower Trunk Rotation 5 reps;10 seconds    Lower Trunk Rotation Limitations each direction    Hip Flexor Stretch 2 reps;20 seconds    Hip Flexor Stretch Limitations 1/2 kneeling with PPT    Press Ups 10 reps    Press Ups Limitations 3 sets, 1 sets unassisted pressure to stabize pelvis, 1 set assisted pressure, 1 set unassisted pressure      Lumbar Exercises: Aerobic   Nustep L5 x 5 min with UE and LE      Lumbar Exercises: Supine   Dead Bug 10 reps   2 sets      Lumbar Exercises: Quadruped   Opposite Arm/Leg Raise 10 reps;5 seconds   2 sets   Other Quadruped Lumbar Exercises Donkey kick with 5# dumbbell 2x10 each      Modalities   Modalities Cryotherapy      Cryotherapy   Number Minutes Cryotherapy 15 Minutes    Cryotherapy Location Lumbar Spine    Type of Cryotherapy Ice pack      Electrical Stimulation   Electrical Stimulation Location lumbar spine    Electrical Stimulation Action IFC    Electrical Stimulation Parameters ch1:9; ch2:9    Electrical Stimulation Goals Pain                    PT Short Term Goals - 11/22/19 1731      PT SHORT TERM GOAL #1   Title Pt will be ind c an initial HEP    Time 3    Period Weeks    Status Achieved    Target Date 11/18/19      PT  SHORT TERM GOAL #2   Title Pt will report a pain range of 0-5 with daily activities    Time 3    Period Weeks    Status Achieved    Target Date 11/18/19             PT Long Term Goals - 10/28/19 0614      PT LONG TERM GOAL #1   Title Back motion of L SB and ext will demonstrate improved ROM with L SB to 35d and Ext full ROM    Baseline L SB 28d, ext 50% limited    Time 7    Period Weeks    Status New    Target Date 12/16/19      PT LONG TERM GOAL #2   Title Pt will report an improved low back pain range of 0-3 with sleeping and daily activities    Baseline 4-7/10    Time 7    Period Weeks    Status New    Target Date 12/16/19      PT LONG TERM GOAL #3   Title Pt will demonstrate understanding of proper posture and body mechanics for the reduction of pain and prevention of future injuries.    Time 7    Period Weeks    Status New    Target Date 12/16/19      PT LONG TERM GOAL #4   Title Pt will be Ind in a final HEP    Baseline In progress    Time 7    Period Weeks    Status New    Target Date 12/16/19      PT LONG TERM GOAL #5   Title Pt's FOTO score will improve to a 29% limitation    Baseline Initial score 50% limitation     Time 7    Period Weeks    Status New    Target Date 12/16/19                 Plan - 11/26/19 1441    Clinical Impression Statement PT focused on low back and abdominal strengthening, lumbar extension mobility, and pain management with the usec the use of a cold pack and IFC. Lumbar extension ROM is limited c pr experincing a pressure sensation at end range. Pt reports no LBP at end of session.    Personal Factors and Comorbidities Past/Current Experience    Examination-Activity Limitations Carry;Sit;Sleep;Locomotion Level;Transfers;Squat;Stairs;Stand    Examination-Participation Restrictions Cleaning;Yard Work    Conservation officer, historic buildings Evolving/Moderate complexity    Clinical Decision Making Moderate    Rehab Potential Good    PT Frequency 2x / week    PT Duration 6 weeks    PT Treatment/Interventions Cryotherapy;Electrical Stimulation;Ultrasound;Traction;Moist Heat;Iontophoresis 4mg /ml Dexamethasone;Gait training;Stair training;Functional mobility training;Therapeutic activities;Therapeutic exercise;Neuromuscular re-education;Manual techniques;Patient/family education;Passive range of motion;Dry needling;Spinal Manipulations;Taping    PT Next Visit Plan Continue PT to address core strength, lumbar ROM, and pain management.    PT Home Exercise Plan [email or web address]    Consulted and Agree with Plan of Care Patient           Patient will benefit from skilled therapeutic intervention in order to improve the following deficits and impairments:  Decreased activity tolerance, Decreased mobility, Decreased strength, Improper body mechanics, Impaired flexibility, Decreased knowledge of precautions, Pain, Decreased range of motion, Difficulty walking  Visit Diagnosis: Acute left-sided low back pain without sciatica  Other abnormalities of gait and mobility  Decreased ROM of lumbar spine  Problem List Patient Active Problem List   Diagnosis Date Noted  . Low back pain  11/18/2019  . Acute sinus infection 07/12/2018  . Viral illness 07/08/2018  . Mild intermittent asthma without complication 54/27/0623  . Nonallopathic lesion of rib cage 01/28/2018  . Nonallopathic lesion of sacral region 12/24/2017  . ETD (Eustachian tube dysfunction), left 07/10/2017  . Greater trochanteric bursitis, right 06/25/2017  . Migraine headache 02/17/2017  . Intercostal pain 02/03/2017  . Polyarthralgia 08/23/2015  . Asthma with acute exacerbation 02/28/2015  . Quadriceps tendinitis 11/16/2014  . Neck pain 07/07/2014  . Concussion with no loss of consciousness 07/07/2014  . Nonallopathic lesion of lumbosacral region 11/04/2013  . Nonallopathic lesion of thoracic region 11/04/2013  . Piriformis syndrome of left side 10/14/2013  . Greater trochanteric bursitis of left hip 10/14/2013  . Trapezius muscle spasm 11/26/2012  . Nonallopathic lesion of cervicothoracic region 11/26/2012  . Seasonal allergies 09/28/2012  . Asthma attack 12/01/2011  . Backache 09/26/2009  . OTHER ACQUIRED DEFORMITY OF ANKLE AND FOOT OTHER 09/26/2009  . ABNORMALITY OF GAIT 09/26/2009  . KNEE PAIN, RIGHT 08/08/2009  . TENDINITIS, PATELLAR 08/08/2009   Gar Ponto MS, PT 11/26/19 2:56 PM  [facility] [address] [place], [place], [address] Phone: [phone]   Fax:  [phone]  Name: Melanie [name] MRN: [id number] Date of Birth: 1960-08-29

## 2019-11-29 ENCOUNTER — Encounter: Payer: Self-pay | Admitting: Physical Therapy

## 2019-11-29 ENCOUNTER — Ambulatory Visit: Payer: 59 | Admitting: Physical Therapy

## 2019-11-29 ENCOUNTER — Other Ambulatory Visit: Payer: Self-pay

## 2019-11-29 DIAGNOSIS — M5386 Other specified dorsopathies, lumbar region: Secondary | ICD-10-CM

## 2019-11-29 DIAGNOSIS — R2689 Other abnormalities of gait and mobility: Secondary | ICD-10-CM

## 2019-11-29 DIAGNOSIS — M545 Low back pain, unspecified: Secondary | ICD-10-CM

## 2019-11-29 NOTE — Therapy (Signed)
[facility] [address] [place], [place], [address] Phone: [phone]   Fax:  [phone]  Physical Therapy Treatment  Patient Details  Name: Melanie [name] MRN: [id number] Date of Birth: 03-14-1961 Referring Provider (PT): Rodolph Bong, MD   Encounter Date: 11/29/2019   PT End of Session - 11/29/19 1656    Visit Number 8    Number of Visits 13    Date for PT Re-Evaluation 01/27/20    Authorization Type [place] [employer]    Progress Note Due on Visit 10    PT Start Time 1700    PT Stop Time 1740    PT Time Calculation (min) 40 min    Activity Tolerance Patient tolerated treatment well    Behavior During Therapy [facility] for tasks assessed/performed           Past Medical History:  Diagnosis Date  . Allergy   . Asthma   . Osteoporosis     Past Surgical History:  Procedure Laterality Date  . ABDOMINAL HYSTERECTOMY    . APPENDECTOMY    . KNEE SURGERY      There were no vitals filed for this visit.   Subjective Assessment - 11/29/19 1656    Subjective Patient reports she is doing well. Exercises are going pretty good. She is having a little pain.    Patient Stated Goals Less pain, more mobility, back to my regular activiites    Currently in Pain? Yes    Pain Score 4     Pain Location Back    Pain Orientation Lower    Pain Descriptors / Indicators Aching    Pain Type Chronic pain    Pain Onset More than a month ago    Pain Frequency Intermittent                             [facility] Adult PT Treatment/Exercise - 11/29/19 0001      Exercises   Exercises Lumbar      Lumbar Exercises: Aerobic   Nustep L6 x 4 min with UE and LE      Lumbar Exercises: Standing   Lifting 10 reps   3 sets   Lifting Limitations Deadlift with 30# on 4" box    Other Standing Lumbar Exercises Pallof press with blue band 3x5 with 5 sec hold      Lumbar Exercises: Supine   Dead Bug 10 reps   3 sets   Other Supine  Lumbar Exercises LTR with feet elevated 3x10      Lumbar Exercises: Quadruped   Madcat/Old Horse 10 reps    Opposite Arm/Leg Raise 10 reps;3 seconds   2 sets   Other Quadruped Lumbar Exercises Thread the needle to ceiling reach 5 each side                  PT Education - 11/29/19 1656    Education Details HEP    Person(s) Educated Patient    Methods Explanation;Demonstration;Verbal cues    Comprehension Verbalized understanding;Returned demonstration;Verbal cues required;Need further instruction            PT Short Term Goals - 11/22/19 1731      PT SHORT TERM GOAL #1   Title Pt will be ind c an initial HEP    Time 3    Period Weeks    Status Achieved    Target Date 11/18/19  PT SHORT TERM GOAL #2   Title Pt will report a pain range of 0-5 with daily activities    Time 3    Period Weeks    Status Achieved    Target Date 11/18/19             PT Long Term Goals - 10/28/19 0614      PT LONG TERM GOAL #1   Title Back motion of L SB and ext will demonstrate improved ROM with L SB to 35d and Ext full ROM    Baseline L SB 28d, ext 50% limited    Time 7    Period Weeks    Status New    Target Date 12/16/19      PT LONG TERM GOAL #2   Title Pt will report an improved low back pain range of 0-3 with sleeping and daily activities    Baseline 4-7/10    Time 7    Period Weeks    Status New    Target Date 12/16/19      PT LONG TERM GOAL #3   Title Pt will demonstrate understanding of proper posture and body mechanics for the reduction of pain and prevention of future injuries.    Time 7    Period Weeks    Status New    Target Date 12/16/19      PT LONG TERM GOAL #4   Title Pt will be Ind in a final HEP    Baseline In progress    Time 7    Period Weeks    Status New    Target Date 12/16/19      PT LONG TERM GOAL #5   Title Pt's FOTO score will improve to a 29% limitation    Baseline Initial score 50% limitation    Time 7    Period Weeks     Status New    Target Date 12/16/19                 Plan - 11/29/19 1657    Clinical Impression Statement Patient tolerated therapy well with no adverse effects. Continued hip and core strengthening with good tolerance and no report of increased pain. Working on Architectural technologist and incorporated rotation core strengthening as quick rotational movements due aggravate patient's symptoms. Patient would benefit from continued skilled PT to improve back pain and core strength in order to progress back to previous activity level and exercise.    PT Treatment/Interventions Cryotherapy;Electrical Stimulation;Ultrasound;Traction;Moist Heat;Iontophoresis 4mg /ml Dexamethasone;Gait training;Stair training;Functional mobility training;Therapeutic activities;Therapeutic exercise;Neuromuscular re-education;Manual techniques;Patient/family education;Passive range of motion;Dry needling;Spinal Manipulations;Taping    PT Next Visit Plan Continue PT to address core strength, lumbar ROM, and pain management.    PT Home Exercise Plan [id number]    Consulted and Agree with Plan of Care Patient           Patient will benefit from skilled therapeutic intervention in order to improve the following deficits and impairments:  Decreased activity tolerance, Decreased mobility, Decreased strength, Improper body mechanics, Impaired flexibility, Decreased knowledge of precautions, Pain, Decreased range of motion, Difficulty walking  Visit Diagnosis: Acute left-sided low back pain without sciatica  Other abnormalities of gait and mobility  Decreased ROM of lumbar spine     Problem List Patient Active Problem List   Diagnosis Date Noted  . Low back pain 11/18/2019  . Acute sinus infection 07/12/2018  . Viral illness 07/08/2018  . Mild intermittent asthma without complication 07/08/2018  . Nonallopathic lesion  of rib cage 01/28/2018  . Nonallopathic lesion of sacral region 12/24/2017  . ETD (Eustachian tube  dysfunction), left 07/10/2017  . Greater trochanteric bursitis, right 06/25/2017  . Migraine headache 02/17/2017  . Intercostal pain 02/03/2017  . Polyarthralgia 08/23/2015  . Asthma with acute exacerbation 02/28/2015  . Quadriceps tendinitis 11/16/2014  . Neck pain 07/07/2014  . Concussion with no loss of consciousness 07/07/2014  . Nonallopathic lesion of lumbosacral region 11/04/2013  . Nonallopathic lesion of thoracic region 11/04/2013  . Piriformis syndrome of left side 10/14/2013  . Greater trochanteric bursitis of left hip 10/14/2013  . Trapezius muscle spasm 11/26/2012  . Nonallopathic lesion of cervicothoracic region 11/26/2012  . Seasonal allergies 09/28/2012  . Asthma attack 12/01/2011  . Backache 09/26/2009  . OTHER ACQUIRED DEFORMITY OF ANKLE AND FOOT OTHER 09/26/2009  . ABNORMALITY OF GAIT 09/26/2009  . KNEE PAIN, RIGHT 08/08/2009  . TENDINITIS, PATELLAR 08/08/2009    Hilda Blades, PT, DPT, LAT, ATC 11/29/19  5:40 PM Phone: [phone] Fax: [facility] [address] [place], [place], [address] Phone: [phone]   Fax:  [phone]  Name: Melanie [name] MRN: [id number] Date of Birth: 10-02-1960

## 2019-12-01 ENCOUNTER — Ambulatory Visit: Payer: 59

## 2019-12-06 ENCOUNTER — Ambulatory Visit: Payer: 59 | Admitting: Physical Therapy

## 2019-12-06 ENCOUNTER — Other Ambulatory Visit: Payer: Self-pay

## 2019-12-06 ENCOUNTER — Encounter: Payer: Self-pay | Admitting: Physical Therapy

## 2019-12-06 DIAGNOSIS — M5386 Other specified dorsopathies, lumbar region: Secondary | ICD-10-CM | POA: Diagnosis not present

## 2019-12-06 DIAGNOSIS — R2689 Other abnormalities of gait and mobility: Secondary | ICD-10-CM | POA: Diagnosis not present

## 2019-12-06 DIAGNOSIS — M545 Low back pain, unspecified: Secondary | ICD-10-CM

## 2019-12-06 NOTE — Therapy (Signed)
[facility] [address] [place], [place], [address] Phone: [phone]   Fax:  [phone]  Physical Therapy Treatment  Patient Details  Name: LEEONA MCCARDLE MRN: [id number] Date of Birth: [birth year] Referring Provider (PT): Rodolph Bong, MD   Encounter Date: 12/06/2019   PT End of Session - 12/06/19 1712    Visit Number 9    Number of Visits 13    Date for PT Re-Evaluation 01/27/20    Authorization Type  [facility]    Progress Note Due on Visit 10    PT Start Time 1702    PT Stop Time 1743    PT Time Calculation (min) 41 min    Activity Tolerance Patient tolerated treatment well    Behavior During Therapy [facility] for tasks assessed/performed           Past Medical History:  Diagnosis Date  . Allergy   . Asthma   . Osteoporosis     Past Surgical History:  Procedure Laterality Date  . ABDOMINAL HYSTERECTOMY    . APPENDECTOMY    . KNEE SURGERY      There were no vitals filed for this visit.   Subjective Assessment - 12/06/19 1709    Subjective Patient reports she missed her last appointment because she has a migraine. She is feeling better now. Exercises are still going well, she did weights yesterday and her right hamstring is a little tight today.    Patient Stated Goals Less pain, more mobility, back to my regular activiites    Currently in Pain? No/denies                             [facility] Adult PT Treatment/Exercise - 12/06/19 0001      Exercises   Exercises Lumbar      Lumbar Exercises: Stretches   Lower Trunk Rotation 3 reps   2 sets   Lower Trunk Rotation Limitations figure-4 position      Lumbar Exercises: Aerobic   Nustep L6 x 5 min with UE and LE      Lumbar Exercises: Standing   Lifting 10 reps   2 sets   Lifting Limitations Deadlift with 30#    Other Standing Lumbar Exercises Pallof press with blue band 2x5 with 5 sec hold    Other Standing Lumbar Exercises Farmers  carry unilateral 25# x2 lengths each      Lumbar Exercises: Supine   Dead Bug 10 reps   2 sets   Dead Bug Limitations swiss ball      Lumbar Exercises: Quadruped   Opposite Arm/Leg Raise 5 reps;3 seconds   2 sets   Opposite Arm/Leg Raise Limitations elbow to knee    Other Quadruped Lumbar Exercises Thread the needle with ceiling reach 2x5 each side                  PT Education - 12/06/19 1711    Education Details HEP    Person(s) Educated Patient    Methods Explanation;Demonstration;Verbal cues    Comprehension Verbalized understanding;Returned demonstration;Verbal cues required;Need further instruction            PT Short Term Goals - 11/22/19 1731      PT SHORT TERM GOAL #1   Title Pt will be ind c an initial HEP    Time 3    Period Weeks    Status Achieved    Target Date  11/18/19      PT SHORT TERM GOAL #2   Title Pt will report a pain range of 0-5 with daily activities    Time 3    Period Weeks    Status Achieved    Target Date 11/18/19             PT Long Term Goals - 10/28/19 0614      PT LONG TERM GOAL #1   Title Back motion of L SB and ext will demonstrate improved ROM with L SB to 35d and Ext full ROM    Baseline L SB 28d, ext 50% limited    Time 7    Period Weeks    Status New    Target Date 12/16/19      PT LONG TERM GOAL #2   Title Pt will report an improved low back pain range of 0-3 with sleeping and daily activities    Baseline 4-7/10    Time 7    Period Weeks    Status New    Target Date 12/16/19      PT LONG TERM GOAL #3   Title Pt will demonstrate understanding of proper posture and body mechanics for the reduction of pain and prevention of future injuries.    Time 7    Period Weeks    Status New    Target Date 12/16/19      PT LONG TERM GOAL #4   Title Pt will be Ind in a final HEP    Baseline In progress    Time 7    Period Weeks    Status New    Target Date 12/16/19      PT LONG TERM GOAL #5   Title Pt's FOTO  score will improve to a 29% limitation    Baseline Initial score 50% limitation    Time 7    Period Weeks    Status New    Target Date 12/16/19                 Plan - 12/06/19 1712    Clinical Impression Statement Patient tolerated therapy well with no adverse effects. Progressed core strengthening this visit with good tolerance. She did note a difference with performing unilateral farmer's carry stating that left side felt weaker and with less control. HEP was progressed to include farmer's carry and pallof press. Patient would benefit from continued skilled PT to improve back pain and core strength in order to progress back to previous activity level and exercise.    PT Treatment/Interventions Cryotherapy;Electrical Stimulation;Ultrasound;Traction;Moist Heat;Iontophoresis 4mg /ml Dexamethasone;Gait training;Stair training;Functional mobility training;Therapeutic activities;Therapeutic exercise;Neuromuscular re-education;Manual techniques;Patient/family education;Passive range of motion;Dry needling;Spinal Manipulations;Taping    PT Next Visit Plan Continue PT to address core strength, lumbar ROM, and pain management.    PT Home Exercise Plan [id number]    Consulted and Agree with Plan of Care Patient           Patient will benefit from skilled therapeutic intervention in order to improve the following deficits and impairments:  Decreased activity tolerance, Decreased mobility, Decreased strength, Improper body mechanics, Impaired flexibility, Decreased knowledge of precautions, Pain, Decreased range of motion, Difficulty walking  Visit Diagnosis: Acute left-sided low back pain without sciatica  Other abnormalities of gait and mobility  Decreased ROM of lumbar spine     Problem List Patient Active Problem List   Diagnosis Date Noted  . Low back pain 11/18/2019  . Acute sinus infection 07/12/2018  . Viral illness  07/08/2018  . Mild intermittent asthma without complication  07/08/2018  . Nonallopathic lesion of rib cage 01/28/2018  . Nonallopathic lesion of sacral region 12/24/2017  . ETD (Eustachian tube dysfunction), left 07/10/2017  . Greater trochanteric bursitis, right 06/25/2017  . Migraine headache 02/17/2017  . Intercostal pain 02/03/2017  . Polyarthralgia 08/23/2015  . Asthma with acute exacerbation 02/28/2015  . Quadriceps tendinitis 11/16/2014  . Neck pain 07/07/2014  . Concussion with no loss of consciousness 07/07/2014  . Nonallopathic lesion of lumbosacral region 11/04/2013  . Nonallopathic lesion of thoracic region 11/04/2013  . Piriformis syndrome of left side 10/14/2013  . Greater trochanteric bursitis of left hip 10/14/2013  . Trapezius muscle spasm 11/26/2012  . Nonallopathic lesion of cervicothoracic region 11/26/2012  . Seasonal allergies 09/28/2012  . Asthma attack 12/01/2011  . Backache 09/26/2009  . OTHER ACQUIRED DEFORMITY OF ANKLE AND FOOT OTHER 09/26/2009  . ABNORMALITY OF GAIT 09/26/2009  . KNEE PAIN, RIGHT 08/08/2009  . TENDINITIS, PATELLAR 08/08/2009    Rosana Hoes, PT, DPT, LAT, ATC 12/06/19  5:58 PM Phone: [phone] Fax: [phone]   [facility] Out[facility] [address] [place], [place], [address] Phone: [phone]   Fax:  [phone]  Name: HAILEA EAGLIN MRN: [id number] Date of Birth: 09/07/1960

## 2019-12-06 NOTE — Patient Instructions (Signed)
Access Code: [facility] URL: [email or web address] Date: 12/06/2019 Prepared by: Rosana Hoes  Exercises Prone Press Up - 4 x daily - 1 sets - 10 reps Supine Bridge with Pelvic Floor Contraction on Swiss Ball - 1 x daily - 3 sets - 8-10 reps - 1-2 seconds hold Cat-Camel - 1 x daily - 10 reps Bird Dog - 1 x daily - 3 sets - 10 reps Standing Lumbar Extension - 4 x daily - 3 sets - 10 reps - 3 hold Supine Lower Trunk Rotation - 1 x daily - 3 sets - 5 reps - 5 hold Supine Dead Bug with Leg Extension - 1 x daily - 10 reps - 3 sets Half Kneeling Hip Flexor Stretch - 1 x daily - 3 reps - 20 seconds hold Half Dead Lift with Kettlebell - 1 x daily - 3 sets - 10 reps Farmer's Carry with Kettlebells - 1 x daily - 3 reps - 30 seconds hold Standing Anti-Rotation Press with Anchored Resistance - 1 x daily - 3 sets - 5 reps - 5 seconds hold

## 2019-12-07 ENCOUNTER — Encounter: Payer: Self-pay | Admitting: Family Medicine

## 2019-12-07 ENCOUNTER — Ambulatory Visit: Payer: 59 | Admitting: Family Medicine

## 2019-12-07 VITALS — BP 118/84 | HR 87 | Ht 62.0 in

## 2019-12-07 DIAGNOSIS — M62838 Other muscle spasm: Secondary | ICD-10-CM | POA: Diagnosis not present

## 2019-12-07 DIAGNOSIS — M545 Low back pain, unspecified: Secondary | ICD-10-CM

## 2019-12-07 DIAGNOSIS — M999 Biomechanical lesion, unspecified: Secondary | ICD-10-CM

## 2019-12-07 NOTE — Patient Instructions (Addendum)
[facility] [phone] [address] [place], [place] [address]  [employer] Fee: $30 for 30 visits Participants must purchase a 30-visit access pass. Pass expires one year from the date of purchase  Much much better Keep up with exercises Try peppermint for migraines See me again in 7-8 weeks

## 2019-12-07 NOTE — Progress Notes (Signed)
[name] [address] [place] [address] Phone: [phone] Subjective:   [name], am serving as a scribe for Dr. Antoine Primas. This visit occurred during the SARS-CoV-2 public health emergency.  Safety protocols were in place, including screening questions prior to the visit, additional usage of staff PPE, and extensive cleaning of exam room while observing appropriate contact time as indicated for disinfecting solutions.   I'm seeing this patient by the request  of:  Pincus Sanes, MD  CC: Back pain follow-up  MPN:[id number]  Melanie [name] is a 59 y.o. female coming in with complaint of back pain. OMT 11/18/2019. Does feel improvement. Turning head around in car bothers her the most. Also notes tightness in upper traps that is causing migraines since back injury. Patient states that the lower back seems to be better.  Noticing some increasing tightness of the upper back.  Having some increasing headaches as well.  Has not had a migraine yet but knows that one is coming if the weather does change which is one of the triggers.     Past Medical History:  Diagnosis Date  . Allergy   . Asthma   . Osteoporosis    Past Surgical History:  Procedure Laterality Date  . ABDOMINAL HYSTERECTOMY    . APPENDECTOMY    . KNEE SURGERY     Social History   Socioeconomic History  . Marital status: Divorced    Spouse name: Not on file  . Number of children: Not on file  . Years of education: Not on file  . Highest education level: Not on file  Occupational History  . Not on file  Tobacco Use  . Smoking status: Never Smoker  . Smokeless tobacco: Never Used  Vaping Use  . Vaping Use: Never used  Substance and Sexual Activity  . Alcohol use: Yes    Comment: 2/wk  . Drug use: No  . Sexual activity: Not on file  Other Topics Concern  . Not on file  Social History Narrative   Exercises regularly   Social Determinants of Health    Financial Resource Strain:   . Difficulty of Paying Living Expenses:   Food Insecurity:   . Worried About Programme researcher, broadcasting/film/video in the Last Year:   . Barista in the Last Year:   Transportation Needs:   . Freight forwarder (Medical):   [name] Lack of Transportation (Non-Medical):   Physical Activity:   . Days of Exercise per Week:   . Minutes of Exercise per Session:   Stress:   . Feeling of Stress :   Social Connections:   . Frequency of Communication with Friends and Family:   . Frequency of Social Gatherings with Friends and Family:   . Attends Religious Services:   . Active Member of Clubs or Organizations:   . Attends Banker Meetings:   [name] Marital Status:    Allergies  Allergen Reactions  . Kiwi Extract Hives, Shortness Of Breath and Itching  . Latex   . Penicillins   . Sulfonamide Derivatives    Family History  Problem Relation Age of Onset  . Heart disease Father 2  . Irregular heart beat Mother   . Stroke Maternal Grandmother   . Heart attack Maternal Grandfather   . Stroke Paternal Grandmother   . Heart attack Paternal Grandfather   . Heart attack Brother 45  . Breast cancer Neg Hx  Current Outpatient Medications (Respiratory):  .  albuterol (PROAIR HFA) 108 (90 Base) MCG/ACT inhaler, Inhale 1 puff into the lungs every 6 (six) hours as needed for wheezing or shortness of breath. [name]  azelastine (ASTELIN) 0.1 % nasal spray, Place 1 spray into both nostrils 2 (two) times daily. .  benzonatate (TESSALON PERLES) 100 MG capsule, Take 1 capsule (100 mg total) by mouth 3 (three) times daily as needed. .  fluticasone (FLONASE) 50 MCG/ACT nasal spray, Place 2 sprays into both nostrils daily. .  fluticasone (FLOVENT HFA) 110 MCG/ACT inhaler, Inhale 1 puff into the lungs 2 (two) times daily. [name]  ipratropium (ATROVENT) 0.03 % nasal spray, Place 2 sprays into both nostrils every 12 (twelve) hours. [name]  loratadine (CLARITIN) 10 MG tablet, Take 10 mg  by mouth daily.  Current Outpatient Medications (Analgesics):  .  HYDROcodone-acetaminophen (NORCO/VICODIN) 5-325 MG tablet, Take 1 tablet by mouth every 6 (six) hours as needed. .  rizatriptan (MAXALT-MLT) 10 MG disintegrating tablet, Take 1 tablet (10 mg total) by mouth as needed for migraine. May repeat in 2 hours if needed   Current Outpatient Medications (Other):  .  cholecalciferol (VITAMIN D) 1000 units tablet, Take 2,000 Units by mouth daily. .  cyclobenzaprine (FLEXERIL) 5 MG tablet, Take 1-2 tablets (5-10 mg total) by mouth at bedtime as needed for muscle spasms. .  Flaxseed, Linseed, (FLAX SEED OIL) 1000 MG CAPS, Take 1,000 mg by mouth. .  Multiple Vitamin (MULTIVITAMIN) tablet, Take 1 tablet by mouth daily. .  Probiotic Product (PROBIOTIC DAILY PO), Take by mouth.   Reviewed prior external information including notes and imaging from  primary care provider As well as notes that were available from care everywhere and other healthcare systems.  Past medical history, social, surgical and family history all reviewed in electronic medical record.  No pertanent information unless stated regarding to the chief complaint.   Review of Systems:  No headache, visual changes, nausea, vomiting, diarrhea, constipation, dizziness, abdominal pain, skin rash, fevers, chills, night sweats, weight loss, swollen lymph nodes, body aches, joint swelling, chest pain, shortness of breath, mood changes. POSITIVE muscle aches  Objective  Blood pressure 118/84, pulse 87, height 5\' 2"  (1.575 m), SpO2 93 %.   General: No apparent distress alert and oriented x3 mood and affect normal, dressed appropriately.  HEENT: Pupils equal, extraocular movements intact  Respiratory: Patient's speak in full sentences and does not appear short of breath  Cardiovascular: No lower extremity edema, non tender, no erythema  Neuro: Cranial nerves II through XII are intact, neurovascularly intact in all extremities with  2+ DTRs and 2+ pulses.  Gait normal with good balance and coordination.  MSK:  tender with full range of motion and good stability and symmetric strength and tone of shoulders, elbows, wrist, hip, knee and ankles bilaterally.  Neck exam loss of lordosis.  Patient does have mild spasm of the right trapezius.  Patient has a negative Spurling's.  Does have some loss of 5 degrees of extension of the neck though noted.  Osteopathic findings C2 flexed rotated and side bent right C7 flexed rotated and side bent left T3 extended rotated and side bent right inhaled third rib T5 extended rotated and side bent left L1 flexed rotated and side bent right Sacrum right on right    Impression and Recommendations:     The above documentation has been reviewed and is accurate and complete , DO       Note: This dictation  was prepared with Dragon dictation along with smaller phrase technology. Any transcriptional errors that result from this process are unintentional.

## 2019-12-07 NOTE — Assessment & Plan Note (Signed)
Tightness noted.  Chronic problem with mild exacerbation.  Continues to respond well to osteopathic manipulation done.  Patient does have muscle relaxers and encouraged her to take the Flexeril at night if needed.  Patient's lower back pain seems to be improved at this time.  Discussed icing regimen.  Continue with core strengthening.  Follow-up again in 4 to 8 weeks

## 2019-12-07 NOTE — Assessment & Plan Note (Signed)

## 2019-12-07 NOTE — Assessment & Plan Note (Signed)
Back pain is still there but improving.  1 more round of physical therapy and I think patient will do well.  Discussed icing regimen and home exercise, increase activity slowly.  Follow-up again in 4 to 8 weeks

## 2019-12-08 ENCOUNTER — Other Ambulatory Visit: Payer: Self-pay

## 2019-12-08 ENCOUNTER — Ambulatory Visit: Payer: 59 | Attending: Family Medicine

## 2019-12-08 DIAGNOSIS — M545 Low back pain, unspecified: Secondary | ICD-10-CM

## 2019-12-08 DIAGNOSIS — R2689 Other abnormalities of gait and mobility: Secondary | ICD-10-CM | POA: Insufficient documentation

## 2019-12-08 DIAGNOSIS — M5386 Other specified dorsopathies, lumbar region: Secondary | ICD-10-CM | POA: Diagnosis not present

## 2019-12-13 NOTE — Patient Instructions (Signed)
Cervical retractions supine, cervical retractions c upper cervical side bends, scapular retractions were provided for muscular discomfort and fatigue associated with work on Animator.

## 2019-12-13 NOTE — Therapy (Signed)
[place], [place], [address] Phone: [phone]   Fax:  [phone]  Physical Therapy Evaluation/Progress Note Progress Note Reporting Period 10/26/19 to 12/08/19  See note below for Objective Data and Assessment of Progress/Goals.       Patient Details  Name: Melanie [name] MRN: [id number] Date of Birth: 1961-03-09 Referring Provider (PT): Gregor Hams, MD   Encounter Date: 12/08/2019   PT End of Session - 12/13/19 0637    Visit Number 10    Number of Visits 13    Date for PT Re-Evaluation 01/27/20    Authorization Type [place] [employer]    Progress Note Due on Visit 10    PT Start Time 1713    PT Stop Time 1801    PT Time Calculation (min) 48 min    Activity Tolerance Patient tolerated treatment well    Behavior During Therapy [facility] for tasks assessed/performed           Past Medical History:  Diagnosis Date  . Allergy   . Asthma   . Osteoporosis     Past Surgical History:  Procedure Laterality Date  . ABDOMINAL HYSTERECTOMY    . APPENDECTOMY    . KNEE SURGERY      There were no vitals filed for this visit.    Subjective Assessment - 12/13/19 0634    Subjective Pt rates pain a 5/10, due to rainy weather. Usually the pain has been around a 3 which is a little more than her baseline pain prior to most recent injury.    Currently in Pain? Yes    Pain Score 5     Pain Location Back    Pain Orientation Posterior;Lower    Pain Descriptors / Indicators Aching    Pain Onset More than a month ago    Pain Frequency Intermittent              [facility] PT Assessment - 12/13/19 0001      Observation/Other Assessments   Focus on Therapeutic Outcomes (FOTO)  33%      AROM   Lumbar Flexion full    Lumbar Extension full    Lumbar - Right Side Bend 3 inch past jt. line    Lumbar - Left Side Bend 3  inch past jt line    Lumbar - Right Rotation full    Lumbar - Left Rotation full                       Objective measurements completed on examination: See above findings.       [facility] Adult PT Treatment/Exercise - 12/13/19 0001      Self-Care   Self-Care Other Self-Care Comments    Other Self-Care Comments  HEP for cervical and scapular retractions      Exercises   Exercises Lumbar      Lumbar Exercises: Stretches   Lower Trunk Rotation 3 reps;10 seconds    Piriformis Stretch 3 reps;2 reps;20 seconds      Lumbar Exercises: Aerobic   Nustep L6 x 5 min with UE and LE      Lumbar Exercises: Standing   Lifting 10 reps    Lifting Limitations Deadlift with 30#    Other Standing Lumbar Exercises Pallof press with blue band 10x each side with 5 sec hold    Other Standing Lumbar Exercises Farmers carry unilateral 25# x2 lengths each      Lumbar Exercises:  Supine   Bent Knee Raise 10 reps    Bent Knee Raise Limitations 2 sets      Lumbar Exercises: Quadruped   Opposite Arm/Leg Raise 10 reps;3 seconds    Opposite Arm/Leg Raise Limitations elbow to knee    Other Quadruped Lumbar Exercises Thread the needle with ceiling reach 2x5 each side                  PT Education - 12/13/19 0646    Education Details Cervical retractions supine, cervical retractions c upper cervical side bends, scapular retractions were provided for 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    Person(s) Educated Patient    Methods Explanation;Demonstration;Tactile cues;Verbal cues;Handout    Comprehension Verbalized understanding;Returned demonstration            PT Short Term Goals - 11/22/19 1731      PT SHORT TERM GOAL #1   Title Pt will be ind c an initial HEP    Time 3    Period Weeks    Status Achieved    Target Date 11/18/19      PT SHORT TERM GOAL #2   Title Pt will report a pain range of 0-5 with daily activities    Time 3    Period Weeks    Status Achieved    Target Date 11/18/19             PT Long Term Goals - 12/13/19  0647      PT LONG TERM GOAL #1   Title Back motion of L SB and ext will demonstrate improved ROM with L SB to 35d and Ext full ROM. Met    Baseline L SB 28d, ext 50% limited    Target Date 12/16/19      PT LONG TERM GOAL #2   Title Pt will report an improved low back pain range of 0-3 with sleeping and daily activities. Ongoing 0-5/10    Baseline 4-7/10    Status On-going    Target Date 12/16/19      PT LONG TERM GOAL #3   Title Pt will demonstrate understanding of proper posture and body mechanics for the reduction of pain and prevention of future injuries. Met-as demonstrated with lifting activities    Status Achieved    Target Date 12/16/19      PT LONG TERM GOAL #4   Title Pt will be Ind in a final HEP. On-going    Status On-going    Target Date 12/16/19      PT LONG TERM GOAL #5   Title Pt's FOTO score will improve to a 29% limitation. On-going, 33% limitation    Baseline Initial score 50% limitation    Status On-going    Target Date 12/16/19                  Plan - 12/13/19 0707    Clinical Impression Statement Re-assessment found pt making appropriate progress. Pt has met trunk ROM goals, and is progressing with other goals. Pt's pain and FOTO score are minimally less than the sets goals. With pt reporting neck and upper back discomfort and fatigue associated with compurt work, instruction and and exs were completed for cervical retractions c and s min. upper cervical SB in supine and scapular retractions in sitting. Exs were addedd to pt's HEP. Pt will continue to benefit from skilled PT to met goals for pain reduction and functional improvement.    Personal Factors  and Comorbidities Past/Current Experience    Examination-Activity Limitations Carry;Sit;Sleep;Locomotion Level;Transfers;Squat;Stairs;Stand;Bed Mobility    Stability/Clinical Decision Making Evolving/Moderate complexity    Rehab Potential Good    PT Treatment/Interventions Cryotherapy;Electrical  Stimulation;Ultrasound;Traction;Moist Heat;Iontophoresis 71m/ml Dexamethasone;Gait training;Stair training;Functional mobility training;Therapeutic activities;Therapeutic exercise;Neuromuscular re-education;Manual techniques;Patient/family education;Passive range of motion;Dry needling;Spinal Manipulations;Taping    PT Next Visit Plan Continue PT to address core strength, lumbar ROM, and pain management.    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 Low back pain 11/18/2019  . Acute sinus infection 07/12/2018  . Viral illness 07/08/2018  . Mild intermittent asthma without complication 091/66/0600 . Nonallopathic lesion of rib cage 01/28/2018  . Nonallopathic lesion of sacral region 12/24/2017  . ETD (Eustachian tube dysfunction), left 07/10/2017  . Greater trochanteric bursitis, right 06/25/2017  . Migraine headache 02/17/2017  . Intercostal pain 02/03/2017  . Polyarthralgia 08/23/2015  . Asthma with acute exacerbation 02/28/2015  . Quadriceps tendinitis 11/16/2014  . Neck pain 07/07/2014  . Concussion with no loss of consciousness 07/07/2014  . Nonallopathic lesion of lumbosacral region 11/04/2013  . Nonallopathic lesion of thoracic region  11/04/2013  . Piriformis syndrome of left side 10/14/2013  . Greater trochanteric bursitis of left hip 10/14/2013  . Trapezius muscle spasm 11/26/2012  . Nonallopathic lesion of cervicothoracic region 11/26/2012  . Seasonal allergies 09/28/2012  . Asthma attack 12/01/2011  . Backache 09/26/2009  . OTHER ACQUIRED DEFORMITY OF ANKLE AND FOOT OTHER 09/26/2009  . ABNORMALITY OF GAIT 09/26/2009  . KNEE PAIN, RIGHT 08/08/2009  . TENDINITIS, PATELLAR 08/08/2009    AGar PontoMS, PT 12/13/19 7:11 AM  [facility]GTwain NAl[place] [address]Phone: [phone]  Fax:  [phone] Name: MTAYTUM SCHECKMRN: [id number] of Birth: [birth year]

## 2019-12-15 ENCOUNTER — Encounter: Payer: Self-pay | Admitting: Physical Therapy

## 2019-12-15 ENCOUNTER — Ambulatory Visit: Payer: 59 | Admitting: Physical Therapy

## 2019-12-15 ENCOUNTER — Other Ambulatory Visit: Payer: Self-pay

## 2019-12-15 DIAGNOSIS — M545 Low back pain, unspecified: Secondary | ICD-10-CM

## 2019-12-15 DIAGNOSIS — M5386 Other specified dorsopathies, lumbar region: Secondary | ICD-10-CM | POA: Diagnosis not present

## 2019-12-15 DIAGNOSIS — R2689 Other abnormalities of gait and mobility: Secondary | ICD-10-CM | POA: Diagnosis not present

## 2019-12-15 NOTE — Therapy (Signed)
[place], [place], [address] Phone: [phone]   Fax:  [phone]  Physical Therapy Treatment / Discharge  Patient Details  Name: Melanie [name] MRN: [id number] Date of Birth: [birth year] Referring Provider (PT): Gregor Hams, MD   Encounter Date: 12/15/2019   PT End of Session - 12/15/19 1601    Visit Number 11    Number of Visits 13    Date for PT Re-Evaluation 01/27/20    Authorization Type [place] [employer]    Progress Note Due on Visit 10    PT Start Time 1605    PT Stop Time 1645    PT Time Calculation (min) 40 min    Activity Tolerance Patient tolerated treatment well    Behavior During Therapy [facility] for tasks assessed/performed           Past Medical History:  Diagnosis Date  . Allergy   . Asthma   . Osteoporosis     Past Surgical History:  Procedure Laterality Date  . ABDOMINAL HYSTERECTOMY    . APPENDECTOMY    . KNEE SURGERY      There were no vitals filed for this visit.   Subjective Assessment - 12/15/19 1600    Subjective Patient reports she doesn't have any pain today. Exercises are going well. She feels she is ready to be discharged with her current exercise program.    Patient Stated Goals Less pain, more mobility, back to my regular activiites    Currently in Pain? No/denies    Pain Score 0-No pain    Pain Location Back    Pain Orientation Lower    Pain Descriptors / Indicators --    Pain Type Chronic pain    Pain Onset More than a month ago    Pain Frequency Intermittent              [facility] PT Assessment - 12/15/19 0001      Assessment   Medical Diagnosis Lumbosacral strain    Referring Provider (PT) Gregor Hams, MD      Precautions   Precautions None      Restrictions   Weight Bearing Restrictions No      Balance Screen   Has the patient fallen in the past 6 months No      Prior Function   Level of Independence Independent    Vocation Full time  employment      Observation/Other Assessments   Focus on Therapeutic Outcomes (FOTO)  17% limitation      AROM   Overall AROM Comments Patient exhibits full lumbar AROM                         [facility] Adult PT Treatment/Exercise - 12/15/19 0001      Exercises   Exercises Lumbar      Lumbar Exercises: Stretches   Lower Trunk Rotation 5 reps    Lower Trunk Rotation Limitations feet elevated      Lumbar Exercises: Aerobic   Nustep L6 x 5 min with UE and LE      Lumbar Exercises: Standing   Lifting 10 reps   2 sets   Lifting Limitations Deadlift with 30#    Other Standing Lumbar Exercises Pallof press with blue band 10x each side with 5 sec hold    Other Standing Lumbar Exercises Farmers carry unilateral 25# x2 lengths each      Lumbar Exercises: Supine  Dead Bug 10 reps      Lumbar Exercises: Quadruped   Madcat/Old Horse 10 reps    Opposite Arm/Leg Raise 10 reps;3 seconds    Other Quadruped Lumbar Exercises Thread the needle with ceiling reach 2x5 each side                  PT Education - 12/15/19 1601    Education Details HEP    Person(s) Educated Patient    Methods Explanation;Demonstration    Comprehension Verbalized understanding;Returned demonstration            PT Short Term Goals - 11/22/19 1731      PT SHORT TERM GOAL #1   Title Pt will be ind c an initial HEP    Time 3    Period Weeks    Status Achieved    Target Date 11/18/19      PT SHORT TERM GOAL #2   Title Pt will report a pain range of 0-5 with daily activities    Time 3    Period Weeks    Status Achieved    Target Date 11/18/19             PT Long Term Goals - 12/15/19 1616      PT LONG TERM GOAL #1   Title Back motion of L SB and ext will demonstrate improved ROM with L SB to 35d and Ext full ROM.    Baseline Patient exhibits full lumbar AROM    Status Achieved      PT LONG TERM GOAL #2   Title Pt will report an improved low back pain range of 0-3 with  sleeping and daily activities.    Baseline Reports 0/10 today    Status Achieved      PT LONG TERM GOAL #3   Title Pt will demonstrate understanding of proper posture and body mechanics for the reduction of pain and prevention of future injuries.    Baseline Patient exhibits proper posture and lifting form    Status Achieved      PT LONG TERM GOAL #4   Title Pt will be Ind in a final HEP.    Baseline Patient exhibits independence with HEP    Status Achieved      PT LONG TERM GOAL #5   Title Pt's FOTO score will improve to a 29% limitation. On-going, 33% limitation    Baseline 17% limitation    Status Achieved                 Plan - 12/15/19 1602    Clinical Impression Statement Patient has achieved all established goals and is independent with her HEP. She will be discharged from formal PT.    PT Treatment/Interventions Cryotherapy;Electrical Stimulation;Ultrasound;Traction;Moist Heat;Iontophoresis 44m/ml Dexamethasone;Gait training;Stair training;Functional mobility training;Therapeutic activities;Therapeutic exercise;Neuromuscular re-education;Manual techniques;Patient/family education;Passive range of motion;Dry needling;Spinal Manipulations;Taping    PT Next Visit Plan NA - discharge    PT [place] Agree with Plan of Care Patient              Visit Diagnosis: Acute left-sided low back pain without sciatica  Decreased ROM of lumbar spine  Other abnormalities of gait and mobility     Problem List Patient Active Problem List   Diagnosis Date Noted  . Low back pain 11/18/2019  . Acute sinus infection 07/12/2018  . Viral illness 07/08/2018  . Mild intermittent asthma without complication 071/21/9758 . Nonallopathic lesion of  rib cage 01/28/2018  . Nonallopathic lesion of sacral region 12/24/2017  . ETD (Eustachian tube dysfunction), left 07/10/2017  . Greater trochanteric bursitis, right 06/25/2017  . Migraine headache  02/17/2017  . Intercostal pain 02/03/2017  . Polyarthralgia 08/23/2015  . Asthma with acute exacerbation 02/28/2015  . Quadriceps tendinitis 11/16/2014  . Neck pain 07/07/2014  . Concussion with no loss of consciousness 07/07/2014  . Nonallopathic lesion of lumbosacral region 11/04/2013  . Nonallopathic lesion of thoracic region 11/04/2013  . Piriformis syndrome of left side 10/14/2013  . Greater trochanteric bursitis of left hip 10/14/2013  . Trapezius muscle spasm 11/26/2012  . Nonallopathic lesion of cervicothoracic region 11/26/2012  . Seasonal allergies 09/28/2012  . Asthma attack 12/01/2011  . Backache 09/26/2009  . OTHER ACQUIRED DEFORMITY OF ANKLE AND FOOT OTHER 09/26/2009  . ABNORMALITY OF GAIT 09/26/2009  . KNEE PAIN, RIGHT 08/08/2009  . TENDINITIS, PATELLAR 08/08/2009     PHYSICAL THERAPY DISCHARGE SUMMARY  Visits from Start of Care: 11  Current functional level related to goals / functional outcomes: See above   Remaining deficits: See above   Education / Equipment: HEP  Plan: Patient agrees to discharge.  Patient goals were met. Patient is being discharged due to meeting the stated rehab goals.  ?????     Hilda Blades, PT, DPT, LAT, ATC 12/15/19  4:42 PM Phone: [phone] Fax: [facility] [address] [place], [place], [address] Phone: [phone]   Fax:  [phone]  Name: LATONDA LARRIVEE MRN: [id number] Date of Birth: Sep 04, 1960

## 2019-12-15 NOTE — Patient Instructions (Signed)
Access Code: [facility] URL: [email or web address] Date: 12/15/2019 Prepared by: Rosana Hoes  Exercises Prone Press Up - 4 x daily - 1 sets - 10 reps Supine Bridge with Pelvic Floor Contraction on Swiss Ball - 1 x daily - 3 sets - 8-10 reps - 1-2 seconds hold Quadruped Full Range Thoracic Rotation with Reach - 1 x daily - 10 reps Cat-Camel - 1 x daily - 10 reps Bird Dog - 1 x daily - 3 sets - 10 reps Standing Lumbar Extension - 4 x daily - 3 sets - 10 reps - 3 hold Supine Dead Bug with Leg Extension - 1 x daily - 10 reps - 3 sets 90/90 Lower Trunk Rotation - 1 x daily - 3 sets - 10 reps Half Kneeling Hip Flexor Stretch - 1 x daily - 3 reps - 20 seconds hold Half Dead Lift with Kettlebell - 1 x daily - 3 sets - 10 reps Farmer's Carry with Kettlebells - 1 x daily - 3 reps - 30 seconds hold Standing Anti-Rotation Press with Anchored Resistance - 1 x daily - 3 sets - 5 reps - 5 seconds hold Supine Cervical Retraction with Towel - 2-3 x daily - 7 x weekly - 1 sets - 10 reps Seated Scapular Retraction - 6 x daily - 7 x weekly - 1 sets - 5-10 reps - 5 sec hold

## 2019-12-21 ENCOUNTER — Other Ambulatory Visit: Payer: Self-pay

## 2019-12-21 ENCOUNTER — Encounter: Payer: Self-pay | Admitting: Internal Medicine

## 2019-12-21 ENCOUNTER — Ambulatory Visit: Payer: 59 | Admitting: Internal Medicine

## 2019-12-21 VITALS — BP 154/90 | HR 79 | Temp 98.3°F | Resp 16 | Ht 62.0 in | Wt 166.4 lb

## 2019-12-21 DIAGNOSIS — T6591XA Toxic effect of unspecified substance, accidental (unintentional), initial encounter: Secondary | ICD-10-CM | POA: Insufficient documentation

## 2019-12-21 MED ORDER — METHYLPREDNISOLONE ACETATE 80 MG/ML IJ SUSP
120.0000 mg | Freq: Once | INTRAMUSCULAR | Status: AC
  Administered 2019-12-21: 120 mg via INTRAMUSCULAR

## 2019-12-21 MED ORDER — METHYLPREDNISOLONE ACETATE 80 MG/ML IJ SUSP: 80.0000 mg | Freq: Once | INTRAMUSCULAR | Status: DC

## 2019-12-21 MED ORDER — DESONIDE 0.05 % EX LOTN: TOPICAL_LOTION | Freq: Two times a day (BID) | CUTANEOUS | 0 refills | Status: DC

## 2019-12-21 MED ORDER — LEVOCETIRIZINE DIHYDROCHLORIDE 5 MG PO TABS: 5.0000 mg | ORAL_TABLET | Freq: Every evening | ORAL | 1 refills | Status: DC

## 2019-12-21 MED FILL — LEVOCETIRIZINE 5 MG TABLET: 5 | 90 days supply | Qty: 90 | Fill #0

## 2019-12-21 MED FILL — DESONIDE 0.05% LOTION: 0.05 | 21 days supply | Qty: 118 | Fill #0

## 2019-12-21 NOTE — Patient Instructions (Signed)
Contact Dermatitis Dermatitis is redness, soreness, and swelling (inflammation) of the skin. Contact dermatitis is a reaction to certain substances that touch the skin. Many different substances can cause contact dermatitis. There are two types of contact dermatitis:  Irritant contact dermatitis. This type is caused by something that irritates your skin, such as having dry hands from washing them too often with soap. This type does not require previous exposure to the substance for a reaction to occur. This is the most common type.  Allergic contact dermatitis. This type is caused by a substance that you are allergic to, such as poison ivy. This type occurs when you have been exposed to the substance (allergen) and develop a sensitivity to it. Dermatitis may develop soon after your first exposure to the allergen, or it may not develop until the next time you are exposed and every time thereafter. What are the causes? Irritant contact dermatitis is most commonly caused by exposure to:  Makeup.  Soaps.  Detergents.  Bleaches.  Acids.  Metal salts, such as nickel. Allergic contact dermatitis is most commonly caused by exposure to:  Poisonous plants.  Chemicals.  Jewelry.  Latex.  Medicines.  Preservatives in products, such as clothing. What increases the risk? You are more likely to develop this condition if you have:  A job that exposes you to irritants or allergens.  Certain medical conditions, such as asthma or eczema. What are the signs or symptoms? Symptoms of this condition may occur on your body anywhere the irritant has touched you or is touched by you.  Symptoms include: ? Dryness or flaking. ? Redness. ? Cracks. ? Itching. ? Pain or a burning feeling. ? Blisters. ? Drainage of small amounts of blood or clear fluid from skin cracks. With allergic contact dermatitis, there may also be swelling in areas such as the eyelids, mouth, or genitals. How is this  diagnosed? This condition is diagnosed with a medical history and physical exam.  A patch skin test may be performed to help determine the cause.  If the condition is related to your job, you may need to see an occupational medicine specialist. How is this treated? This condition is treated by checking for the cause of the reaction and protecting your skin from further contact. Treatment may also include:  Steroid creams or ointments. Oral steroid medicines may be needed in more severe cases.  Antibiotic medicines or antibacterial ointments, if a skin infection is present.  Antihistamine lotion or an antihistamine taken by mouth to ease itching.  A bandage (dressing). Follow these instructions at home: Skin care  Moisturize your skin as needed.  Apply cool compresses to the affected areas.  Try applying baking soda paste to your skin. Stir water into baking soda until it reaches a paste-like consistency.  Do not scratch your skin, and avoid friction to the affected area.  Avoid the use of soaps, perfumes, and dyes. Medicines  Take or apply over-the-counter and prescription medicines only as told by your health care provider.  If you were prescribed an antibiotic medicine, take or apply the antibiotic as told by your health care provider. Do not stop using the antibiotic even if your condition improves. Bathing  Try taking a bath with: ? Epsom salts. Follow the instructions on the packaging. You can get these at your local pharmacy or grocery store. ? Baking soda. Pour a small amount into the bath as directed by your health care provider. ? Colloidal oatmeal. Follow the instructions on the   packaging. You can get this at your local pharmacy or grocery store.  Bathe less frequently, such as every other day.  Bathe in lukewarm water. Avoid using hot water. Bandage care  If you were given a bandage (dressing), change it as told by your health care provider.  Wash your hands  with soap and water before and after you change your dressing. If soap and water are not available, use hand sanitizer. General instructions  Avoid the substance that caused your reaction. If you do not know what caused it, keep a journal to try to track what caused it. Write down: ? What you eat. ? What cosmetic products you use. ? What you drink. ? What you wear in the affected area. This includes jewelry.  Check the affected areas every day for signs of infection. Check for: ? More redness, swelling, or pain. ? More fluid or blood. ? Warmth. ? Pus or a bad smell.  Keep all follow-up visits as told by your health care provider. This is important. Contact a health care provider if:  Your condition does not improve with treatment.  Your condition gets worse.  You have signs of infection such as swelling, tenderness, redness, soreness, or warmth in the affected area.  You have a fever.  You have new symptoms. Get help right away if:  You have a severe headache, neck pain, or neck stiffness.  You vomit.  You feel very sleepy.  You notice red streaks coming from the affected area.  Your bone or joint underneath the affected area becomes painful after the skin has healed.  The affected area turns darker.  You have difficulty breathing. Summary  Dermatitis is redness, soreness, and swelling (inflammation) of the skin. Contact dermatitis is a reaction to certain substances that touch the skin.  Symptoms of this condition may occur on your body anywhere the irritant has touched you or is touched by you.  This condition is treated by figuring out what caused the reaction and protecting your skin from further contact. Treatment may also include medicines and skin care.  Avoid the substance that caused your reaction. If you do not know what caused it, keep a journal to try to track what caused it.  Contact a health care provider if your condition gets worse or you have signs  of infection such as swelling, tenderness, redness, soreness, or warmth in the affected area. This information is not intended to replace advice given to you by your health care provider. Make sure you discuss any questions you have with your health care provider. Document Revised: 09/15/2018 Document Reviewed: 12/09/2017 Elsevier Patient Education  2020 [employer]  

## 2019-12-21 NOTE — Progress Notes (Signed)
Subjective:  Patient ID: Melanie [name], female    DOB: 19-Apr-1961  Age: 59 y.o. MRN: [id number]  CC: Facial Swelling  This visit occurred during the SARS-CoV-2 public health emergency.  Safety protocols were in place, including screening questions prior to the visit, additional usage of staff PPE, and extensive cleaning of exam room while observing appropriate contact time as indicated for disinfecting solutions.    HPI Melanie [name] presents for the complaint of a several day history of swelling and itching across her face.  She applies multiple agents to her scalp hair and face including flaxseed oils and gels.  She is also using several different Oil of Olay products.  She took Benadryl and says the symptoms improved.  Outpatient Medications Prior to Visit  Medication Sig Dispense Refill  . albuterol (PROAIR HFA) 108 (90 Base) MCG/ACT inhaler Inhale 1 puff into the lungs every 6 (six) hours as needed for wheezing or shortness of breath. 1 Inhaler 5  . azelastine (ASTELIN) 0.1 % nasal spray Place 1 spray into both nostrils 2 (two) times daily. 30 mL 5  . cholecalciferol (VITAMIN D) 1000 units tablet Take 2,000 Units by mouth daily.    . Flaxseed, Linseed, (FLAX SEED OIL) 1000 MG CAPS Take 1,000 mg by mouth.    . fluticasone (FLONASE) 50 MCG/ACT nasal spray Place 2 sprays into both nostrils daily. 16 g 6  . fluticasone (FLOVENT HFA) 110 MCG/ACT inhaler Inhale 1 puff into the lungs 2 (two) times daily. 1 Inhaler 12  . ipratropium (ATROVENT) 0.03 % nasal spray Place 2 sprays into both nostrils every 12 (twelve) hours. 30 mL 12  . Multiple Vitamin (MULTIVITAMIN) tablet Take 1 tablet by mouth daily.    . Probiotic Product (PROBIOTIC DAILY PO) Take by mouth.    . rizatriptan (MAXALT-MLT) 10 MG disintegrating tablet Take 1 tablet (10 mg total) by mouth as needed for migraine. May repeat in 2 hours if needed 10 tablet 1  . cyclobenzaprine (FLEXERIL) 5 MG tablet Take 1-2 tablets (5-10 mg  total) by mouth at bedtime as needed for muscle spasms. 30 tablet 0  . HYDROcodone-acetaminophen (NORCO/VICODIN) 5-325 MG tablet Take 1 tablet by mouth every 6 (six) hours as needed. 15 tablet 0  . loratadine (CLARITIN) 10 MG tablet Take 10 mg by mouth daily.    . benzonatate (TESSALON PERLES) 100 MG capsule Take 1 capsule (100 mg total) by mouth 3 (three) times daily as needed. 20 capsule 0   No facility-administered medications prior to visit.    ROS Review of Systems  Constitutional: Negative.  Negative for chills, diaphoresis, fatigue and fever.  HENT: Positive for facial swelling. Negative for postnasal drip, rhinorrhea, sinus pressure, sore throat, trouble swallowing and voice change.   Eyes: Negative.   Respiratory: Negative.  Negative for cough, chest tightness, shortness of breath and wheezing.   Cardiovascular: Negative for chest pain, palpitations and leg swelling.  Gastrointestinal: Negative for abdominal pain, diarrhea, nausea and vomiting.  Genitourinary: Negative.  Negative for difficulty urinating and dysuria.  Musculoskeletal: Negative.  Negative for back pain, myalgias and neck pain.  Skin: Positive for rash. Negative for color change.  Neurological: Negative.  Negative for dizziness, weakness, light-headedness and headaches.  Hematological: Negative for adenopathy. Does not bruise/bleed easily.  Psychiatric/Behavioral: Negative.     Objective:  BP (!) 154/90 (BP Location: Left Arm, Patient Position: Sitting, Cuff Size: Normal)   Pulse 79   Temp 98.3 F (36.8 C) (Oral)   Resp  16   Ht 5\' 2"  (1.575 m)   Wt 166 lb 6 oz (75.5 kg)   SpO2 97%   BMI 30.43 kg/m   BP Readings from Last 3 Encounters:  12/21/19 (!) 154/90  12/07/19 118/84  11/18/19 130/86    Wt Readings from Last 3 Encounters:  12/21/19 166 lb 6 oz (75.5 kg)  11/18/19 165 lb (74.8 kg)  10/18/19 161 lb 9.6 oz (73.3 kg)    Physical Exam Vitals reviewed.  Constitutional:      General: She is not  in acute distress.    Appearance: Normal appearance. She is not toxic-appearing or diaphoretic.  HENT:     Nose: Nose normal.     Mouth/Throat:     Lips: Pink.     Mouth: Mucous membranes are moist.     Tongue: No lesions.     Pharynx: Oropharynx is clear. No pharyngeal swelling, posterior oropharyngeal erythema or uvula swelling.  Eyes:     General: No scleral icterus. Cardiovascular:     Rate and Rhythm: Normal rate and regular rhythm.     Heart sounds: No murmur heard.   Pulmonary:     Effort: Pulmonary effort is normal.     Breath sounds: No stridor. No wheezing, rhonchi or rales.  Abdominal:     General: Abdomen is flat.     Palpations: There is no mass.     Tenderness: There is no abdominal tenderness.  Musculoskeletal:        General: Normal range of motion.     Cervical back: Neck supple.     Right lower leg: No edema.     Left lower leg: No edema.  Lymphadenopathy:     Cervical: No cervical adenopathy.  Skin:    General: Skin is warm and dry.     Findings: Erythema and rash present.     Comments: There is mild swelling and erythema across the face symmetrically and diffusely.  There are no discrete epidermal lesions such as vesicles or excoriations.  There is no involvement of the conjunctive I, mucous membranes, or over her posterior pharynx.  Neurological:     General: No focal deficit present.     Mental Status: She is alert.  Psychiatric:        Mood and Affect: Mood normal.        Behavior: Behavior normal.     Lab Results  Component Value Date   WBC 3.9 (L) 04/10/2017   HGB 11.9 (L) 04/10/2017   HCT 35.9 (L) 04/10/2017   PLT 165.0 04/10/2017   GLUCOSE 95 04/10/2017   CHOL 187 04/10/2017   TRIG 46.0 04/10/2017   HDL 62.90 04/10/2017   LDLCALC 115 (H) 04/10/2017   ALT 48 (H) 04/10/2017   AST 29 04/10/2017   NA 140 04/10/2017   K 3.6 04/10/2017   CL 106 04/10/2017   CREATININE 0.76 04/10/2017   BUN 12 04/10/2017   CO2 28 04/10/2017   TSH 1.11  04/10/2017    MM DIGITAL SCREENING BILATERAL  Result Date: 05/26/2017 CLINICAL DATA:  Screening. EXAM: DIGITAL SCREENING BILATERAL MAMMOGRAM WITH CAD COMPARISON:  Previous exam(s). ACR Breast Density Category b: There are scattered areas of fibroglandular density. FINDINGS: There are no findings suspicious for malignancy. Images were processed with CAD. IMPRESSION: No mammographic evidence of malignancy. A result letter of this screening mammogram will be mailed directly to the patient. RECOMMENDATION: Screening mammogram in one year. (Code:SM-B-01Y) BI-RADS CATEGORY  1: Negative. Electronically Signed  By: Hulan Saas M.D.   On: 05/26/2017 09:56    Assessment & Plan:   Tamkia was seen today for facial swelling.  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  Will treat the allergic reaction with levocetirizine, desonide lotion, and an injection of methylprednisolone. -     levocetirizine (XYZAL) 5 MG tablet; Take 1 tablet (5 mg total) by mouth every evening. -     desonide (DESOWEN) 0.05 % lotion; Apply topically 2 (two) times daily. -     Discontinue: methylPREDNISolone acetate (DEPO-MEDROL) injection 80 mg -     methylPREDNISolone acetate (DEPO-MEDROL) injection 120 mg   I have discontinued Katie D. Mcgue's loratadine, benzonatate, cyclobenzaprine, and HYDROcodone-acetaminophen. I am also having her start on levocetirizine and desonide. Additionally, I am having her maintain her multivitamin, cholecalciferol, Probiotic Product (PROBIOTIC DAILY PO), ipratropium, Flax Seed Oil, azelastine, rizatriptan, fluticasone, albuterol, and fluticasone. We administered methylPREDNISolone acetate.  Meds ordered this encounter  Medications  . levocetirizine (XYZAL) 5 MG tablet    Sig: Take 1 tablet (5 mg total) by mouth  every evening.    Dispense:  90 tablet    Refill:  1  . desonide (DESOWEN) 0.05 % lotion    Sig: Apply topically 2 (two) times daily.    Dispense:  118 mL    Refill:  0  . DISCONTD: methylPREDNISolone acetate (DEPO-MEDROL) injection 80 mg  . methylPREDNISolone acetate (DEPO-MEDROL) injection 120 mg     Follow-up: Return in about 3 weeks (around 01/11/2020).  Sanda Linger, MD

## 2019-12-22 ENCOUNTER — Telehealth: Payer: Self-pay

## 2019-12-22 NOTE — Telephone Encounter (Signed)
New message   Seen on 7.14.2021    Patient itching asking can the CMA call her to discuss options or allergic testing.

## 2019-12-23 NOTE — Telephone Encounter (Signed)
LVM for pt to call back as soon as possible.   

## 2020-01-04 ENCOUNTER — Telehealth: Payer: Self-pay

## 2020-01-04 NOTE — Telephone Encounter (Signed)
-----   Message from [facility], [place] sent at 12/21/2019  4:03 PM EDT ----- Regarding: COVID Vaccine Send mychart about dates of COVID vaccine

## 2020-01-11 ENCOUNTER — Other Ambulatory Visit: Payer: Self-pay

## 2020-01-11 ENCOUNTER — Encounter: Payer: Self-pay | Admitting: Internal Medicine

## 2020-01-11 ENCOUNTER — Ambulatory Visit: Payer: 59 | Admitting: Internal Medicine

## 2020-01-11 VITALS — BP 130/80 | HR 73 | Temp 97.9°F | Resp 16 | Ht 62.0 in | Wt 163.0 lb

## 2020-01-11 DIAGNOSIS — J4520 Mild intermittent asthma, uncomplicated: Secondary | ICD-10-CM

## 2020-01-11 MED ORDER — FLOVENT HFA 110 MCG/ACT IN AERO: 1.0000 | INHALATION_SPRAY | Freq: Two times a day (BID) | RESPIRATORY_TRACT | 1 refills | Status: DC

## 2020-01-11 NOTE — Progress Notes (Signed)
Subjective:  Patient ID: Melanie [name], female    DOB: Aug 06, 1960  Age: 59 y.o. MRN: [id number]  CC: Asthma  This visit occurred during the SARS-CoV-2 public health emergency.  Safety protocols were in place, including screening questions prior to the visit, additional usage of staff PPE, and extensive cleaning of exam room while observing appropriate contact time as indicated for disinfecting solutions.    HPI Melanie [name] presents for f/up - She stopped using the topical applications to her face and scalp and the facial rash and itching has resolved. She has multiple allergies and has intermittent wheezing. She was previously prescribed an ICS but has not been using it.  Outpatient Medications Prior to Visit  Medication Sig Dispense Refill  . albuterol (PROAIR HFA) 108 (90 Base) MCG/ACT inhaler Inhale 1 puff into the lungs every 6 (six) hours as needed for wheezing or shortness of breath. 1 Inhaler 5  . cholecalciferol (VITAMIN D) 1000 units tablet Take 2,000 Units by mouth daily.    . fluticasone (FLONASE) 50 MCG/ACT nasal spray Place 2 sprays into both nostrils daily. 16 g 6  . Multiple Vitamin (MULTIVITAMIN) tablet Take 1 tablet by mouth daily.    . Probiotic Product (PROBIOTIC DAILY PO) Take by mouth.    . rizatriptan (MAXALT-MLT) 10 MG disintegrating tablet Take 1 tablet (10 mg total) by mouth as needed for migraine. May repeat in 2 hours if needed 10 tablet 1  . desonide (DESOWEN) 0.05 % lotion Apply topically 2 (two) times daily. 118 mL 0  . fluticasone (FLOVENT HFA) 110 MCG/ACT inhaler Inhale 1 puff into the lungs 2 (two) times daily. 1 Inhaler 12  . azelastine (ASTELIN) 0.1 % nasal spray Place 1 spray into both nostrils 2 (two) times daily. 30 mL 5  . Flaxseed, Linseed, (FLAX SEED OIL) 1000 MG CAPS Take 1,000 mg by mouth.    [name] ipratropium (ATROVENT) 0.03 % nasal spray Place 2 sprays into both nostrils every 12 (twelve) hours. 30 mL 12  . levocetirizine (XYZAL) 5 MG tablet  Take 1 tablet (5 mg total) by mouth every evening. 90 tablet 1   No facility-administered medications prior to visit.    ROS Review of Systems  Constitutional: Negative.  Negative for chills, diaphoresis, fatigue and fever.  HENT: Negative.  Negative for facial swelling and sinus pressure.   Eyes: Negative for visual disturbance.  Respiratory: Positive for wheezing. Negative for cough, chest tightness and shortness of breath.   Cardiovascular: Negative for chest pain, palpitations and leg swelling.  Gastrointestinal: Negative for abdominal pain, constipation, diarrhea, nausea and vomiting.  Endocrine: Negative.   Genitourinary: Negative.  Negative for difficulty urinating.  Musculoskeletal: Negative.  Negative for arthralgias and myalgias.  Skin: Negative.  Negative for color change.  Neurological: Negative.  Negative for dizziness, weakness and light-headedness.  Hematological: Negative for adenopathy. Does not bruise/bleed easily.  Psychiatric/Behavioral: Negative.     Objective:  BP 130/80 (BP Location: Left Arm, Patient Position: Sitting, Cuff Size: Normal)   Pulse 73   Temp 97.9 F (36.6 C) (Oral)   Resp 16   Ht 5\' 2"  (1.575 m)   Wt 163 lb (73.9 kg)   SpO2 97%   BMI 29.81 kg/m   BP Readings from Last 3 Encounters:  01/11/20 130/80  12/21/19 (!) 154/90  12/07/19 118/84    Wt Readings from Last 3 Encounters:  01/11/20 163 lb (73.9 kg)  12/21/19 166 lb 6 oz (75.5 kg)  11/18/19 165 lb (74.8  kg)    Physical Exam Vitals reviewed.  Constitutional:      General: She is not in acute distress.    Appearance: Normal appearance. She is not ill-appearing, toxic-appearing or diaphoretic.  HENT:     Nose: Nose normal.     Mouth/Throat:     Mouth: Mucous membranes are moist.     Pharynx: No oropharyngeal exudate.  Eyes:     General: No scleral icterus.    Conjunctiva/sclera: Conjunctivae normal.  Cardiovascular:     Rate and Rhythm: Normal rate and regular rhythm.      Heart sounds: No murmur heard.   Pulmonary:     Effort: Pulmonary effort is normal.     Breath sounds: No stridor. No wheezing, rhonchi or rales.  Abdominal:     General: Abdomen is flat.     Palpations: There is no mass.     Tenderness: There is no abdominal tenderness. There is no guarding.  Musculoskeletal:        General: Normal range of motion.     Cervical back: Neck supple.     Right lower leg: No edema.     Left lower leg: No edema.  Lymphadenopathy:     Cervical: No cervical adenopathy.  Skin:    General: Skin is warm and dry.     Coloration: Skin is not pale.  Neurological:     General: No focal deficit present.     Mental Status: She is alert.  Psychiatric:        Mood and Affect: Mood normal.     Lab Results  Component Value Date   WBC 3.9 (L) 04/10/2017   HGB 11.9 (L) 04/10/2017   HCT 35.9 (L) 04/10/2017   PLT 165.0 04/10/2017   GLUCOSE 95 04/10/2017   CHOL 187 04/10/2017   TRIG 46.0 04/10/2017   HDL 62.90 04/10/2017   LDLCALC 115 (H) 04/10/2017   ALT 48 (H) 04/10/2017   AST 29 04/10/2017   NA 140 04/10/2017   K 3.6 04/10/2017   CL 106 04/10/2017   CREATININE 0.76 04/10/2017   BUN 12 04/10/2017   CO2 28 04/10/2017   TSH 1.11 04/10/2017    MM DIGITAL SCREENING BILATERAL  Result Date: 05/26/2017 CLINICAL DATA:  Screening. EXAM: DIGITAL SCREENING BILATERAL MAMMOGRAM WITH CAD COMPARISON:  Previous exam(s). ACR Breast Density Category b: There are scattered areas of fibroglandular density. FINDINGS: There are no findings suspicious for malignancy. Images were processed with CAD. IMPRESSION: No mammographic evidence of malignancy. A result letter of this screening mammogram will be mailed directly to the patient. RECOMMENDATION: Screening mammogram in one year. (Code:SM-B-01Y) BI-RADS CATEGORY  1: Negative. Electronically Signed   By: Hulan Saas M.D.   On: 05/26/2017 09:56    Assessment & Plan:   Cilicia was seen today for asthma.  Diagnoses  and all orders for this visit:  Mild intermittent asthma without complication- Will restart the ICS. -     fluticasone (FLOVENT HFA) 110 MCG/ACT inhaler; Inhale 1 puff into the lungs 2 (two) times daily.   I have discontinued Christana D. Wren's ipratropium, Flax Seed Oil, azelastine, levocetirizine, and desonide. I am also having her maintain her multivitamin, cholecalciferol, Probiotic Product (PROBIOTIC DAILY PO), rizatriptan, fluticasone, albuterol, and Flovent HFA.  Meds ordered this encounter  Medications  . fluticasone (FLOVENT HFA) 110 MCG/ACT inhaler    Sig: Inhale 1 puff into the lungs 2 (two) times daily.    Dispense:  3 Inhaler    Refill:  1     Follow-up: Return in about 3 months (around 04/12/2020).  Sanda Linger, MD

## 2020-01-11 NOTE — Patient Instructions (Signed)
http://www.aaaai.org/conditions-and-treatments/asthma">  Asthma, Adult  Asthma is a long-term (chronic) condition that causes recurrent episodes in which the airways become tight and narrow. The airways are the passages that lead from the nose and mouth down into the lungs. Asthma episodes, also called asthma attacks, can cause coughing, wheezing, shortness of breath, and chest pain. The airways can also fill with mucus. During an attack, it can be difficult to breathe. Asthma attacks can range from minor to life threatening. Asthma cannot be cured, but medicines and lifestyle changes can help control it and treat acute attacks. What are the causes? This condition is believed to be caused by inherited (genetic) and environmental factors, but its exact cause is not known. There are many things that can bring on an asthma attack or make asthma symptoms worse (triggers). Asthma triggers are different for each person. Common triggers include:  Mold.  Dust.  Cigarette smoke.  Cockroaches.  Things that can cause allergy symptoms (allergens), such as animal dander or pollen from trees or grass.  Air pollutants such as household cleaners, wood smoke, smog, or chemical odors.  Cold air, weather changes, and winds (which increase molds and pollen in the air).  Strong emotional expressions such as crying or laughing hard.  Stress.  Certain medicines (such as aspirin) or types of medicines (such as beta-blockers).  Sulfites in foods and drinks. Foods and drinks that may contain sulfites include dried fruit, potato chips, and sparkling grape juice.  Infections or inflammatory conditions such as the flu, a cold, or inflammation of the nasal membranes (rhinitis).  Gastroesophageal reflux disease (GERD).  Exercise or strenuous activity. What are the signs or symptoms? Symptoms of this condition may occur right after asthma is triggered or many hours later. Symptoms include:  Wheezing. This can  sound like whistling when you breathe.  Excessive nighttime or early morning coughing.  Frequent or severe coughing with a common cold.  Chest tightness.  Shortness of breath.  Tiredness (fatigue) with minimal activity. How is this diagnosed? This condition is diagnosed based on:  Your medical history.  A physical exam.  Tests, which may include: ? Lung function studies and pulmonary studies (spirometry). These tests can evaluate the flow of air in your lungs. ? Allergy tests. ? Imaging tests, such as X-rays. How is this treated? There is no cure for this condition, but treatment can help control your symptoms. Treatment for asthma usually involves:  Identifying and avoiding your asthma triggers.  Using medicines to control your symptoms. Generally, two types of medicines are used to treat asthma: ? Controller medicines. These help prevent asthma symptoms from occurring. They are usually taken every day. ? Fast-acting reliever or rescue medicines. These quickly relieve asthma symptoms by widening the narrow and tight airways. They are used as needed and provide short-term relief.  Using supplemental oxygen. This may be needed during a severe episode.  Using other medicines, such as: ? Allergy medicines, such as antihistamines, if your asthma attacks are triggered by allergens. ? Immune medicines (immunomodulators). These are medicines that help control the immune system.  Creating an asthma action plan. An asthma action plan is a written plan for managing and treating your asthma attacks. This plan includes: ? A list of your asthma triggers and how to avoid them. ? Information about when medicines should be taken and when their dosage should be changed. ? Instructions about using a device called a peak flow meter. A peak flow meter measures how well the lungs are working   and the severity of your asthma. It helps you monitor your condition. Follow these instructions at  home: Controlling your home environment Control your home environment in the following ways to help avoid triggers and prevent asthma attacks:  Change your heating and air conditioning filter regularly.  Limit your use of fireplaces and wood stoves.  Get rid of pests (such as roaches and mice) and their droppings.  Throw away plants if you see mold on them.  Clean floors and dust surfaces regularly. Use unscented cleaning products.  Try to have someone else vacuum for you regularly. Stay out of rooms while they are being vacuumed and for a short while afterward. If you vacuum, use a dust mask from a hardware store, a double-layered or microfilter vacuum cleaner bag, or a vacuum cleaner with a HEPA filter.  Replace carpet with wood, tile, or vinyl flooring. Carpet can trap dander and dust.  Use allergy-proof pillows, mattress covers, and box spring covers.  Keep your bedroom a trigger-free room.  Avoid pets and keep windows closed when allergens are in the air.  Wash beddings every week in hot water and dry them in a dryer.  Use blankets that are made of polyester or cotton.  Clean bathrooms and kitchens with bleach. If possible, have someone repaint the walls in these rooms with mold-resistant paint. Stay out of the rooms that are being cleaned and painted.  Wash your hands often with soap and water. If soap and water are not available, use hand sanitizer.  Do not allow anyone to smoke in your home. General instructions  Take over-the-counter and prescription medicines only as told by your health care provider. ? Speak with your health care provider if you have questions about how or when to take the medicines. ? Make note if you are requiring more frequent dosages.  Do not use any products that contain nicotine or tobacco, such as cigarettes and e-cigarettes. If you need help quitting, ask your health care provider. Also, avoid being exposed to secondhand smoke.  Use a peak  flow meter as told by your health care provider. Record and keep track of the readings.  Understand and use the asthma action plan to help minimize, or stop an asthma attack, without needing to seek medical care.  Make sure you stay up to date on your yearly vaccinations as told by your health care provider. This may include vaccines for the flu and pneumonia.  Avoid outdoor activities when allergen counts are high and when air quality is low.  Wear a ski mask that covers your nose and mouth during outdoor winter activities. Exercise indoors on cold days if you can.  Warm up before exercising, and take time for a cool-down period after exercise.  Keep all follow-up visits as told by your health care provider. This is important. Where to find more information  For information about asthma, turn to the Centers for Disease Control and Prevention at www.cdc.gov/asthma/faqs.htm  For air quality information, turn to AirNow at https://airnow.gov/ Contact a health care provider if:  You have wheezing, shortness of breath, or a cough even while you are taking medicine to prevent attacks.  The mucus you cough up (sputum) is thicker than usual.  Your sputum changes from clear or white to yellow, green, gray, or bloody.  Your medicines are causing side effects, such as a rash, itching, swelling, or trouble breathing.  You need to use a reliever medicine more than 2-3 times a week.  Your peak   flow reading is still at 50-79% of your personal best after following your action plan for 1 hour.  You have a fever. Get help right away if:  You are getting worse and do not respond to treatment during an asthma attack.  You are short of breath when at rest or when doing very little physical activity.  You have difficulty eating, drinking, or talking.  You have chest pain or tightness.  You develop a fast heartbeat or palpitations.  You have a bluish color to your lips or fingernails.  You  are light-headed or dizzy, or you faint.  Your peak flow reading is less than 50% of your personal best.  You feel too tired to breathe normally. Summary  Asthma is a long-term (chronic) condition that causes recurrent episodes in which the airways become tight and narrow. These episodes can cause coughing, wheezing, shortness of breath, and chest pain.  Asthma cannot be cured, but medicines and lifestyle changes can help control it and treat acute attacks.  Make sure you understand how to avoid triggers and how and when to use your medicines.  Asthma attacks can range from minor to life threatening. Get help right away if you have an asthma attack and do not respond to treatment with your usual rescue medicines. This information is not intended to replace advice given to you by your health care provider. Make sure you discuss any questions you have with your health care provider. Document Revised: 07/29/2018 Document Reviewed: 06/30/2016 Elsevier Patient Education  2020 [employer]  

## 2020-01-26 NOTE — Telephone Encounter (Signed)
Patient has not responded. Closing note.  

## 2020-01-27 ENCOUNTER — Encounter: Payer: Self-pay | Admitting: Family Medicine

## 2020-01-27 ENCOUNTER — Ambulatory Visit: Payer: 59 | Admitting: Family Medicine

## 2020-01-27 ENCOUNTER — Other Ambulatory Visit: Payer: Self-pay

## 2020-01-27 VITALS — BP 120/80 | HR 95 | Ht 62.0 in | Wt 165.0 lb

## 2020-01-27 DIAGNOSIS — M545 Low back pain, unspecified: Secondary | ICD-10-CM

## 2020-01-27 DIAGNOSIS — M542 Cervicalgia: Secondary | ICD-10-CM | POA: Diagnosis not present

## 2020-01-27 DIAGNOSIS — M999 Biomechanical lesion, unspecified: Secondary | ICD-10-CM | POA: Diagnosis not present

## 2020-01-27 NOTE — Assessment & Plan Note (Signed)
Stable, multifactorial, discussed posture and ergonomics, which activities to do which wants to avoid.  Increase activity slowly.  Follow-up again in 4 to 8 weeks

## 2020-01-27 NOTE — Assessment & Plan Note (Signed)
Very mild increase in tightness noted today.  Discussed icing regimen, discussed core strengthening, patient was doing more with swimming for a while that I think was beneficial.  Now having increasing discomfort and pain again.  We will continue to monitor.  Patient has tried to stay away from a lot of the medications at this time but may need again gabapentin follow-up again in 4 to 8 weeks

## 2020-01-27 NOTE — Patient Instructions (Signed)
Overall not bad Ferrous gluconate 65 mg 3 times a week See me again in 7-8 weeks

## 2020-01-27 NOTE — Progress Notes (Signed)
[name] [address] [place] [address] Phone: [phone] Subjective:   I [name] am serving as a Neurosurgeon for Dr. Antoine Primas.  This visit occurred during the SARS-CoV-2 public health emergency.  Safety protocols were in place, including screening questions prior to the visit, additional usage of staff PPE, and extensive cleaning of exam room while observing appropriate contact time as indicated for disinfecting solutions.   I'm seeing this patient by the request  of:  Pincus Sanes, MD  CC: Neck and back pain follow-up  [id number]  Melanie [name] is a 59 y.o. female coming in with complaint of back and neck pain. OMT 12/07/2019. Patient states she is doing well. Neck and back "click" every once in a while.  Has not had any significant spasm in the neck, has had a couple nighttime pains that did make her get out of bed more in the legs.  Wanting to know if there was anything to be concerned about.  Patient still try to be active but not finding so.  Still working on the ergonomics at home thinking she may need a new chair  Medications patient has been prescribed: Nothing at this time          Reviewed prior external information including notes and imaging from previsou exam, outside providers and external EMR if available.   As well as notes that were available from care everywhere and other healthcare systems.  Past medical history, social, surgical and family history all reviewed in electronic medical record.  No pertanent information unless stated regarding to the chief complaint.   Past Medical History:  Diagnosis Date  . Allergy   . Asthma   . Osteoporosis     Allergies  Allergen Reactions  . Kiwi Extract Hives, Shortness Of Breath and Itching  . Latex   . Penicillins   . Sulfonamide Derivatives      Review of Systems:  No headache, visual changes, nausea, vomiting, diarrhea, constipation, dizziness, abdominal  pain, skin rash, fevers, chills, night sweats, weight loss, swollen lymph nodes, body aches, joint swelling, chest pain, shortness of breath, mood changes. POSITIVE muscle aches  Objective  Blood pressure 120/80, pulse 95, height 5\' 2"  (1.575 m), weight 165 lb (74.8 kg), SpO2 91 %.   General: No apparent distress alert and oriented x3 mood and affect normal, dressed appropriately.  HEENT: Pupils equal, extraocular movements intact  Respiratory: Patient's speak in full sentences and does not appear short of breath  Cardiovascular: No lower extremity edema, non tender, no erythema  Neuro: Cranial nerves II through XII are intact, neurovascularly intact in all extremities with 2+ DTRs and 2+ pulses.  Gait normal with good balance and coordination.  MSK:  Non tender with full range of motion and good stability and symmetric strength and tone of shoulders, elbows, wrist, hip, knee and ankles bilaterally.  Back - Normal skin, Spine with normal alignment and no deformity.  No tenderness to vertebral process palpation.  Paraspinous muscles are not tender and without spasm.   Range of motion is full at neck and lumbar sacral regions  Osteopathic findings  C7 flexed rotated and side bent left T4 extended rotated and side bent right inhaled rib T8 extended rotated and side bent left L2 flexed rotated and side bent right Sacrum right on right       Assessment and Plan:  Low back pain Very mild increase in tightness noted today.  Discussed icing regimen,  discussed core strengthening, patient was doing more with swimming for a while that I think was beneficial.  Now having increasing discomfort and pain again.  We will continue to monitor.  Patient has tried to stay away from a lot of the medications at this time but may need again gabapentin follow-up again in 4 to 8 weeks  Neck pain Stable, multifactorial, discussed posture and ergonomics, which activities to do which wants to avoid.  Increase  activity slowly.  Follow-up again in 4 to 8 weeks    Nonallopathic problems  Decision today to treat with OMT was based on Physical Exam  After verbal consent patient was treated with HVLA, ME, FPR techniques in cervical, rib, thoracic, lumbar, and sacral  areas  Patient tolerated the procedure well with improvement in symptoms  Patient given exercises, stretches and lifestyle modifications  See medications in patient instructions if given  Patient will follow up in 4-8 weeks      The above documentation has been reviewed and is accurate and complete Judi Saa, DO       Note: This dictation was prepared with Dragon dictation along with smaller phrase technology. Any transcriptional errors that result from this process are unintentional.

## 2020-03-02 ENCOUNTER — Encounter: Payer: 59 | Admitting: Internal Medicine

## 2020-03-26 NOTE — Progress Notes (Signed)
[name] [address] [place] [address] Phone: [phone] Subjective:   [name], am serving as a scribe for Dr. Antoine Primas. This visit occurred during the SARS-CoV-2 public health emergency.  Safety protocols were in place, including screening questions prior to the visit, additional usage of staff PPE, and extensive cleaning of exam room while observing appropriate contact time as indicated for disinfecting solutions.   I'm seeing this patient by the request  of:  Pincus Sanes, MD  CC: Neck and back pain follow-up  [id number]  Melanie [name] is a 59 y.o. female coming in with complaint of back and neck pain. OMT 01/27/2020. Patient states overall continues to have some mild discomfort and pain but is now working on a more regular basis.  Since she has been doing that has been feeling better.  Has had some increase in headaches recently secondary to more of her allergies.  Unable to tolerate such things as Benadryl and antihistamines and wondering if there is anything else she can take.         Reviewed prior external information including notes and imaging from previsou exam, outside providers and external EMR if available.   As well as notes that were available from care everywhere and other healthcare systems.  Past medical history, social, surgical and family history all reviewed in electronic medical record.  No pertanent information unless stated regarding to the chief complaint.   Past Medical History:  Diagnosis Date  . Allergy   . Asthma   . Osteoporosis     Allergies  Allergen Reactions  . Kiwi Extract Hives, Shortness Of Breath and Itching  . Latex   . Penicillins   . Sulfonamide Derivatives      Review of Systems:  No headache, visual changes, nausea, vomiting, diarrhea, constipation, dizziness, abdominal pain, skin rash, fevers, chills, night sweats, weight loss, swollen lymph nodes, body aches, joint  swelling, chest pain, shortness of breath, mood changes. POSITIVE muscle aches  Objective  Blood pressure 112/62, pulse 68, height 5\' 2"  (1.575 m), weight 169 lb (76.7 kg), SpO2 98 %.   General: No apparent distress alert and oriented x3 mood and affect normal, dressed appropriately.  HEENT: Pupils equal, extraocular movements intact  Respiratory: Patient's speak in full sentences and does not appear short of breath  Cardiovascular: No lower extremity edema, non tender, no erythema  Gait normal with good balance and coordination.  MSK:  Non tender with full range of motion and good stability and symmetric strength and tone of shoulders, elbows, wrist, hip, knee and ankles bilaterally.  Back -upper back does have some mild increase in kyphosis.  Patient's neck does have some mild loss of lordosis.  Patient's lower back diffusely tender left greater than right.  Patient on medicine 5-5 strength of all the extremities and deep tendon reflexes intact  Osteopathic findings  C2 flexed rotated and side bent right C6 flexed rotated and side bent left T3 extended rotated and side bent right inhaled rib T9 extended rotated and side bent left L2 flexed rotated and side bent right Sacrum left on left       Assessment and Plan:  Neck pain Stable overall.  Responding well to manipulation.  Patient has had difficulty with her allergies previously and unable to tolerate the antihistamines.  Discussed the potential trial for an H2 blocker instead.  Patient will get that over-the-counter.  Continue to work on posturing and ergonomics throughout the  day.  Follow-up with me again in 4 to 8 weeks    Nonallopathic problems  Decision today to treat with OMT was based on Physical Exam  After verbal consent patient was treated with HVLA, ME, FPR techniques in cervical, rib, thoracic, lumbar, and sacral  areas  Patient tolerated the procedure well with improvement in symptoms  Patient given exercises,  stretches and lifestyle modifications  See medications in patient instructions if given  Patient will follow up in 4-8 weeks      The above documentation has been reviewed and is accurate and complete Judi Saa, DO       Note: This dictation was prepared with Dragon dictation along with smaller phrase technology. Any transcriptional errors that result from this process are unintentional.

## 2020-03-27 ENCOUNTER — Other Ambulatory Visit: Payer: Self-pay

## 2020-03-27 ENCOUNTER — Ambulatory Visit: Payer: 59 | Admitting: Family Medicine

## 2020-03-27 ENCOUNTER — Encounter: Payer: Self-pay | Admitting: Family Medicine

## 2020-03-27 VITALS — BP 112/62 | HR 68 | Ht 62.0 in | Wt 169.0 lb

## 2020-03-27 DIAGNOSIS — M542 Cervicalgia: Secondary | ICD-10-CM | POA: Diagnosis not present

## 2020-03-27 DIAGNOSIS — M999 Biomechanical lesion, unspecified: Secondary | ICD-10-CM

## 2020-03-27 NOTE — Assessment & Plan Note (Signed)
Stable overall.  Responding well to manipulation.  Patient has had difficulty with her allergies previously and unable to tolerate the antihistamines.  Discussed the potential trial for an H2 blocker instead.  Patient will get that over-the-counter.  Continue to work on posturing and ergonomics throughout the day.  Follow-up with me again in 4 to 8 weeks

## 2020-03-27 NOTE — Patient Instructions (Signed)
Try a sports drink with working out Pepcid 2x a day to help with allergies since you cant take benedryl Xolair-Ask Dr. Lawerance Bach  See me again in 7-8 weeks

## 2020-04-09 DIAGNOSIS — J309 Allergic rhinitis, unspecified: Secondary | ICD-10-CM | POA: Insufficient documentation

## 2020-04-09 NOTE — Progress Notes (Signed)
Subjective:    Patient ID: Melanie [name], female    DOB: [birth year], 59 y.o.   MRN: [id number]   This visit occurred during the SARS-CoV-2 public health emergency.  Safety protocols were in place, including screening questions prior to the visit, additional usage of staff PPE, and extensive cleaning of exam room while observing appropriate contact time as indicated for disinfecting solutions.    HPI She is here for a physical exam.    She feels good.  She is struggling with her weight.     Medications and allergies reviewed with patient and updated if appropriate.  Patient Active Problem List   Diagnosis Date Noted  . Allergic rhinitis 04/09/2020  . 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 Low back pain 11/18/2019  . Mild intermittent asthma without complication 07/08/2018  . Nonallopathic lesion of rib cage 01/28/2018  . Nonallopathic lesion of sacral region 12/24/2017  . ETD (Eustachian tube dysfunction), left 07/10/2017  . Greater trochanteric bursitis, right 06/25/2017  . Migraine headache 02/17/2017  . Intercostal pain 02/03/2017  . Polyarthralgia 08/23/2015  . Quadriceps tendinitis 11/16/2014  . Neck pain 07/07/2014  . Concussion with no loss of consciousness 07/07/2014  . Nonallopathic lesion of lumbosacral region 11/04/2013  . Nonallopathic lesion of thoracic region 11/04/2013  . Piriformis syndrome of left side 10/14/2013  . Greater trochanteric bursitis of left hip 10/14/2013  . Trapezius muscle spasm 11/26/2012  . Nonallopathic lesion of cervicothoracic region 11/26/2012  . Backache 09/26/2009  . OTHER ACQUIRED DEFORMITY OF ANKLE AND FOOT OTHER 09/26/2009  . ABNORMALITY OF GAIT 09/26/2009  . KNEE PAIN, RIGHT 08/08/2009  . TENDINITIS, PATELLAR 08/08/2009    Current Outpatient Medications on File Prior to Visit  Medication Sig Dispense Refill  . albuterol (PROAIR HFA) 108 (90 Base) MCG/ACT inhaler Inhale 1 puff into the lungs every 6 (six)  hours as needed for wheezing or shortness of breath. 1 Inhaler 5  . cholecalciferol (VITAMIN D) 1000 units tablet Take 2,000 Units by mouth daily.    [name] desonide (DESOWEN) 0.05 % lotion     . fluticasone (FLONASE) 50 MCG/ACT nasal spray Place 2 sprays into both nostrils daily. 16 g 6  . fluticasone (FLOVENT HFA) 110 MCG/ACT inhaler Inhale 1 puff into the lungs 2 (two) times daily. 3 Inhaler 1  . Multiple Vitamin (MULTIVITAMIN) tablet Take 1 tablet by mouth daily.    . Probiotic Product (PROBIOTIC DAILY PO) Take by mouth.    . rizatriptan (MAXALT-MLT) 10 MG disintegrating tablet Take 1 tablet (10 mg total) by mouth as needed for migraine. May repeat in 2 hours if needed 10 tablet 1   No current facility-administered medications on file prior to visit.    Past Medical History:  Diagnosis Date  . Allergy   . Asthma   . Osteoporosis     Past Surgical History:  Procedure Laterality Date  . ABDOMINAL HYSTERECTOMY    . APPENDECTOMY    . KNEE SURGERY      Social History   Socioeconomic History  . Marital status: Divorced    Spouse name: Not on file  . Number of children: Not on file  . Years of education: Not on file  . Highest education level: Not on file  Occupational History  . Not on file  Tobacco Use  . Smoking status: Never Smoker  . Smokeless tobacco: Never Used  Vaping Use  . Vaping Use: Never used  Substance and Sexual Activity  . Alcohol  use: Yes    Comment: 2/wk  . Drug use: No  . Sexual activity: Not on file  Other Topics Concern  . Not on file  Social History Narrative   Exercises regularly   Social Determinants of Health   Financial Resource Strain:   . Difficulty of Paying Living Expenses: Not on file  Food Insecurity:   . Worried About Programme researcher, broadcasting/film/video in the Last Year: Not on file  . Ran Out of Food in the Last Year: Not on file  Transportation Needs:   . Lack of Transportation (Medical): Not on file  . Lack of Transportation (Non-Medical): Not  on file  Physical Activity:   . Days of Exercise per Week: Not on file  . Minutes of Exercise per Session: Not on file  Stress:   . Feeling of Stress : Not on file  Social Connections:   . Frequency of Communication with Friends and Family: Not on file  . Frequency of Social Gatherings with Friends and Family: Not on file  . Attends Religious Services: Not on file  . Active Member of Clubs or Organizations: Not on file  . Attends Banker Meetings: Not on file  . Marital Status: Not on file    Family History  Problem Relation Age of Onset  . Heart disease Father 49  . Irregular heart beat Mother   . Stroke Maternal Grandmother   . Heart attack Maternal Grandfather   . Stroke Paternal Grandmother   . Heart attack Paternal Grandfather   . Heart attack Brother 45  . Breast cancer Neg Hx     Review of Systems  Constitutional: Negative for chills and fever.  Eyes: Negative for visual disturbance.  Respiratory: Negative for cough, shortness of breath and wheezing.   Cardiovascular: Negative for chest pain and palpitations.  Gastrointestinal: Negative for abdominal pain, blood in stool, constipation, diarrhea and nausea.       Occ GERD  Genitourinary: Negative for dysuria.  Musculoskeletal: Positive for back pain (gets adjusted every 4 weeks or so). Negative for arthralgias.  Skin: Negative for color change and rash.  Neurological: Positive for headaches (occ migraines). Negative for dizziness and light-headedness.  Psychiatric/Behavioral: Negative for dysphoric mood. The patient is not nervous/anxious.        Objective:   Vitals:   04/10/20 0800  BP: 116/78  Pulse: 90  Temp: 98 F (36.7 C)  SpO2: 98%   Filed Weights   04/10/20 0800  Weight: 169 lb (76.7 kg)   Body mass index is 30.91 kg/m.  BP Readings from Last 3 Encounters:  04/10/20 116/78  03/27/20 112/62  01/27/20 120/80    Wt Readings from Last 3 Encounters:  04/10/20 169 lb (76.7 kg)    03/27/20 169 lb (76.7 kg)  01/27/20 165 lb (74.8 kg)     Physical Exam Constitutional: She appears well-developed and well-nourished. No distress.  HENT:  Head: Normocephalic and atraumatic.  Right Ear: External ear normal. Normal ear canal and TM Left Ear: External ear normal.  Normal ear canal and TM Mouth/Throat: Oropharynx is clear and moist.  Eyes: Conjunctivae and EOM are normal.  Neck: Neck supple. No tracheal deviation present. No thyromegaly present.  No carotid bruit  Cardiovascular: Normal rate, regular rhythm and normal heart sounds.   No murmur heard.  No edema. Pulmonary/Chest: Effort normal and breath sounds normal. No respiratory distress. She has no wheezes. She has no rales.  Breast: deferred   Abdominal: Soft. She  exhibits no distension. There is no tenderness.  Lymphadenopathy: She has no cervical adenopathy.  Skin: Skin is warm and dry. She is not diaphoretic.  Psychiatric: She has a normal mood and affect. Her behavior is normal.        Assessment & Plan:   Physical exam: Screening blood work    ordered Immunizations  Had covid vaccines, had flu, ? Up to date with work, discussed shingrix Colonoscopy  - never had - no family history - will refer Mammogram  Due - will schedule Gyn  Due - will schedule Eye exams  Up to date  Exercise  Weights, yoga, stretches Weight  Discussed weight loss.  Currently eating about 1400 cal a day-advised trying to decrease this to 1200 cal and continue her regular exercise. Substance abuse  none      See Problem List for Assessment and Plan of chronic medical problems.

## 2020-04-09 NOTE — Patient Instructions (Addendum)
Blood work was ordered.    All other Health Maintenance issues reviewed.   All recommended immunizations and age-appropriate screenings are up-to-date or discussed.  No immunization administered today.   Medications reviewed and updated.  Changes include :   none  Your prescription(s) have been submitted to your pharmacy. Please take as directed and contact our office if you believe you are having problem(s) with the medication(s).  A referral was ordered for GI for a colonoscopy.     Someone will call you to schedule an appointment.    Please followup in 1 year    Health Maintenance, Female Adopting a healthy lifestyle and getting preventive care are important in promoting health and wellness. Ask your health care provider about:  The right schedule for you to have regular tests and exams.  Things you can do on your own to prevent diseases and keep yourself healthy. What should I know about diet, weight, and exercise? Eat a healthy diet   Eat a diet that includes plenty of vegetables, fruits, low-fat dairy products, and lean protein.  Do not eat a lot of foods that are high in solid fats, added sugars, or sodium. Maintain a healthy weight Body mass index (BMI) is used to identify weight problems. It estimates body fat based on height and weight. Your health care provider can help determine your BMI and help you achieve or maintain a healthy weight. Get regular exercise Get regular exercise. This is one of the most important things you can do for your health. Most adults should:  Exercise for at least 150 minutes each week. The exercise should increase your heart rate and make you sweat (moderate-intensity exercise).  Do strengthening exercises at least twice a week. This is in addition to the moderate-intensity exercise.  Spend less time sitting. Even light physical activity can be beneficial. Watch cholesterol and blood lipids Have your blood tested for lipids and  cholesterol at 57 years of age, then have this test every 5 years. Have your cholesterol levels checked more often if:  Your lipid or cholesterol levels are high.  You are older than 57 years of age.  You are at high risk for heart disease. What should I know about cancer screening? Depending on your health history and family history, you may need to have cancer screening at various ages. This may include screening for:  Breast cancer.  Cervical cancer.  Colorectal cancer.  Skin cancer.  Lung cancer. What should I know about heart disease, diabetes, and high blood pressure? Blood pressure and heart disease  High blood pressure causes heart disease and increases the risk of stroke. This is more likely to develop in people who have high blood pressure readings, are of African descent, or are overweight.  Have your blood pressure checked: ? Every 3-5 years if you are 85-36 years of age. ? Every year if you are 63 years old or older. Diabetes Have regular diabetes screenings. This checks your fasting blood sugar level. Have the screening done:  Once every three years after age 57 if you are at a normal weight and have a low risk for diabetes.  More often and at a younger age if you are overweight or have a high risk for diabetes. What should I know about preventing infection? Hepatitis B If you have a higher risk for hepatitis B, you should be screened for this virus. Talk with your health care provider to find out if you are at risk for hepatitis B  infection. Hepatitis C Testing is recommended for:  Everyone born from [birth year] through 1963.  Anyone with known risk factors for hepatitis C. Sexually transmitted infections (STIs)  Get screened for STIs, including gonorrhea and chlamydia, if: ? You are sexually active and are younger than 57 years of age. ? You are older than 57 years of age and your health care provider tells you that you are at risk for this type of  infection. ? Your sexual activity has changed since you were last screened, and you are at increased risk for chlamydia or gonorrhea. Ask your health care provider if you are at risk.  Ask your health care provider about whether you are at high risk for HIV. Your health care provider may recommend a prescription medicine to help prevent HIV infection. If you choose to take medicine to prevent HIV, you should first get tested for HIV. You should then be tested every 3 months for as long as you are taking the medicine. Pregnancy  If you are about to stop having your period (premenopausal) and you may become pregnant, seek counseling before you get pregnant.  Take 400 to 800 micrograms (mcg) of folic acid every day if you become pregnant.  Ask for birth control (contraception) if you want to prevent pregnancy. Osteoporosis and menopause Osteoporosis is a disease in which the bones lose minerals and strength with aging. This can result in bone fractures. If you are 34 years old or older, or if you are at risk for osteoporosis and fractures, ask your health care provider if you should:  Be screened for bone loss.  Take a calcium or vitamin D supplement to lower your risk of fractures.  Be given hormone replacement therapy (HRT) to treat symptoms of menopause. Follow these instructions at home: Lifestyle  Do not use any products that contain nicotine or tobacco, such as cigarettes, e-cigarettes, and chewing tobacco. If you need help quitting, ask your health care provider.  Do not use street drugs.  Do not share needles.  Ask your health care provider for help if you need support or information about quitting drugs. Alcohol use  Do not drink alcohol if: ? Your health care provider tells you not to drink. ? You are pregnant, may be pregnant, or are planning to become pregnant.  If you drink alcohol: ? Limit how much you use to 0-1 drink a day. ? Limit intake if you are  breastfeeding.  Be aware of how much alcohol is in your drink. In the [place] one drink equals one 12 oz bottle of beer (355 mL), one 5 oz glass of wine (148 mL), or one 1 oz glass of hard liquor (44 mL). General instructions  Schedule regular health, dental, and eye exams.  Stay current with your vaccines.  Tell your health care provider if: ? You often feel depressed. ? You have ever been abused or do not feel safe at home. Summary  Adopting a healthy lifestyle and getting preventive care are important in promoting health and wellness.  Follow your health care provider's instructions about healthy diet, exercising, and getting tested or screened for diseases.  Follow your health care provider's instructions on monitoring your cholesterol and blood pressure. This information is not intended to replace advice given to you by your health care provider. Make sure you discuss any questions you have with your health care provider. Document Revised: 05/19/2018 Document Reviewed: 05/19/2018 Elsevier Patient Education  2020 [employer].

## 2020-04-10 ENCOUNTER — Other Ambulatory Visit: Payer: Self-pay

## 2020-04-10 ENCOUNTER — Encounter: Payer: Self-pay | Admitting: Internal Medicine

## 2020-04-10 ENCOUNTER — Ambulatory Visit (INDEPENDENT_AMBULATORY_CARE_PROVIDER_SITE_OTHER): Payer: 59 | Admitting: Internal Medicine

## 2020-04-10 VITALS — BP 116/78 | HR 90 | Temp 98.0°F | Ht 62.0 in | Wt 169.0 lb

## 2020-04-10 DIAGNOSIS — Z1211 Encounter for screening for malignant neoplasm of colon: Secondary | ICD-10-CM | POA: Diagnosis not present

## 2020-04-10 DIAGNOSIS — J4520 Mild intermittent asthma, uncomplicated: Secondary | ICD-10-CM | POA: Diagnosis not present

## 2020-04-10 DIAGNOSIS — G43701 Chronic migraine without aura, not intractable, with status migrainosus: Secondary | ICD-10-CM

## 2020-04-10 DIAGNOSIS — J309 Allergic rhinitis, unspecified: Secondary | ICD-10-CM | POA: Diagnosis not present

## 2020-04-10 DIAGNOSIS — Z0000 Encounter for general adult medical examination without abnormal findings: Secondary | ICD-10-CM

## 2020-04-10 DIAGNOSIS — G43101 Migraine with aura, not intractable, with status migrainosus: Secondary | ICD-10-CM | POA: Diagnosis not present

## 2020-04-10 LAB — CBC WITH DIFFERENTIAL/PLATELET
Basophils Absolute: 0 10*3/uL (ref 0.0–0.1)
Basophils Relative: 0.3 % (ref 0.0–3.0)
Eosinophils Absolute: 0.1 10*3/uL (ref 0.0–0.7)
Eosinophils Relative: 2.9 % (ref 0.0–5.0)
HCT: 36.8 % (ref 36.0–46.0)
Hemoglobin: 12.4 g/dL (ref 12.0–15.0)
Lymphocytes Relative: 31.1 % (ref 12.0–46.0)
Lymphs Abs: 1.4 10*3/uL (ref 0.7–4.0)
MCHC: 33.7 g/dL (ref 30.0–36.0)
MCV: 92.9 fl (ref 78.0–100.0)
Monocytes Absolute: 0.5 10*3/uL (ref 0.1–1.0)
Monocytes Relative: 11.1 % (ref 3.0–12.0)
Neutro Abs: 2.5 10*3/uL (ref 1.4–7.7)
Neutrophils Relative %: 54.6 % (ref 43.0–77.0)
Platelets: 179 10*3/uL (ref 150.0–400.0)
RBC: 3.97 Mil/uL (ref 3.87–5.11)
RDW: 13 % (ref 11.5–15.5)
WBC: 4.5 10*3/uL (ref 4.0–10.5)

## 2020-04-10 LAB — LIPID PANEL
Cholesterol: 218 mg/dL — ABNORMAL HIGH (ref 0–200)
HDL: 64.4 mg/dL (ref >39.00)
LDL Cholesterol: 143 mg/dL — ABNORMAL HIGH (ref 0–99)
NonHDL: 153.64
Total CHOL/HDL Ratio: 3
Triglycerides: 55 mg/dL (ref 0.0–149.0)
VLDL: 11 mg/dL (ref 0.0–40.0)

## 2020-04-10 LAB — COMPREHENSIVE METABOLIC PANEL
ALT: 40 U/L — ABNORMAL HIGH (ref 0–35)
AST: 32 U/L (ref 0–37)
Albumin: 4.2 g/dL (ref 3.5–5.2)
Alkaline Phosphatase: 86 U/L (ref 39–117)
BUN: 12 mg/dL (ref 6–23)
CO2: 29 mEq/L (ref 19–32)
Calcium: 9.2 mg/dL (ref 8.4–10.5)
Chloride: 104 mEq/L (ref 96–112)
Creatinine, Ser: 0.82 mg/dL (ref 0.40–1.20)
GFR: 79.25 mL/min (ref >60.00)
Glucose, Bld: 88 mg/dL (ref 70–99)
Potassium: 3.6 mEq/L (ref 3.5–5.1)
Sodium: 140 mEq/L (ref 135–145)
Total Bilirubin: 0.3 mg/dL (ref 0.2–1.2)
Total Protein: 7.8 g/dL (ref 6.0–8.3)

## 2020-04-10 LAB — TSH: TSH: 1.78 u[IU]/mL (ref 0.35–4.50)

## 2020-04-10 MED ORDER — RIZATRIPTAN BENZOATE 10 MG PO TBDP: 10.0000 mg | ORAL_TABLET | ORAL | 3 refills | Status: AC | PRN

## 2020-04-10 MED ORDER — ALBUTEROL SULFATE HFA 108 (90 BASE) MCG/ACT IN AERS: 1.0000 | INHALATION_SPRAY | Freq: Four times a day (QID) | RESPIRATORY_TRACT | 8 refills | Status: AC | PRN

## 2020-04-10 MED ORDER — FLOVENT HFA 110 MCG/ACT IN AERO: 1.0000 | INHALATION_SPRAY | Freq: Two times a day (BID) | RESPIRATORY_TRACT | 8 refills | Status: AC

## 2020-04-10 NOTE — Assessment & Plan Note (Signed)
Chronic Occasional headaches Takes Maxalt 10 mg as needed, which is effective Continue

## 2020-04-10 NOTE — Assessment & Plan Note (Signed)
Chronic Mild, intermittent asthma Controlled Continue Flovent 110 mcg per ACT 1 puff to twice daily as needed and albuterol 1 puff every 6 hours as needed

## 2020-04-10 NOTE — Assessment & Plan Note (Signed)
Chronic Controlled Continue Flonase daily as needed Over-the-counter antihistamine as needed

## 2020-05-18 ENCOUNTER — Encounter: Payer: Self-pay | Admitting: Family Medicine

## 2020-05-18 ENCOUNTER — Ambulatory Visit: Payer: 59 | Admitting: Family Medicine

## 2020-05-18 ENCOUNTER — Other Ambulatory Visit: Payer: Self-pay

## 2020-05-18 VITALS — BP 120/90 | HR 90 | Ht 62.0 in

## 2020-05-18 DIAGNOSIS — M542 Cervicalgia: Secondary | ICD-10-CM | POA: Diagnosis not present

## 2020-05-18 DIAGNOSIS — M999 Biomechanical lesion, unspecified: Secondary | ICD-10-CM | POA: Diagnosis not present

## 2020-05-18 NOTE — Patient Instructions (Addendum)
Hope headache goes away See me in 7-8 weeks

## 2020-05-18 NOTE — Progress Notes (Signed)
[name] [address] [place] [address] Phone: [phone] Subjective:   I Melanie [name] am serving as a Neurosurgeon for Dr. Antoine [name].  This visit occurred during the SARS-CoV-2 public health emergency.  Safety protocols were in place, including screening questions prior to the visit, additional usage of staff PPE, and extensive cleaning of exam room while observing appropriate contact time as indicated for disinfecting solutions.   I'm seeing this patient by the request  of:  Melanie Sanes, MD  CC: Neck and back pain follow-up  [id number]  Melanie [name] is a 59 y.o. female coming in with complaint of back and neck pain. OMT 03/27/2020. Patient states she has a migraine today. Overall doing well.  Patient has had a migraine a little bit.  Patient has noted some mild tightness in the neck.  Still some mild tightness in the back but nothing severe.  Medications patient has been prescribed: None          Reviewed prior external information including notes and imaging from previsou exam, outside providers and external EMR if available.   As well as notes that were available from care everywhere and other healthcare systems.  Past medical history, social, surgical and family history all reviewed in electronic medical record.  No pertanent information unless stated regarding to the chief complaint.   Past Medical History:  Diagnosis Date  . Allergy   . Asthma   . Osteoporosis     Allergies  Allergen Reactions  . Flaxseed Oil [Linseed Oil] Anaphylaxis  . Kiwi Extract Hives, Shortness Of Breath and Itching  . Peanut Oil Anaphylaxis, Itching, Palpitations and Shortness Of Breath  . Penicillins Hives and Shortness Of Breath  . Latex   . Sulfonamide Derivatives      Review of Systems:  No headache, visual changes, nausea, vomiting, diarrhea, constipation, dizziness, abdominal pain, skin rash, fevers, chills, night sweats, weight  loss, swollen lymph nodes, body aches, joint swelling, chest pain, shortness of breath, mood changes. POSITIVE muscle aches  Objective  Blood pressure 120/90, pulse 90, height 5\' 2"  (1.575 m), SpO2 98 %.   General: No apparent distress alert and oriented x3 mood and affect normal, dressed appropriately.  HEENT: Pupils equal, extraocular movements intact  Respiratory: Patient's speak in full sentences and does not appear short of breath  Cardiovascular: No lower extremity edema, non tender, no erythema  Neuro: Cranial nerves II through XII are intact, neurovascularly intact in all extremities with 2+ DTRs and 2+ pulses.  Gait normal with good balance and coordination.  MSK:  Non tender with full range of motion and good stability and symmetric strength and tone of shoulders, elbows, wrist, hip, knee and ankles bilaterally.  Back - Normal skin, Spine with normal alignment and no deformity.  No tenderness to vertebral process palpation.  Paraspinous muscles are not tender and without spasm.   Range of motion is full at neck and lumbar sacral regions  Osteopathic findings  C2 flexed rotated and side bent right C6 flexed rotated and side bent left T3 extended rotated and side bent right inhaled rib L3 flexed rotated and side bent right Sacrum right on right       Assessment and Plan:  Neck pain Neck pain that sometimes can cause cervicogenic headaches.  Patient is having migraine that can also cause more intense muscle tightness.  Patient is responding well though to osteopathic manipulation.  Patient does not want any medications for it  and feels like she is doing fairly well.  Follow-up again now in 6 to 8 weeks.  Will call with any worsening symptoms.    Nonallopathic problems  Decision today to treat with OMT was based on Physical Exam  After verbal consent patient was treated with HVLA, ME, FPR techniques in cervical, rib, thoracic, lumbar, and sacral  areas  Patient tolerated  the procedure well with improvement in symptoms  Patient given exercises, stretches and lifestyle modifications  See medications in patient instructions if given  Patient will follow up in 4-8 weeks      The above documentation has been reviewed and is accurate and complete Melanie Saa, DO       Note: This dictation was prepared with Dragon dictation along with smaller phrase technology. Any transcriptional errors that result from this process are unintentional.

## 2020-05-18 NOTE — Assessment & Plan Note (Signed)
Neck pain that sometimes can cause cervicogenic headaches.  Patient is having migraine that can also cause more intense muscle tightness.  Patient is responding well though to osteopathic manipulation.  Patient does not want any medications for it and feels like she is doing fairly well.  Follow-up again now in 6 to 8 weeks.  Will call with any worsening symptoms.

## 2020-06-06 NOTE — Progress Notes (Signed)
[name] [address] [place] [address] Phone: [phone] Subjective:   I Melanie [name] am serving as a Neurosurgeon for Dr. Antoine Primas.  This visit occurred during the SARS-CoV-2 public health emergency.  Safety protocols were in place, including screening questions prior to the visit, additional usage of staff PPE, and extensive cleaning of exam room while observing appropriate contact time as indicated for disinfecting solutions.   I'm seeing this patient by the request  of:  Melanie Sanes, MD  CC: Neck and back pain follow-up  [id number]  Melanie [name] is a 59 y.o. female coming in with complaint of back and neck pain. OMT 05/18/2020. Patient states she has some stiffness in her lower back.  Patient has been lifting weights and not swimming as much.  Patient states that there is some mild tightness.  Does not have any radicular symptoms.  Medications patient has been prescribed: None          Reviewed prior external information including notes and imaging from previsou exam, outside providers and external EMR if available.   As well as notes that were available from care everywhere and other healthcare systems.  Past medical history, social, surgical and family history all reviewed in electronic medical record.  No pertanent information unless stated regarding to the chief complaint.   Past Medical History:  Diagnosis Date  . Allergy   . Asthma   . Osteoporosis     Allergies  Allergen Reactions  . Flaxseed Oil [Linseed Oil] Anaphylaxis  . Kiwi Extract Hives, Shortness Of Breath and Itching  . Peanut Oil Anaphylaxis, Itching, Palpitations and Shortness Of Breath  . Penicillins Hives and Shortness Of Breath  . Latex   . Sulfonamide Derivatives      Review of Systems:  No headache, visual changes, nausea, vomiting, diarrhea, constipation, dizziness, abdominal pain, skin rash, fevers, chills, night sweats, weight loss,  swollen lymph nodes, joint swelling, chest pain, shortness of breath, mood changes. POSITIVE muscle aches, body aches  Objective  Blood pressure 124/80, pulse 95, height 5\' 2"  (1.575 m), weight 170 lb (77.1 kg), SpO2 99 %.   General: No apparent distress alert and oriented x3 mood and affect normal, dressed appropriately.  HEENT: Pupils equal, extraocular movements intact  Respiratory: Patient's speak in full sentences and does not appear short of breath  Cardiovascular: No lower extremity edema, non tender, no erythema  Neuro: Cranial nerves II through XII are intact, neurovascularly intact in all extremities with 2+ DTRs and 2+ pulses.  Gait normal with good balance and coordination.  MSK:  Non tender with full range of motion and good stability and symmetric strength and tone of shoulders, elbows, wrist, hip, knee and ankles bilaterally.  Back -back exam mild loss of lordosis.  Tightness on the right paraspinal musculature at the thoracolumbar juncture as well as the lumbosacral area.  Patient does have mild mild positive on the right.  Neck exam mild loss of lordosis.  Still some limited sidebending right greater than left.  Osteopathic findings   C6 flexed rotated and side bent left T5 extended rotated and side bent right inhaled rib L1 flexed rotated and side bent right Sacrum right on right        Assessment and Plan:  Low back pain Chronic problem with mild exacerbation.  Discussed with patient icing regimen and home exercises.  Discussed avoiding certain activities.  Follow-up with me again 6 to 8 weeks.  Responding  very well to osteopathic manipulation    Nonallopathic problems  Decision today to treat with OMT was based on Physical Exam  After verbal consent patient was treated with HVLA, ME, FPR techniques in cervical, rib, thoracic, lumbar, and sacral  areas  Patient tolerated the procedure well with improvement in symptoms  Patient given exercises,  stretches and lifestyle modifications  See medications in patient instructions if given  Patient will follow up in 4-8 weeks      The above documentation has been reviewed and is accurate and complete Melanie Saa, DO       Note: This dictation was prepared with Dragon dictation along with smaller phrase technology. Any transcriptional errors that result from this process are unintentional.

## 2020-06-07 ENCOUNTER — Encounter: Payer: Self-pay | Admitting: Family Medicine

## 2020-06-07 ENCOUNTER — Ambulatory Visit: Payer: 59 | Admitting: Family Medicine

## 2020-06-07 ENCOUNTER — Other Ambulatory Visit: Payer: 59

## 2020-06-07 ENCOUNTER — Other Ambulatory Visit: Payer: Self-pay

## 2020-06-07 VITALS — BP 124/80 | HR 95 | Ht 62.0 in | Wt 170.0 lb

## 2020-06-07 DIAGNOSIS — M999 Biomechanical lesion, unspecified: Secondary | ICD-10-CM | POA: Diagnosis not present

## 2020-06-07 DIAGNOSIS — M545 Low back pain, unspecified: Secondary | ICD-10-CM | POA: Diagnosis not present

## 2020-06-07 DIAGNOSIS — Z20822 Contact with and (suspected) exposure to covid-19: Secondary | ICD-10-CM | POA: Diagnosis not present

## 2020-06-07 NOTE — Assessment & Plan Note (Signed)
Chronic problem with mild exacerbation.  Discussed with patient icing regimen and home exercises.  Discussed avoiding certain activities.  Follow-up with me again 6 to 8 weeks.  Responding very well to osteopathic manipulation

## 2020-06-07 NOTE — Patient Instructions (Addendum)
Good to see you Seated position in the pelvis See me again in 6-8 weeks

## 2020-06-09 LAB — SARS-COV-2, NAA 2 DAY TAT

## 2020-06-09 LAB — NOVEL CORONAVIRUS, NAA: SARS-CoV-2, NAA: NOT DETECTED

## 2020-06-12 ENCOUNTER — Telehealth: Payer: 59 | Admitting: Emergency Medicine

## 2020-06-12 DIAGNOSIS — R6889 Other general symptoms and signs: Secondary | ICD-10-CM

## 2020-06-12 MED ORDER — BENZONATATE 100 MG PO CAPS: 100.0000 mg | ORAL_CAPSULE | Freq: Two times a day (BID) | ORAL | 0 refills | Status: AC | PRN

## 2020-06-12 NOTE — Progress Notes (Signed)
E-Visit for Corona Virus Screening  Your current symptoms could be consistent with the coronavirus.    Despite having an initial negative test, I would recommend that you be tested again based on your symptoms. It is possible that your symptoms could be flu, but the length of time you've been sick puts you outside of the treatment window for flu.  Continue with supportive measures such as Tylenol/Motrin for fever and body aches.  Many health care providers can now test patients at their office but not all are.  [facility] has multiple testing sites. For information on our COVID testing locations and hours go to [email or web address]  We are enrolling you in our MyChart Home Monitoring for COVID19 . Daily you will receive a questionnaire within the MyChart website. Our COVID 19 response team will be monitoring your responses daily.  Testing Information: The COVID-19 Community Testing sites are testing BY APPOINTMENT ONLY.  You can schedule online at [email or web address]  If you do not have access to a smart phone or computer you may call [phone] for an appointment.   Additional testing sites in the Community:  . For [facility] Testing sites in [facility]  [email or web address]  . For [facility] testing sites in [place]  [email or web address]  . For [email or web address]  . For [facility] testing in [place] and [employer] [email or web address]  . For [facility] testing in [facility]   [email or web address]  For  more information about community testing call [phone]   Please quarantine yourself while awaiting your test results. Please  stay home for a minimum of 10 days from the first day of illness with improving symptoms and you have had 24 hours of no fever (without the use of Tylenol (Acetaminophen) Motrin (Ibuprofen) or any fever reducing medication).  Also - Do not get tested prior to returning to work because once you have had a positive test the test can stay positive for more than a month in some cases.   You should wear a mask or cloth face covering over your nose and mouth if you must be around other people or animals, including pets (even at home). Try to stay at least 6 feet away from other people. This will protect the people around you.  Please continue good preventive care measures, including:  frequent hand-washing, avoid touching your face, cover coughs/sneezes, stay out of crowds and keep a 6 foot distance from others.  COVID-19 is a respiratory illness with symptoms that are similar to the flu. Symptoms are typically mild to moderate, but there have been cases of severe illness and death due to the virus.   The following symptoms may appear 2-14 days after exposure: . Fever . Cough . Shortness of breath or difficulty breathing . Chills . Repeated shaking with chills . Muscle pain . Headache . Sore throat . New loss of taste or smell . Fatigue . Congestion or runny nose . Nausea or vomiting . Diarrhea  Go to the nearest hospital ED for assessment if fever/cough/breathlessness are severe or illness seems like a threat to life.  It is vitally important that if you feel that you have an infection such as this virus or any other virus that you stay home and away from places where you may spread it to others.  You should avoid contact with people age 51 and older.   You can use medication such as A prescription cough medication called Tessalon Perles 100 mg. You may take 1-2 capsules every  8 hours as needed for cough  You may also take acetaminophen (Tylenol) as needed for fever.  Reduce your risk of any  infection by using the same precautions used for avoiding the common cold or flu:  [name] Wash your hands often with soap and warm water for at least 20 seconds.  If soap and water are not readily available, use an alcohol-based hand sanitizer with at least 60% alcohol.  . If coughing or sneezing, cover your mouth and nose by coughing or sneezing into the elbow areas of your shirt or coat, into a tissue or into your sleeve (not your hands). . Avoid shaking hands with others and consider head nods or verbal greetings only. . Avoid touching your eyes, nose, or mouth with unwashed hands.  . Avoid close contact with people who are sick. . Avoid places or events with large numbers of people in one location, like concerts or sporting events. . Carefully consider travel plans you have or are making. . If you are planning any travel outside or inside the [place], visit the CDC's Travelers' Health webpage for the latest health notices. . If you have some symptoms but not all symptoms, continue to monitor at home and seek medical attention if your symptoms worsen. . If you are having a medical emergency, call 911.  HOME CARE . Only take medications as instructed by your medical team. . Drink plenty of fluids and get plenty of rest. . A steam or ultrasonic humidifier can help if you have congestion.   GET HELP RIGHT AWAY IF YOU HAVE EMERGENCY WARNING SIGNS** FOR COVID-19. If you or someone is showing any of these signs seek emergency medical care immediately. Call 911 or proceed to your closest emergency facility if: . You develop worsening high fever. . Trouble breathing . Bluish lips or face . Persistent pain or pressure in the chest . New confusion . Inability to wake or stay awake . You cough up blood. . Your symptoms become more severe  **This list is not all possible symptoms. Contact your medical provider for any symptoms that are sever or concerning to you.  MAKE SURE YOU   Understand these  instructions.  Will watch your condition.  Will get help right away if you are not doing well or get worse.  Your e-visit answers were reviewed by a board certified advanced clinical practitioner to complete your personal care plan.  Depending on the condition, your plan could have included both over the counter or prescription medications.  If there is a problem please reply once you have received a response from your provider.  Your safety is important to [place].  If you have drug allergies check your prescription carefully.    You can use MyChart to ask questions about today's visit, request a non-urgent call back, or ask for a work or school excuse for 24 hours related to this e-Visit. If it has been greater than 24 hours you will need to follow up with your provider, or enter a new e-Visit to address those concerns. You will get an e-mail in the next two days asking about your experience.  I hope that your e-visit has been valuable and will speed your recovery. Thank you for using e-visits.   Approximately 5 minutes was used in reviewing the patient's chart, questionnaire, prescribing medications, and documentation.

## 2020-06-15 ENCOUNTER — Other Ambulatory Visit: Payer: 59

## 2020-06-15 ENCOUNTER — Other Ambulatory Visit: Payer: Self-pay

## 2020-06-15 DIAGNOSIS — Z20822 Contact with and (suspected) exposure to covid-19: Secondary | ICD-10-CM | POA: Diagnosis not present

## 2020-06-19 LAB — NOVEL CORONAVIRUS, NAA: SARS-CoV-2, NAA: NOT DETECTED

## 2020-07-13 ENCOUNTER — Other Ambulatory Visit: Payer: Self-pay

## 2020-07-13 ENCOUNTER — Ambulatory Visit: Payer: 59 | Admitting: Family Medicine

## 2020-07-13 ENCOUNTER — Encounter: Payer: Self-pay | Admitting: Family Medicine

## 2020-07-13 VITALS — BP 140/90 | HR 65 | Ht 62.0 in

## 2020-07-13 DIAGNOSIS — G43701 Chronic migraine without aura, not intractable, with status migrainosus: Secondary | ICD-10-CM | POA: Diagnosis not present

## 2020-07-13 DIAGNOSIS — M999 Biomechanical lesion, unspecified: Secondary | ICD-10-CM

## 2020-07-13 NOTE — Progress Notes (Signed)
[name] [address] [place] [address] Phone: [phone] Subjective:   I Melanie [name] am serving as a Neurosurgeon for Dr. Antoine Primas.  This visit occurred during the SARS-CoV-2 public health emergency.  Safety protocols were in place, including screening questions prior to the visit, additional usage of staff PPE, and extensive cleaning of exam room while observing appropriate contact time as indicated for disinfecting solutions.   I'm seeing this patient by the request  of:  Pincus Sanes, MD  CC: Back and neck pain follow-up  [id number]  Melanie [name] is a 60 y.o. female coming in with complaint of back and neck pain. OMT 06/08/2021. Patient states she has a migraine today due to the change in barometric pressure. Back is bothering her as well. States some tightness of the lower back. Patient has had difficulty with her headaches especially with the weather. Feels like she is having a fairly bad migraine again.          Reviewed prior external information including notes and imaging from previsou exam, outside providers and external EMR if available.   As well as notes that were available from care everywhere and other healthcare systems.  Past medical history, social, surgical and family history all reviewed in electronic medical record.  No pertanent information unless stated regarding to the chief complaint.   Past Medical History:  Diagnosis Date  . Allergy   . Asthma   . Osteoporosis     Allergies  Allergen Reactions  . Flaxseed Oil [Linseed Oil] Anaphylaxis  . Kiwi Extract Hives, Shortness Of Breath and Itching  . Peanut Oil Anaphylaxis, Itching, Palpitations and Shortness Of Breath  . Penicillins Hives and Shortness Of Breath  . Latex   . Sulfonamide Derivatives      Review of Systems:  No  visual changes, nausea, vomiting, diarrhea, constipation, dizziness, abdominal pain, skin rash, fevers, chills, night  sweats, weight loss, swollen lymph nodes, joint swelling, chest pain, shortness of breath, mood changes. POSITIVE muscle aches, headache, body aches  Objective  Blood pressure 140/90, pulse 65, height 5\' 2"  (1.575 m), SpO2 98 %.   General: No apparent distress alert and oriented x3 mood and affect normal, dressed appropriately.  HEENT: Pupils equal, extraocular movements intact  Respiratory: Patient's speak in full sentences and does not appear short of breath  Cardiovascular: No lower extremity edema, non tender, no erythema  Neuro: Cranial nerves II through XII are intact, neurovascularly intact in all extremities with 2+ DTRs and 2+ pulses.  Gait normal with good balance and coordination.  MSK:  Non tender with full range of motion and good stability and symmetric strength and tone of shoulders, elbows, wrist, hip, knee and ankles bilaterally.  Back - Normal skin, Spine with normal alignment and no deformity.  No tenderness to vertebral process palpation.  Paraspinous muscles are not tender and without spasm.   Range of motion is full at neck and lumbar sacral regions  Osteopathic findings  C2 flexed rotated and side bent right C6 flexed rotated and side bent left T3 extended rotated and side bent right inhaled rib T9 extended rotated and side bent left L2 flexed rotated and side bent right Sacrum right on right       Assessment and Plan:  Migraine headache Patient is having a migraine headache. Sometimes does respond well though to osteopathic manipulation. We did discuss the possibility of Toradol and Depo-Medrol which patient hesitant about getting so we  did hold off at this time. Discussed potential prednisone but patient states that it just makes her sick to her stomach. Patient has had a muscle relaxer previously and encouraged her to take it nightly for the next three nights we discussed if headache gets worse she needs to seek medical attention this weekend. Follow-up with me  again 4 to 6 weeks    Nonallopathic problems  Decision today to treat with OMT was based on Physical Exam  After verbal consent patient was treated with HVLA, ME, FPR techniques in cervical, rib, thoracic, lumbar, and sacral  areas  Patient tolerated the procedure well with improvement in symptoms  Patient given exercises, stretches and lifestyle modifications  See medications in patient instructions if given  Patient will follow up in 4-8 weeks      The above documentation has been reviewed and is accurate and complete Judi Saa, DO       Note: This dictation was prepared with Dragon dictation along with smaller phrase technology. Any transcriptional errors that result from this process are unintentional.

## 2020-07-13 NOTE — Patient Instructions (Signed)
Good to see you Use the muscle relaxer at night for the next 3 nights Use headache medicine if you need to If headache gets worse see urgent care or ER See me again in 5 weeks

## 2020-07-13 NOTE — Assessment & Plan Note (Signed)
Patient is having a migraine headache. Sometimes does respond well though to osteopathic manipulation. We did discuss the possibility of Toradol and Depo-Medrol which patient hesitant about getting so we did hold off at this time. Discussed potential prednisone but patient states that it just makes her sick to her stomach. Patient has had a muscle relaxer previously and encouraged her to take it nightly for the next three nights we discussed if headache gets worse she needs to seek medical attention this weekend. Follow-up with me again 4 to 6 weeks

## 2020-08-29 NOTE — Progress Notes (Unsigned)
[name] [address] [place] [address] Phone: [phone] Subjective:   I [name] am serving as a Neurosurgeon for Dr. Antoine Primas.  This visit occurred during the SARS-CoV-2 public health emergency.  Safety protocols were in place, including screening questions prior to the visit, additional usage of staff PPE, and extensive cleaning of exam room while observing appropriate contact time as indicated for disinfecting solutions.   This visit occurred during the SARS-CoV-2 public health emergency.  Safety protocols were in place, including screening questions prior to the visit, additional usage of staff PPE, and extensive cleaning of exam room while observing appropriate contact time as indicated for disinfecting solutions.   I'm seeing this patient by the request  of:  Pincus Sanes, MD  CC: Low back and neck pain follow-up  [id number]  Malaiya D Ellwood is a 60 y.o. female coming in with complaint of back and neck pain Patient states she is doing pretty good today.  Patient still having difficulty with allergies.  Patient denies though any type of radiation to any of the extremities.  Patient has been able to workout a little bit more regularly.  Feels like that has been helping with some of her back.  Has not had to take anything for pain.          Reviewed prior external information including notes and imaging from previsou exam, outside providers and external EMR if available.   As well as notes that were available from care everywhere and other healthcare systems.  Past medical history, social, surgical and family history all reviewed in electronic medical record.  No pertanent information unless stated regarding to the chief complaint.   Past Medical History:  Diagnosis Date  . Allergy   . Asthma   . Osteoporosis     Allergies  Allergen Reactions  . Flaxseed Oil [Linseed Oil] Anaphylaxis  . Kiwi Extract Hives, Shortness Of  Breath and Itching  . Peanut Oil Anaphylaxis, Itching, Palpitations and Shortness Of Breath  . Penicillins Hives and Shortness Of Breath  . Latex   . Sulfonamide Derivatives      Review of Systems:  No visual changes, nausea, vomiting, diarrhea, constipation, dizziness, abdominal pain, skin rash, fevers, chills, night sweats, weight loss, swollen lymph nodes,  joint swelling, chest pain, shortness of breath, mood changes. POSITIVE muscle aches, headache, body aches  Objective  Blood pressure 132/88, pulse 74, height 5\' 2"  (1.575 m), weight 174 lb (78.9 kg), SpO2 100 %.   General: No apparent distress alert and oriented x3 mood and affect normal, dressed appropriately.  HEENT: Pupils equal, extraocular movements intact  Respiratory: Patient's speak in full sentences and does not appear short of breath  Cardiovascular: No lower extremity edema, non tender, no erythema  Neuro: Cranial nerves II through XII are intact, neurovascularly intact in all extremities with 2+ DTRs and 2+ pulses.  Gait normal with good balance and coordination.  MSK:  Non tender with full range of motion and good stability and symmetric strength and tone of shoulders, elbows, wrist, hip, knee and ankles bilaterally.  Back -low back exam shows mild tenderness around the right sacroiliac joint.  Positive noted.  Negative straight leg test.  Mild tightness of the hip flexors bilaterally.  Neck exam does have some mild loss of lordosis.  Negative Spurling's.  Some mild limited range of motion with side bending of the neck bilaterally.  No significant crepitus noted.  5 out of  5 strength of the upper extremities  Osteopathic findings  C2 flexed rotated and side bent right C6 flexed rotated and side bent left T3 extended rotated and side bent right inhaled rib T9 extended rotated and side bent left L2 flexed rotated and side bent right Sacrum right on right       Assessment and Plan:  Low back pain Low  back pain.  Seems to be more in the thoracolumbar juncture and mild tightness of the sacroiliac joint as well noted.  Is responding fairly well though to osteopathic manipulation.  Patient did have better movement today.  No change in medicine.  Follow-up with me again in 6 weeks    Nonallopathic problems  Decision today to treat with OMT was based on Physical Exam  After verbal consent patient was treated with HVLA, ME, FPR techniques in cervical, rib, thoracic, lumbar, and sacral  areas  Patient tolerated the procedure well with improvement in symptoms  Patient given exercises, stretches and lifestyle modifications  See medications in patient instructions if given  Patient will follow up in 4-8 weeks      The above documentation has been reviewed and is accurate and complete Judi Saa, DO       Note: This dictation was prepared with Dragon dictation along with smaller phrase technology. Any transcriptional errors that result from this process are unintentional.

## 2020-08-30 ENCOUNTER — Other Ambulatory Visit: Payer: Self-pay

## 2020-08-30 ENCOUNTER — Ambulatory Visit: Payer: 59 | Admitting: Family Medicine

## 2020-08-30 ENCOUNTER — Encounter: Payer: Self-pay | Admitting: Family Medicine

## 2020-08-30 VITALS — BP 132/88 | HR 74 | Ht 62.0 in | Wt 174.0 lb

## 2020-08-30 DIAGNOSIS — M545 Low back pain, unspecified: Secondary | ICD-10-CM | POA: Diagnosis not present

## 2020-08-30 DIAGNOSIS — M999 Biomechanical lesion, unspecified: Secondary | ICD-10-CM | POA: Diagnosis not present

## 2020-08-30 NOTE — Patient Instructions (Signed)
Good to see you Glad you are doing better Keep workign out  I will message you about the allergy medicine  See me again in 6-7 weeks

## 2020-08-30 NOTE — Assessment & Plan Note (Signed)
Low back pain.  Seems to be more in the thoracolumbar juncture and mild tightness of the sacroiliac joint as well noted.  Is responding fairly well though to osteopathic manipulation.  Patient did have better movement today.  No change in medicine.  Follow-up with me again in 6 weeks

## 2020-09-13 DIAGNOSIS — H1045 Other chronic allergic conjunctivitis: Secondary | ICD-10-CM | POA: Diagnosis not present

## 2020-10-03 NOTE — Progress Notes (Signed)
[name] [address] [place] [address] Phone: [phone] Subjective:   [name], am serving as a scribe for Dr. Antoine Primas. This visit occurred during the SARS-CoV-2 public health emergency.  Safety protocols were in place, including screening questions prior to the visit, additional usage of staff PPE, and extensive cleaning of exam room while observing appropriate contact time as indicated for disinfecting solutions.   I'm seeing this patient by the request  of:  Pincus Sanes, MD  CC: Neck pain and headache follow-up  [id number]  Melanie [name] is a 60 y.o. female coming in with complaint of back and neck pain. OMT 08/30/2020. Patient states that she will feel barometric pressure changes. Otherwise has not had any issues since last visit.  Patient does notice that with the weather still has some discomfort from time to time.  Continues to have tightness of the neck noted.  Medications patient has been prescribed: None          Reviewed prior external information including notes and imaging from previsou exam, outside providers and external EMR if available.   As well as notes that were available from care everywhere and other healthcare systems.  Past medical history, social, surgical and family history all reviewed in electronic medical record.  No pertanent information unless stated regarding to the chief complaint.   Past Medical History:  Diagnosis Date  . Allergy   . Asthma   . Osteoporosis     Allergies  Allergen Reactions  . Flaxseed Oil [Linseed Oil] Anaphylaxis  . Kiwi Extract Hives, Shortness Of Breath and Itching  . Peanut Oil Anaphylaxis, Itching, Palpitations and Shortness Of Breath  . Penicillins Hives and Shortness Of Breath  . Latex   . Sulfonamide Derivatives      Review of Systems:  No  visual changes, nausea, vomiting, diarrhea, constipation, dizziness, abdominal pain, skin rash, fevers,  chills, night sweats, weight loss, swollen lymph nodes, body aches, joint swelling, chest pain, shortness of breath, mood changes. POSITIVE muscle aches, headache  Objective  Blood pressure 120/82, height 5\' 2"  (1.575 m).   General: No apparent distress alert and oriented x3 mood and affect normal, dressed appropriately.  HEENT: Pupils equal, extraocular movements intact  Respiratory: Patient's speak in full sentences and does not appear short of breath  Cardiovascular: No lower extremity edema, non tender, no erythema  Gait normal with good balance and coordination.  MSK:  Non tender with full range of motion and good stability and symmetric strength and tone of shoulders, elbows, wrist, hip, knee and ankles bilaterally.  Back -low back exam does have some mild loss of lordosis.  Tenderness to palpation in the paraspinal musculature of the thoracolumbar juncture.  Neck exam does have some very mild tightness noted bilaterally.  Patient lacks last 5 degrees of extension.  5-5 strength of all extremities.  Osteopathic findings  C2 flexed rotated and side bent right C6 flexed rotated and side bent left T3 extended rotated and side bent right inhaled rib T9 extended rotated and side bent left L2 flexed rotated and side bent right Sacrum right on right       Assessment and Plan:  Neck pain Neck pain that can unfortunately occur and give patient cervicogenic headaches.  Discussed with patient about icing regimen and home exercises.  Discussed posture and ergonomics exam.  No significant change in medications.  Patient will continue her Flonase and other medications for her allergies that  also seem to contribute to some of her migraines.  We will follow-up with me again in 2 months    Nonallopathic problems  Decision today to treat with OMT was based on Physical Exam  After verbal consent patient was treated with HVLA, ME, FPR techniques in cervical, rib, thoracic, lumbar, and sacral   areas  Patient tolerated the procedure well with improvement in symptoms  Patient given exercises, stretches and lifestyle modifications  See medications in patient instructions if given  Patient will follow up in 8 weeks     The above documentation has been reviewed and is accurate and complete Judi Saa, DO       Note: This dictation was prepared with Dragon dictation along with smaller phrase technology. Any transcriptional errors that result from this process are unintentional.

## 2020-10-04 ENCOUNTER — Ambulatory Visit: Payer: 59 | Admitting: Family Medicine

## 2020-10-04 ENCOUNTER — Encounter: Payer: Self-pay | Admitting: Family Medicine

## 2020-10-04 ENCOUNTER — Other Ambulatory Visit: Payer: Self-pay

## 2020-10-04 VITALS — BP 120/82 | Ht 62.0 in

## 2020-10-04 DIAGNOSIS — M9901 Segmental and somatic dysfunction of cervical region: Secondary | ICD-10-CM | POA: Diagnosis not present

## 2020-10-04 DIAGNOSIS — M9908 Segmental and somatic dysfunction of rib cage: Secondary | ICD-10-CM | POA: Diagnosis not present

## 2020-10-04 DIAGNOSIS — M542 Cervicalgia: Secondary | ICD-10-CM

## 2020-10-04 DIAGNOSIS — M9902 Segmental and somatic dysfunction of thoracic region: Secondary | ICD-10-CM | POA: Diagnosis not present

## 2020-10-04 DIAGNOSIS — M9904 Segmental and somatic dysfunction of sacral region: Secondary | ICD-10-CM | POA: Diagnosis not present

## 2020-10-04 DIAGNOSIS — M9903 Segmental and somatic dysfunction of lumbar region: Secondary | ICD-10-CM | POA: Diagnosis not present

## 2020-10-04 NOTE — Patient Instructions (Signed)
Enjoy Myrtle  See me in 8 weeks

## 2020-10-04 NOTE — Assessment & Plan Note (Signed)
Neck pain that can unfortunately occur and give patient cervicogenic headaches.  Discussed with patient about icing regimen and home exercises.  Discussed posture and ergonomics exam.  No significant change in medications.  Patient will continue her Flonase and other medications for her allergies that also seem to contribute to some of her migraines.  We will follow-up with me again in 2 months

## 2020-11-29 ENCOUNTER — Ambulatory Visit: Payer: 59 | Admitting: Family Medicine

## 2020-11-29 NOTE — Progress Notes (Deleted)
  [name] [address] [place] [address] Phone: [phone] Subjective:    I'm seeing this patient by the request  of:  Pincus Sanes, MD  CC: Back pain and neck pain follow-up  [id number]  Melanie [name] is a 60 y.o. female coming in with complaint of back and neck pain. OMT 10/04/2020. Patient states   Medications patient has been prescribed: None  Taking:         Reviewed prior external information including notes and imaging from previsou exam, outside providers and external EMR if available.   As well as notes that were available from care everywhere and other healthcare systems.  Past medical history, social, surgical and family history all reviewed in electronic medical record.  No pertanent information unless stated regarding to the chief complaint.   Past Medical History:  Diagnosis Date   Allergy    Asthma    Osteoporosis     Allergies  Allergen Reactions   Flaxseed Oil [Linseed Oil] Anaphylaxis   Kiwi Extract Hives, Shortness Of Breath and Itching   Peanut Oil Anaphylaxis, Itching, Palpitations and Shortness Of Breath   Penicillins Hives and Shortness Of Breath   Latex    Sulfonamide Derivatives      Review of Systems:  No  visual changes, nausea, vomiting, diarrhea, constipation, dizziness, abdominal pain, skin rash, fevers, chills, night sweats, weight loss, swollen lymph nodes, body aches, joint swelling, chest pain, shortness of breath, mood changes. POSITIVE muscle aches, headache  Objective  There were no vitals taken for this visit.   General: No apparent distress alert and oriented x3 mood and affect normal, dressed appropriately.  HEENT: Pupils equal, extraocular movements intact  Respiratory: Patient's speak in full sentences and does not appear short of breath  Cardiovascular: No lower extremity edema, non tender, no erythema  Low back exam does have some mild loss of lordosis.  Patient does  have some tenderness in the paraspinal musculature more at the thoracolumbar junction again.  Does have some mild discomfort over the sacroiliac joint.  Neck exam also had lacks last 5 degrees of extension and does have tightness with sidebending.  Mild tightness in the sternocleidomastoid on the right side.  5-5 strength of all the extremities  Osteopathic findings  C2 flexed rotated and side bent right C6 flexed rotated and side bent left T3 extended rotated and side bent right inhaled rib T9 extended rotated and side bent left L2 flexed rotated and side bent right Sacrum right on right       Assessment and Plan:  No problem-specific Assessment & Plan notes found for this encounter.   Nonallopathic problems  Decision today to treat with OMT was based on Physical Exam  After verbal consent patient was treated with HVLA, ME, FPR techniques in cervical, rib, thoracic, lumbar, and sacral  areas  Patient tolerated the procedure well with improvement in symptoms  Patient given exercises, stretches and lifestyle modifications  See medications in patient instructions if given  Patient will follow up in 4-8 weeks      The above documentation has been reviewed and is accurate and complete Judi Saa, DO       Note: This dictation was prepared with Dragon dictation along with smaller phrase technology. Any transcriptional errors that result from this process are unintentional.

## 2021-01-02 NOTE — Progress Notes (Signed)
[name] [address] [place] [address] Phone: [phone] Subjective:   Melanie [name], am serving as a scribe for Dr. Antoine Primas.  This visit occurred during the SARS-CoV-2 public health emergency.  Safety protocols were in place, including screening questions prior to the visit, additional usage of staff PPE, and extensive cleaning of exam room while observing appropriate contact time as indicated for disinfecting solutions.   I'm seeing this patient by the request  of:  Pincus Sanes, MD  CC: back and neck pain   [id number]  Melanie [name] is a 60 y.o. female coming in with complaint of back and neck pain. OMT 10/04/2020. Patient states that she is having L side of cervical spine down to L lumbar spine. Has migraine today. Did take Maxalt BID today.  Mostly seems to be posteriorly.  Has significant amount of pain at night.  Patient denies of any true chest pain.  Denies any radiation down the arm.  Denies any sweating or shortness of breath.  Medications patient has been prescribed: None        Reviewed prior external information including notes and imaging from previsou exam, outside providers and external EMR if available.   As well as notes that were available from care everywhere and other healthcare systems.  Past medical history, social, surgical and family history all reviewed in electronic medical record.  No pertanent information unless stated regarding to the chief complaint.   Past Medical History:  Diagnosis Date   Allergy    Asthma    Osteoporosis     Allergies  Allergen Reactions   Flaxseed Oil [Linseed Oil] Anaphylaxis   Kiwi Extract Hives, Shortness Of Breath and Itching   Peanut Oil Anaphylaxis, Itching, Palpitations and Shortness Of Breath   Penicillins Hives and Shortness Of Breath   Latex    Sulfonamide Derivatives      Review of Systems:  No  visual changes, nausea, vomiting, diarrhea,  constipation, dizziness, abdominal pain, skin rash, fevers, chills, night sweats, weight loss, swollen lymph nodes, body aches, joint swelling, chest pain, shortness of breath, mood changes. POSITIVE muscle aches, headache   Objective  Blood pressure 122/78, pulse 97, height 5\' 2"  (1.575 m), weight 174 lb (78.9 kg), SpO2 97 %.   General: No apparent distress alert and oriented x3 mood and affect normal, dressed appropriately.  HEENT: Pupils equal, extraocular movements intact  Respiratory: Patient's speak in full sentences and does not appear short of breath  Cardiovascular: No lower extremity edema, non tender, no erythema  Patient's neck exam does show significant tightness on the left side of the neck.  Negative Spurling's.  Patient does have limited 5 degrees of flexion and lacks 5 degrees of extension.  Patient does have what appears to be more of a slipped rib on the left side. Low back pain f discomfort noted in the paraspinal musculature somewhat around the higher lumbars on the right side as well as the sacroiliac on the right side.  Negative straight leg test.  Osteopathic findings  C2 flexed rotated and side bent left C7 flexed rotated and side bent left T3 extended rotated and side bent right inhaled rib T7 extended rotated and side bent left L2 flexed rotated and side bent right Sacrum right on right     Assessment and Plan:  Migraine headache Patient did have worsening migraine headache at the moment.  Patient has had these.  Feels like her normal headache but  did have more tightness in the neck.  Seem to be more on the left side.  Warned her that if any worsening symptoms she will need to seek medical attention.  Patient understands this but feels like she will be better soon.  Has taken 2 Maxalt already today.  Responded fairly well to manipulation.  Follow-up again in 4 to 6 weeks   Nonallopathic problems  Decision today to treat with OMT was based on Physical  Exam  After verbal consent patient was treated with HVLA, ME, FPR techniques in cervical, rib, thoracic, lumbar, and sacral  areas  Patient tolerated the procedure well with improvement in symptoms  Patient given exercises, stretches and lifestyle modifications  See medications in patient instructions if given  Patient will follow up in 4-8 weeks      The above documentation has been reviewed and is accurate and complete Judi Saa, DO        Note: This dictation was prepared with Dragon dictation along with smaller phrase technology. Any transcriptional errors that result from this process are unintentional.

## 2021-01-03 ENCOUNTER — Ambulatory Visit: Payer: 59 | Admitting: Family Medicine

## 2021-01-03 ENCOUNTER — Other Ambulatory Visit: Payer: Self-pay

## 2021-01-03 ENCOUNTER — Encounter: Payer: Self-pay | Admitting: Family Medicine

## 2021-01-03 VITALS — BP 122/78 | HR 97 | Ht 62.0 in | Wt 174.0 lb

## 2021-01-03 DIAGNOSIS — M9902 Segmental and somatic dysfunction of thoracic region: Secondary | ICD-10-CM

## 2021-01-03 DIAGNOSIS — M9901 Segmental and somatic dysfunction of cervical region: Secondary | ICD-10-CM

## 2021-01-03 DIAGNOSIS — M9903 Segmental and somatic dysfunction of lumbar region: Secondary | ICD-10-CM | POA: Diagnosis not present

## 2021-01-03 DIAGNOSIS — M9908 Segmental and somatic dysfunction of rib cage: Secondary | ICD-10-CM

## 2021-01-03 DIAGNOSIS — G43701 Chronic migraine without aura, not intractable, with status migrainosus: Secondary | ICD-10-CM

## 2021-01-03 DIAGNOSIS — M9904 Segmental and somatic dysfunction of sacral region: Secondary | ICD-10-CM | POA: Diagnosis not present

## 2021-01-03 NOTE — Assessment & Plan Note (Signed)
Patient did have worsening migraine headache at the moment.  Patient has had these.  Feels like her normal headache but did have more tightness in the neck.  Seem to be more on the left side.  Warned her that if any worsening symptoms she will need to seek medical attention.  Patient understands this but feels like she will be better soon.  Has taken 2 Maxalt already today.  Responded fairly well to manipulation.  Follow-up again in 4 to 6 weeks

## 2021-01-03 NOTE — Patient Instructions (Signed)
Go home and take nap Keep hands in peripheral vision Max out 15# db See me in 5-6 weeks

## 2021-02-07 ENCOUNTER — Ambulatory Visit: Payer: 59 | Admitting: Family Medicine

## 2021-02-28 ENCOUNTER — Other Ambulatory Visit: Payer: Self-pay

## 2021-02-28 ENCOUNTER — Ambulatory Visit: Payer: 59 | Admitting: Family Medicine

## 2021-02-28 ENCOUNTER — Encounter: Payer: Self-pay | Admitting: Family Medicine

## 2021-02-28 VITALS — BP 110/72 | HR 82 | Ht 62.0 in

## 2021-02-28 DIAGNOSIS — M9904 Segmental and somatic dysfunction of sacral region: Secondary | ICD-10-CM | POA: Diagnosis not present

## 2021-02-28 DIAGNOSIS — M9908 Segmental and somatic dysfunction of rib cage: Secondary | ICD-10-CM | POA: Diagnosis not present

## 2021-02-28 DIAGNOSIS — M9902 Segmental and somatic dysfunction of thoracic region: Secondary | ICD-10-CM

## 2021-02-28 DIAGNOSIS — M542 Cervicalgia: Secondary | ICD-10-CM

## 2021-02-28 DIAGNOSIS — M9903 Segmental and somatic dysfunction of lumbar region: Secondary | ICD-10-CM | POA: Diagnosis not present

## 2021-02-28 DIAGNOSIS — M9901 Segmental and somatic dysfunction of cervical region: Secondary | ICD-10-CM | POA: Diagnosis not present

## 2021-02-28 NOTE — Patient Instructions (Signed)
Sorry for your loss Try to get back into routine See me again in 4-5 weeks

## 2021-02-28 NOTE — Assessment & Plan Note (Signed)
Increase tightness in the neck noted today.  Discussed icing regimen and home exercises, discussed which activities to do which wants to avoid.  Patient has recently lost her mother that I think contributed to some of the increasing discomfort and pain as well with patient not being as quite as active as usual.  Discussed about moving certain activities.  Patient is working with a Camera operator as well.  Follow-up with me again 6 to 8 weeks

## 2021-02-28 NOTE — Progress Notes (Signed)
[name] [address] [place] [address] Phone: [phone] Subjective:   Melanie [name], am serving as a scribe for Dr. Antoine Primas. This visit occurred during the SARS-CoV-2 public health emergency.  Safety protocols were in place, including screening questions prior to the visit, additional usage of staff PPE, and extensive cleaning of exam room while observing appropriate contact time as indicated for disinfecting solutions.   I'm seeing this patient by the request  of:  Pincus Sanes, MD  CC: Neck and back pain follow-up  [id number]  Melanie [name] is a 60 y.o. female coming in with complaint of back and neck pain. OMT on 01/03/2021. Patient states that she is having migraine today but otherwise L side of lower back and piriformis are painful due to taking care of her mother before she passed. R also bothers her but not as bad as L side. Denies any radiating symptoms. Uses Tylenol prn.   Medications patient has been prescribed: None \          Past Medical History:  Diagnosis Date   Allergy    Asthma    Osteoporosis     Allergies  Allergen Reactions   Flaxseed Oil [Linseed Oil] Anaphylaxis   Kiwi Extract Hives, Shortness Of Breath and Itching   Peanut Oil Anaphylaxis, Itching, Palpitations and Shortness Of Breath   Penicillins Hives and Shortness Of Breath   Latex    Sulfonamide Derivatives      Review of Systems:  No headache, visual changes, nausea, vomiting, diarrhea, constipation, dizziness, abdominal pain, skin rash, fevers, chills, night sweats, weight loss, swollen lymph nodes, body aches, joint swelling, chest pain, shortness of breath, mood changes. POSITIVE muscle aches  Objective  Blood pressure 110/72, pulse 82, height 5\' 2"  (1.575 m), SpO2 98 %.   General: No apparent distress alert and oriented x3 mood and affect normal, dressed appropriately.  HEENT: Pupils equal, extraocular movements intact   Respiratory: Patient's speak in full sentences and does not appear short of breath  Cardiovascular: No lower extremity edema, non tender, no erythema  Neck exam does have some loss of lordosis.  Some tightness noted in the paraspinal musculature.  Patient does have some limited sidebending right greater than left.  Osteopathic findings  C7 flexed rotated and side bent left T3 extended rotated and side bent right inhaled rib T5 extended rotated and side bent left L2 flexed rotated and side bent right Sacrum right on right       Assessment and Plan:  Neck pain Increase tightness in the neck noted today.  Discussed icing regimen and home exercises, discussed which activities to do which wants to avoid.  Patient has recently lost her mother that I think contributed to some of the increasing discomfort and pain as well with patient not being as quite as active as usual.  Discussed about moving certain activities.  Patient is working with a as well.  Follow-up with me again 6 to 8 weeks   Nonallopathic problems  Decision today to treat with OMT was based on Physical Exam  After verbal consent patient was treated with HVLA, ME, FPR techniques in cervical, rib, thoracic, lumbar, and sacral  areas  Patient tolerated the procedure well with improvement in symptoms  Patient given exercises, stretches and lifestyle modifications  See medications in patient instructions if given  Patient will follow up in 4-8 weeks      The above documentation has been  reviewed and is accurate and complete Judi Saa, DO       Note: This dictation was prepared with Dragon dictation along with smaller phrase technology. Any transcriptional errors that result from this process are unintentional.

## 2021-04-04 ENCOUNTER — Ambulatory Visit: Payer: 59 | Admitting: Family Medicine

## 2021-04-12 ENCOUNTER — Encounter: Payer: 59 | Admitting: Internal Medicine

## 2021-04-18 ENCOUNTER — Ambulatory Visit: Payer: 59 | Admitting: Sports Medicine

## 2021-04-18 ENCOUNTER — Ambulatory Visit: Payer: 59 | Admitting: Family Medicine

## 2021-04-18 ENCOUNTER — Other Ambulatory Visit: Payer: Self-pay

## 2021-04-18 VITALS — BP 122/84 | Ht 62.0 in | Wt 174.0 lb

## 2021-04-18 DIAGNOSIS — M9903 Segmental and somatic dysfunction of lumbar region: Secondary | ICD-10-CM

## 2021-04-18 DIAGNOSIS — M9902 Segmental and somatic dysfunction of thoracic region: Secondary | ICD-10-CM

## 2021-04-18 DIAGNOSIS — M542 Cervicalgia: Secondary | ICD-10-CM | POA: Diagnosis not present

## 2021-04-18 DIAGNOSIS — M9908 Segmental and somatic dysfunction of rib cage: Secondary | ICD-10-CM

## 2021-04-18 DIAGNOSIS — M9905 Segmental and somatic dysfunction of pelvic region: Secondary | ICD-10-CM

## 2021-04-18 DIAGNOSIS — M9901 Segmental and somatic dysfunction of cervical region: Secondary | ICD-10-CM

## 2021-04-18 NOTE — Progress Notes (Signed)
[name] [address] [place] [address] Phone: [phone]   Assessment and Plan:     1. Neck pain 2. Somatic dysfunction of cervical region 3. Somatic dysfunction of thoracic region 4. Somatic dysfunction of lumbar region 5. Somatic dysfunction of pelvic region 6. Somatic dysfunction of rib region -Chronic with exacerbation, subsequent visit - Recurrence of typical musculoskeletal complaints including neck pain, thoracic pain the patient think her worsening due to helping her mother with home care as well as the oncoming storm - Patient has received significant relief with OMT in the past.  Elects for repeat OMT today.  Tolerated well per note below. - Decision today to treat with OMT was based on Physical Exam   After verbal consent patient was treated with HVLA (high velocity low amplitude), ME (muscle energy), FPR (flex positional release), ST (soft tissue), PC/PD (Pelvic Compression/ Pelvic Decompression) techniques in cervical, rib, thoracic, lumbar, and pelvic areas. Patient tolerated the procedure well with improvement in symptoms.  Patient educated on potential side effects of soreness and recommended to rest, hydrate, and use Tylenol as needed for pain control.   Pertinent previous records reviewed include none   Follow Up: 4 weeks for repeat OMT   Subjective:   I, Melanie [name], am serving as a scribe for Dr. Richardean Sale  Chief Complaint: Neck and back pain   HPI:   04/18/21 Patien tis a 60 year old female presenting with neck and back pain. Patient last saw Dr. Katrinka Blazing on 02/28/21 afor this reason and had OMT. Today patient states left side more than the right. Migraines to day but based off weather. No new complaints.  Relevant Historical Information: Patient states she has had multiple vertebral fractures many years ago from falling from a chair.  No documentation in our charts.  Additional pertinent review of  systems negative.  Current Outpatient Medications  Medication Sig Dispense Refill   albuterol (PROAIR HFA) 108 (90 Base) MCG/ACT inhaler Inhale 1 puff into the lungs every 6 (six) hours as needed for wheezing or shortness of breath. 1 each 8   benzonatate (TESSALON) 100 MG capsule Take 1 capsule (100 mg total) by mouth 2 (two) times daily as needed for cough. 20 capsule 0   cholecalciferol (VITAMIN D) 1000 units tablet Take 2,000 Units by mouth daily.     fluticasone (FLONASE) 50 MCG/ACT nasal spray Place 2 sprays into both nostrils daily. 16 g 6   fluticasone (FLOVENT HFA) 110 MCG/ACT inhaler Inhale 1 puff into the lungs 2 (two) times daily. 1 each 8   Multiple Vitamin (MULTIVITAMIN) tablet Take 1 tablet by mouth daily.     Probiotic Product (PROBIOTIC DAILY PO) Take by mouth.     rizatriptan (MAXALT-MLT) 10 MG disintegrating tablet Take 1 tablet (10 mg total) by mouth as needed for migraine. May repeat in 2 hours if needed 10 tablet 3   No current facility-administered medications for this visit.      Objective:     Vitals:   04/18/21 1513  BP: 122/84  Weight: 174 lb (78.9 kg)  Height: 5\' 2"  (1.575 m)      Body mass index is 31.83 kg/m.    Physical Exam:     General: Well-appearing, cooperative, sitting comfortably in no acute distress.   OMT Physical Exam:  ASIS Compression Test: Positive Right Cervical: TTP paraspinal, C5 RRSL Rib: Bilateral elevated first rib with TTP Thoracic: TTP paraspinal, T6-10 RRSL Lumbar: TTP paraspinal,  L1-3 RRSL, L5 RLSL Pelvis: Right anterior innominate  Electronically signed by:  Melanie [name] [name] 3:50 PM 04/18/21

## 2021-04-18 NOTE — Patient Instructions (Signed)
Follow up in 4 weeks for OMT

## 2021-04-27 DIAGNOSIS — H524 Presbyopia: Secondary | ICD-10-CM | POA: Diagnosis not present

## 2021-05-15 NOTE — Progress Notes (Deleted)
   Melanie [name] [facility] [address] [place] [address] Phone: [phone]   Assessment and Plan:     There are no diagnoses linked to this encounter.  *** - Patient has received significant relief with OMT in the past.  Elects for repeat OMT today.  Tolerated well per note below. - Decision today to treat with OMT was based on Physical Exam   After verbal consent patient was treated with HVLA (high velocity low amplitude), ME (muscle energy), FPR (flex positional release), ST (soft tissue), PC/PD (Pelvic Compression/ Pelvic Decompression) techniques in cervical, rib, thoracic, lumbar, and pelvic areas. Patient tolerated the procedure well with improvement in symptoms.  Patient educated on potential side effects of soreness and recommended to rest, hydrate, and use Tylenol as needed for pain control.   Pertinent previous records reviewed include ***   Follow Up: ***     Subjective:    I, Melanie [name], am serving as a Neurosurgeon for Doctor Richardean Sale  Chief Complaint: neck and back pain   HPI:   04/18/21 Patien tis a 60 year old female presenting with neck and back pain. Patient last saw Dr. Katrinka Blazing on 02/28/21 afor this reason and had OMT. Today patient states left side more than the right. Migraines to day but based off weather. No new complaints.    05/16/2021 Patient states   Relevant Historical Information: Patient states she has had multiple vertebral fractures many years ago from falling from a chair.  No documentation in our charts.   Additional pertinent review of systems negative.  Current Outpatient Medications  Medication Sig Dispense Refill   albuterol (PROAIR HFA) 108 (90 Base) MCG/ACT inhaler Inhale 1 puff into the lungs every 6 (six) hours as needed for wheezing or shortness of breath. 1 each 8   benzonatate (TESSALON) 100 MG capsule Take 1 capsule (100 mg total) by mouth 2 (two) times daily as needed for cough. 20 capsule 0    cholecalciferol (VITAMIN D) 1000 units tablet Take 2,000 Units by mouth daily.     fluticasone (FLONASE) 50 MCG/ACT nasal spray Place 2 sprays into both nostrils daily. 16 g 6   fluticasone (FLOVENT HFA) 110 MCG/ACT inhaler Inhale 1 puff into the lungs 2 (two) times daily. 1 each 8   Multiple Vitamin (MULTIVITAMIN) tablet Take 1 tablet by mouth daily.     Probiotic Product (PROBIOTIC DAILY PO) Take by mouth.     rizatriptan (MAXALT-MLT) 10 MG disintegrating tablet Take 1 tablet (10 mg total) by mouth as needed for migraine. May repeat in 2 hours if needed 10 tablet 3   No current facility-administered medications for this visit.      Objective:     There were no vitals filed for this visit.    There is no height or weight on file to calculate BMI.    Physical Exam:     General: Well-appearing, cooperative, sitting comfortably in no acute distress.   OMT Physical Exam:  ASIS Compression Test: Positive Right Cervical: TTP paraspinal, *** Rib: Bilateral elevated first rib with TTP Thoracic: TTP paraspinal,*** Lumbar: TTP paraspinal,*** Pelvis: Right anterior innominate  Electronically signed by:  Melanie [name] [facility] 4:04 PM 05/15/21

## 2021-05-16 ENCOUNTER — Ambulatory Visit: Payer: 59 | Admitting: Sports Medicine

## 2021-05-21 NOTE — Progress Notes (Signed)
[name] [address] [place] [address] Phone: [phone] Subjective:   [name], am serving as a scribe for Dr. Antoine Primas.  This visit occurred during the SARS-CoV-2 public health emergency.  Safety protocols were in place, including screening questions prior to the visit, additional usage of staff PPE, and extensive cleaning of exam room while observing appropriate contact time as indicated for disinfecting solutions.   I'm seeing this patient by the request  of:  Pincus Sanes, MD  CC: back and neck pain   [id number]  Melanie [name] is a 60 y.o. female coming in with complaint of back and neck pain. OMT 02/28/2021. Patient states that she has had increase in migraines due to weather changes. Feeling tightness in lower back today. Denies any radiating symptoms.   Medications patient has been prescribed: None          Reviewed prior external information including notes and imaging from previsou exam, outside providers and external EMR if available.   As well as notes that were available from care everywhere and other healthcare systems.  Past medical history, social, surgical and family history all reviewed in electronic medical record.  No pertanent information unless stated regarding to the chief complaint.   Past Medical History:  Diagnosis Date   Allergy    Asthma    Osteoporosis     Allergies  Allergen Reactions   Flaxseed Oil [Linseed Oil] Anaphylaxis   Kiwi Extract Hives, Shortness Of Breath and Itching   Peanut Oil Anaphylaxis, Itching, Palpitations and Shortness Of Breath   Penicillins Hives and Shortness Of Breath   Latex    Sulfonamide Derivatives      Review of Systems:  No headache, visual changes, nausea, vomiting, diarrhea, constipation, dizziness, abdominal pain, skin rash, fevers, chills, night sweats, weight loss, swollen lymph nodes, body aches, joint swelling, chest pain, shortness of  breath, mood changes. POSITIVE muscle aches  Objective  Blood pressure 120/80, pulse 78, height 5\' 2"  (1.575 m), weight 174 lb (78.9 kg), SpO2 96 %.   General: No apparent distress alert and oriented x3 mood and affect normal, dressed appropriately.  HEENT: Pupils equal, extraocular movements intact  Respiratory: Patient's speak in full sentences and does not appear short of breath  Cardiovascular: No lower extremity edema, non tender, no erythema  Back exam does show the patient does have some mild loss of lordosis.  Patient does have significant tightness noted in the parascapular region bilaterally right greater than left.  Patient does have limited range of motion of the neck noted in all planes secondary to tightness.  Osteopathic findings  C2 flexed rotated and side bent right C6 flexed rotated and side bent left T3 extended rotated and side bent right inhaled rib T9 extended rotated and side bent left L2 flexed rotated and side bent right Sacrum right on right       Assessment and Plan:  Neck pain Increasing tightness noted today.  Responded extremely well though to osteopathic manipulation.  Patient did have a fairly recent loss of her mother that I think is contributing as well.  Discussed which activities to do which wants to avoid.  Increase activity slowly.  Follow-up again in 6 to 8 weeks.    Nonallopathic problems  Decision today to treat with OMT was based on Physical Exam  After verbal consent patient was treated with HVLA, ME, FPR techniques in cervical, rib, thoracic, lumbar, and sacral  areas  Patient tolerated the procedure well with improvement in symptoms  Patient given exercises, stretches and lifestyle modifications  See medications in patient instructions if given  Patient will follow up in 4-8 weeks      The above documentation has been reviewed and is accurate and complete Judi Saa, DO        Note: This dictation was prepared with  Dragon dictation along with smaller phrase technology. Any transcriptional errors that result from this process are unintentional.

## 2021-05-22 ENCOUNTER — Encounter: Payer: Self-pay | Admitting: Family Medicine

## 2021-05-22 ENCOUNTER — Ambulatory Visit: Payer: 59 | Admitting: Family Medicine

## 2021-05-22 ENCOUNTER — Other Ambulatory Visit: Payer: Self-pay

## 2021-05-22 VITALS — BP 120/80 | HR 78 | Ht 62.0 in | Wt 174.0 lb

## 2021-05-22 DIAGNOSIS — M9904 Segmental and somatic dysfunction of sacral region: Secondary | ICD-10-CM | POA: Diagnosis not present

## 2021-05-22 DIAGNOSIS — M9903 Segmental and somatic dysfunction of lumbar region: Secondary | ICD-10-CM | POA: Diagnosis not present

## 2021-05-22 DIAGNOSIS — M542 Cervicalgia: Secondary | ICD-10-CM

## 2021-05-22 DIAGNOSIS — M9902 Segmental and somatic dysfunction of thoracic region: Secondary | ICD-10-CM | POA: Diagnosis not present

## 2021-05-22 DIAGNOSIS — M9901 Segmental and somatic dysfunction of cervical region: Secondary | ICD-10-CM

## 2021-05-22 DIAGNOSIS — M9908 Segmental and somatic dysfunction of rib cage: Secondary | ICD-10-CM

## 2021-05-22 NOTE — Assessment & Plan Note (Signed)
Increasing tightness noted today.  Responded extremely well though to osteopathic manipulation.  Patient did have a fairly recent loss of her mother that I think is contributing as well.  Discussed which activities to do which wants to avoid.  Increase activity slowly.  Follow-up again in 6 to 8 weeks.

## 2021-05-22 NOTE — Patient Instructions (Signed)
Good to see you Sorry for your loss [employer] See me in 6 weeks

## 2021-07-04 ENCOUNTER — Ambulatory Visit: Payer: 59 | Admitting: Family Medicine

## 2021-07-16 NOTE — Progress Notes (Signed)
[facility] [place] Phone: [phone] Subjective:   Melanie [name], am serving as a scribe for Dr. Hulan Saas. This visit occurred during the SARS-CoV-2 public health emergency.  Safety protocols were in place, including screening questions prior to the visit, additional usage of staff PPE, and extensive cleaning of exam room while observing appropriate contact time as indicated for disinfecting solutions.   I'm seeing this patient by the request  of:  Binnie Rail, MD  CC: back and neck pain   [id number]  Melanie [name] is a 61 y.o. female coming in with complaint of back and neck pain. OMT on 05/22/2021. Patient states that she had to cancel last appointment due to work meeting. Having neck tightness. Has slight headache today.   Medications patient has been prescribed: None         Reviewed prior external information including notes and imaging from previsou exam, outside providers and external EMR if available.   As well as notes that were available from care everywhere and other healthcare systems.  Past medical history, social, surgical and family history all reviewed in electronic medical record.  [name] pertanent information unless stated regarding to the chief complaint.   Past Medical History:  Diagnosis Date   Allergy    Asthma    Osteoporosis     Allergies  Allergen Reactions   Flaxseed Oil [Linseed Oil] Anaphylaxis   Kiwi Extract Hives, Shortness Of Breath and Itching   Peanut Oil Anaphylaxis, Itching, Palpitations and Shortness Of Breath   Penicillins Hives and Shortness Of Breath   Latex    Sulfonamide Derivatives      Review of Systems:  [name] headache, visual changes, nausea, vomiting, diarrhea, constipation, dizziness, abdominal pain, skin rash, fevers, chills, night sweats, weight loss, swollen lymph nodes, body aches, joint swelling, chest pain, shortness of breath, mood changes.  POSITIVE muscle aches  Objective  Blood pressure 122/86, height 5\' 2"  (1.575 m), weight 170 lb (77.1 kg).   General: [name] apparent distress alert and oriented x3 mood and affect normal, dressed appropriately.  HEENT: Pupils equal, extraocular movements intact  Respiratory: Patient's speak in full sentences and does not appear short of breath  Cardiovascular: [name] lower extremity edema, non tender, [name] erythema  Neck exam shows mild loss of lordosis,  Back exam shows low back exam tightness noted positive FABER   Osteopathic findings  C2 flexed rotated and side bent right C7 flexed rotated and side bent left T3 extended rotated and side bent right inhaled rib T6 extended rotated and side bent left L2 flexed rotated and side bent right Sacrum right on right     Assessment and Plan:  Neck pain Patient is discussed with patient icing regimen at home ultrasound.  Has been sometime since we have seen the patient.  Discussed which activities to do 1.  Increase activity slowly.  Patient still wants to avoid too many medications at this time which I think is a good idea.  Patient has not been working out as regularly and would like her encouraged to take more time for herself.  Stress seems to be an exacerbating factor.  Follow-up with me again in 6 to 8 weeks    Nonallopathic problems  Decision today to treat with OMT was based on Physical Exam  After verbal consent patient was treated with HVLA, ME, FPR techniques in cervical, rib, thoracic, lumbar, and sacral  areas  Patient tolerated the  procedure well with improvement in symptoms  Patient given exercises, stretches and lifestyle modifications  See medications in patient instructions if given  Patient will follow up in 4-8 weeks      The above documentation has been reviewed and is accurate and complete [name], DO        Note: This dictation was prepared with Dragon dictation along with smaller phrase technology.  Any transcriptional errors that result from this process are unintentional.

## 2021-07-17 ENCOUNTER — Ambulatory Visit: Payer: 59 | Admitting: Family Medicine

## 2021-07-17 ENCOUNTER — Other Ambulatory Visit: Payer: Self-pay

## 2021-07-17 ENCOUNTER — Encounter: Payer: Self-pay | Admitting: Family Medicine

## 2021-07-17 VITALS — BP 122/86 | Ht 62.0 in | Wt 170.0 lb

## 2021-07-17 DIAGNOSIS — M9901 Segmental and somatic dysfunction of cervical region: Secondary | ICD-10-CM | POA: Diagnosis not present

## 2021-07-17 DIAGNOSIS — M9903 Segmental and somatic dysfunction of lumbar region: Secondary | ICD-10-CM | POA: Diagnosis not present

## 2021-07-17 DIAGNOSIS — M9904 Segmental and somatic dysfunction of sacral region: Secondary | ICD-10-CM

## 2021-07-17 DIAGNOSIS — M9902 Segmental and somatic dysfunction of thoracic region: Secondary | ICD-10-CM

## 2021-07-17 DIAGNOSIS — M542 Cervicalgia: Secondary | ICD-10-CM | POA: Diagnosis not present

## 2021-07-17 DIAGNOSIS — M9908 Segmental and somatic dysfunction of rib cage: Secondary | ICD-10-CM | POA: Diagnosis not present

## 2021-07-17 NOTE — Patient Instructions (Signed)
Good to see you Don't let weather get to you See me in 6 weeks

## 2021-07-17 NOTE — Assessment & Plan Note (Signed)
Patient is discussed with patient icing regimen at home ultrasound.  Has been sometime since we have seen the patient.  Discussed which activities to do 1.  Increase activity slowly.  Patient still wants to avoid too many medications at this time which I think is a good idea.  Patient has not been working out as regularly and would like her encouraged to take more time for herself.  Stress seems to be an exacerbating factor.  Follow-up with me again in 6 to 8 weeks

## 2021-08-19 NOTE — Progress Notes (Unsigned)
°  [name] [address] [place] [address] Phone: [phone] Subjective:    I'm seeing this patient by the request  of:  Pincus Sanes, MD  CC:   [id number]  Melanie [name] is a 61 y.o. female coming in with complaint of back and neck pain. OMT 07/17/2021. Patient states   Medications patient has been prescribed: None  Taking:         Reviewed prior external information including notes and imaging from previsou exam, outside providers and external EMR if available.   As well as notes that were available from care everywhere and other healthcare systems.  Past medical history, social, surgical and family history all reviewed in electronic medical record.  No pertanent information unless stated regarding to the chief complaint.   Past Medical History:  Diagnosis Date   Allergy    Asthma    Osteoporosis     Allergies  Allergen Reactions   Flaxseed Oil [Linseed Oil] Anaphylaxis   Kiwi Extract Hives, Shortness Of Breath and Itching   Peanut Oil Anaphylaxis, Itching, Palpitations and Shortness Of Breath   Penicillins Hives and Shortness Of Breath   Latex    Sulfonamide Derivatives      Review of Systems:  No headache, visual changes, nausea, vomiting, diarrhea, constipation, dizziness, abdominal pain, skin rash, fevers, chills, night sweats, weight loss, swollen lymph nodes, body aches, joint swelling, chest pain, shortness of breath, mood changes. POSITIVE muscle aches  Objective  There were no vitals taken for this visit.   General: No apparent distress alert and oriented x3 mood and affect normal, dressed appropriately.  HEENT: Pupils equal, extraocular movements intact  Respiratory: Patient's speak in full sentences and does not appear short of breath  Cardiovascular: No lower extremity edema, non tender, no erythema  Neuro: Cranial nerves II through XII are intact, neurovascularly intact in all extremities with 2+  DTRs and 2+ pulses.  Gait normal with good balance and coordination.  MSK:  Non tender with full range of motion and good stability and symmetric strength and tone of shoulders, elbows, wrist, hip, knee and ankles bilaterally.  Back - Normal skin, Spine with normal alignment and no deformity.  No tenderness to vertebral process palpation.  Paraspinous muscles are not tender and without spasm.   Range of motion is full at neck and lumbar sacral regions  Osteopathic findings  C2 flexed rotated and side bent right C6 flexed rotated and side bent left T3 extended rotated and side bent right inhaled rib T9 extended rotated and side bent left L2 flexed rotated and side bent right Sacrum right on right       Assessment and Plan:    Nonallopathic problems  Decision today to treat with OMT was based on Physical Exam  After verbal consent patient was treated with HVLA, ME, FPR techniques in cervical, rib, thoracic, lumbar, and sacral  areas  Patient tolerated the procedure well with improvement in symptoms  Patient given exercises, stretches and lifestyle modifications  See medications in patient instructions if given  Patient will follow up in 4-8 weeks      The above documentation has been reviewed and is accurate and complete Wilford Grist       Note: This dictation was prepared with Dragon dictation along with smaller phrase technology. Any transcriptional errors that result from this process are unintentional.

## 2021-08-22 ENCOUNTER — Other Ambulatory Visit: Payer: Self-pay

## 2021-08-22 ENCOUNTER — Ambulatory Visit (INDEPENDENT_AMBULATORY_CARE_PROVIDER_SITE_OTHER): Payer: 59 | Admitting: Family Medicine

## 2021-08-22 ENCOUNTER — Encounter: Payer: Self-pay | Admitting: Family Medicine

## 2021-08-22 VITALS — BP 120/76 | HR 79 | Ht 62.0 in | Wt 175.8 lb

## 2021-08-22 DIAGNOSIS — M9902 Segmental and somatic dysfunction of thoracic region: Secondary | ICD-10-CM

## 2021-08-22 DIAGNOSIS — M9903 Segmental and somatic dysfunction of lumbar region: Secondary | ICD-10-CM | POA: Diagnosis not present

## 2021-08-22 DIAGNOSIS — M545 Low back pain, unspecified: Secondary | ICD-10-CM

## 2021-08-22 DIAGNOSIS — M9908 Segmental and somatic dysfunction of rib cage: Secondary | ICD-10-CM | POA: Diagnosis not present

## 2021-08-22 DIAGNOSIS — M9904 Segmental and somatic dysfunction of sacral region: Secondary | ICD-10-CM | POA: Diagnosis not present

## 2021-08-22 DIAGNOSIS — M9901 Segmental and somatic dysfunction of cervical region: Secondary | ICD-10-CM

## 2021-08-22 NOTE — Assessment & Plan Note (Signed)
Overall doing well, discussed HEp, patient has increased strength as well recently. Discussed to continue  ?RTC in 6-8 weeks  ?

## 2021-08-22 NOTE — Patient Instructions (Addendum)
Good to see you.  ? ?You're doing great. ? ?Love the alternating weights and cardio. ? ?Follow-up: 6-8 weeks ? ?

## 2021-10-02 NOTE — Progress Notes (Signed)
?Terrilee Files D.O. ?[facility] ?[address] [place] [address] ?Phone: [phone] ?Subjective:   ?I, Melanie [name], am serving as a Neurosurgeon for Dr. Antoine Primas. ?This visit occurred during the SARS-CoV-2 public health emergency.  Safety protocols were in place, including screening questions prior to the visit, additional usage of staff PPE, and extensive cleaning of exam room while observing appropriate contact time as indicated for disinfecting solutions.  ? ?I'm seeing this patient by the request  of:  Pincus Sanes, MD ? ?CC: Back and neck pain follow-up ? ?[id number]  ?Melanie [name] is a 61 y.o. female coming in with complaint of back and neck pain. OMT on 08/22/2021. Patient states same per usual. No new complaints. ? ?Medications patient has been prescribed: None ? ?Taking: ? ? ?  ? ? ? ? ?Reviewed prior external information including notes and imaging from previsou exam, outside providers and external EMR if available.  ? ?As well as notes that were available from care everywhere and other healthcare systems. ? ?Past medical history, social, surgical and family history all reviewed in electronic medical record.  No pertanent information unless stated regarding to the chief complaint.  ? ?Past Medical History:  ?Diagnosis Date  ? Allergy   ? Asthma   ? Osteoporosis   ?  ?Allergies  ?Allergen Reactions  ? Flaxseed Oil [Flaxseed (Linseed)] Anaphylaxis  ? Kiwi Extract Hives, Shortness Of Breath and Itching  ? Peanut Oil Anaphylaxis, Itching, Palpitations and Shortness Of Breath  ? Penicillins Hives and Shortness Of Breath  ? Latex   ? Sulfonamide Derivatives   ? ? ? ?Review of Systems: ? No  visual changes, nausea, vomiting, diarrhea, constipation, dizziness, abdominal pain, skin rash, fevers, chills, night sweats, weight loss, swollen lymph nodes, body aches, joint swelling, chest pain, shortness of breath, mood changes. POSITIVE muscle aches, body aches, headache  ? ?Objective   ?Blood pressure (!) 136/94, pulse 80, height 5\' 2"  (1.575 m), weight 176 lb (79.8 kg), SpO2 99 %. ?  ?General: No apparent distress alert and oriented x3 mood and affect normal, dressed appropriately.  ?HEENT: Pupils equal, extraocular movements intact  ?Respiratory: Patient's speak in full sentences and does not appear short of breath  ?Cardiovascular: No lower extremity edema, non tender, no erythema  ?Neck exam does have significant amount of tightness noted.  Tenderness to palpation of the paraspinal musculature.  Negative Spurling's noted.  Patient does have some increasing discomfort also noted in the paraspinal musculature of the lumbar spine right greater than left ? ?Osteopathic findings ? ?C3 flexed rotated and side bent right ?C6 flexed rotated and side bent right  ?T3 extended rotated and side bent right inhaled rib ?T9 extended rotated and side bent left ?L1 flexed rotated and side bent right ?Sacrum right on right ? ? ? ? ?  ?Assessment and Plan: ? ?Migraine headache ?Continues to have intermittent migraine headaches.  Seems to be more secondary to weather.  Patient with discussed icing regimen and home exercises, patient wants to avoid any type of medications but can call if anything is necessary.  Follow-up again in 6 to 8 weeks ?  ? ?Nonallopathic problems ? ?Decision today to treat with OMT was based on Physical Exam ? ?After verbal consent patient was treated with HVLA, ME, FPR techniques in cervical, rib, thoracic, lumbar, and sacral  areas ? ?Patient tolerated the procedure well with improvement in symptoms ? ?Patient given exercises, stretches and lifestyle modifications ? ?See medications  in patient instructions if given ? ?Patient will follow up in 4-8 weeks ? ?  ? ? ?The above documentation has been reviewed and is accurate and complete Judi Saa, DO ? ? ? ?  ? ? Note: This dictation was prepared with Dragon dictation along with smaller phrase technology. Any transcriptional errors  that result from this process are unintentional.    ?  ?  ? ?

## 2021-10-03 ENCOUNTER — Ambulatory Visit (INDEPENDENT_AMBULATORY_CARE_PROVIDER_SITE_OTHER): Payer: 59 | Admitting: Family Medicine

## 2021-10-03 VITALS — BP 136/94 | HR 80 | Ht 62.0 in | Wt 176.0 lb

## 2021-10-03 DIAGNOSIS — M9901 Segmental and somatic dysfunction of cervical region: Secondary | ICD-10-CM

## 2021-10-03 DIAGNOSIS — G43701 Chronic migraine without aura, not intractable, with status migrainosus: Secondary | ICD-10-CM | POA: Diagnosis not present

## 2021-10-03 DIAGNOSIS — M9902 Segmental and somatic dysfunction of thoracic region: Secondary | ICD-10-CM | POA: Diagnosis not present

## 2021-10-03 DIAGNOSIS — M9904 Segmental and somatic dysfunction of sacral region: Secondary | ICD-10-CM

## 2021-10-03 DIAGNOSIS — M9908 Segmental and somatic dysfunction of rib cage: Secondary | ICD-10-CM

## 2021-10-03 DIAGNOSIS — M9903 Segmental and somatic dysfunction of lumbar region: Secondary | ICD-10-CM

## 2021-10-03 NOTE — Assessment & Plan Note (Signed)
Continues to have intermittent migraine headaches.  Seems to be more secondary to weather.  Patient with discussed icing regimen and home exercises, patient wants to avoid any type of medications but can call if anything is necessary.  Follow-up again in 6 to 8 weeks ?

## 2021-10-03 NOTE — Patient Instructions (Signed)
Hope the headache goes away soon ?See you again in 6-8 weeks ?

## 2021-11-21 ENCOUNTER — Ambulatory Visit: Payer: 59 | Admitting: Family Medicine

## 2021-11-27 NOTE — Progress Notes (Deleted)
  [name] [address] [place] [address] Phone: [phone] Subjective:    I'm seeing this patient by the request  of:  Pincus Sanes, MD  CC:   [id number]  Melanie [name] is a 61 y.o. female coming in with complaint of back and neck pain. OMT on 10/03/2021. Patient states   Medications patient has been prescribed: None  Taking:         Reviewed prior external information including notes and imaging from previsou exam, outside providers and external EMR if available.   As well as notes that were available from care everywhere and other healthcare systems.  Past medical history, social, surgical and family history all reviewed in electronic medical record.  No pertanent information unless stated regarding to the chief complaint.   Past Medical History:  Diagnosis Date   Allergy    Asthma    Osteoporosis     Allergies  Allergen Reactions   Flaxseed Oil [Flaxseed (Linseed)] Anaphylaxis   Kiwi Extract Hives, Shortness Of Breath and Itching   Peanut Oil Anaphylaxis, Itching, Palpitations and Shortness Of Breath   Penicillins Hives and Shortness Of Breath   Latex    Sulfonamide Derivatives      Review of Systems:  No headache, visual changes, nausea, vomiting, diarrhea, constipation, dizziness, abdominal pain, skin rash, fevers, chills, night sweats, weight loss, swollen lymph nodes, body aches, joint swelling, chest pain, shortness of breath, mood changes. POSITIVE muscle aches  Objective  There were no vitals taken for this visit.   General: No apparent distress alert and oriented x3 mood and affect normal, dressed appropriately.  HEENT: Pupils equal, extraocular movements intact  Respiratory: Patient's speak in full sentences and does not appear short of breath  Cardiovascular: No lower extremity edema, non tender, no erythema  Gait MSK:  Back   Osteopathic findings  C2 flexed rotated and side bent right C6  flexed rotated and side bent left T3 extended rotated and side bent right inhaled rib T9 extended rotated and side bent left L2 flexed rotated and side bent right Sacrum right on right       Assessment and Plan:  No problem-specific Assessment & Plan notes found for this encounter.    Nonallopathic problems  Decision today to treat with OMT was based on Physical Exam  After verbal consent patient was treated with HVLA, ME, FPR techniques in cervical, rib, thoracic, lumbar, and sacral  areas  Patient tolerated the procedure well with improvement in symptoms  Patient given exercises, stretches and lifestyle modifications  See medications in patient instructions if given  Patient will follow up in 4-8 weeks     The above documentation has been reviewed and is accurate and complete Judi Saa, DO         Note: This dictation was prepared with Dragon dictation along with smaller phrase technology. Any transcriptional errors that result from this process are unintentional.

## 2021-12-03 ENCOUNTER — Ambulatory Visit: Payer: 59 | Admitting: Family Medicine

## 2021-12-12 NOTE — Progress Notes (Unsigned)
  [name] [address] [place] [address] Phone: [phone] Subjective:   INadine Counts, am serving as a scribe for Dr. Antoine Primas.  I'm seeing this patient by the request  of:  Pincus Sanes, MD  CC: back and neck pain   [id number]  Melanie [name] is a 61 y.o. female coming in with complaint of back and neck pain. OMT on 10/03/2021. Patient states same per usual. No new complaints.  Medications patient has been prescribed: None  Taking:         Reviewed prior external information including notes and imaging from previsou exam, outside providers and external EMR if available.   As well as notes that were available from care everywhere and other healthcare systems.  Past medical history, social, surgical and family history all reviewed in electronic medical record.  No pertanent information unless stated regarding to the chief complaint.   Past Medical History:  Diagnosis Date   Allergy    Asthma    Osteoporosis     Allergies  Allergen Reactions   Flaxseed Oil [Flaxseed (Linseed)] Anaphylaxis   Kiwi Extract Hives, Shortness Of Breath and Itching   Peanut Oil Anaphylaxis, Itching, Palpitations and Shortness Of Breath   Penicillins Hives and Shortness Of Breath   Latex    Sulfonamide Derivatives      Review of Systems:  No headache, visual changes, nausea, vomiting, diarrhea, constipation, dizziness, abdominal pain, skin rash, fevers, chills, night sweats, weight loss, swollen lymph nodes, body aches, joint swelling, chest pain, shortness of breath, mood changes. POSITIVE muscle aches  Objective  There were no vitals taken for this visit.   General: No apparent distress alert and oriented x3 mood and affect normal, dressed appropriately.  HEENT: Pupils equal, extraocular movements intact  Respiratory: Patient's speak in full sentences and does not appear short of breath  Cardiovascular: No lower extremity edema,  non tender, no erythema  Gait MSK:  Back   Osteopathic findings  C2 flexed rotated and side bent right C6 flexed rotated and side bent left T3 extended rotated and side bent right inhaled rib T9 extended rotated and side bent left L2 flexed rotated and side bent right Sacrum right on right     Assessment and Plan:  No problem-specific Assessment & Plan notes found for this encounter.    Nonallopathic problems  Decision today to treat with OMT was based on Physical Exam  After verbal consent patient was treated with HVLA, ME, FPR techniques in cervical, rib, thoracic, lumbar, and sacral  areas  Patient tolerated the procedure well with improvement in symptoms  Patient given exercises, stretches and lifestyle modifications  See medications in patient instructions if given  Patient will follow up in 4-8 weeks     The above documentation has been reviewed and is accurate and complete Judi Saa, DO         Note: This dictation was prepared with Dragon dictation along with smaller phrase technology. Any transcriptional errors that result from this process are unintentional.

## 2021-12-18 ENCOUNTER — Ambulatory Visit: Payer: 59 | Admitting: Family Medicine

## 2021-12-18 VITALS — BP 122/78 | HR 55 | Ht 62.0 in | Wt 174.0 lb

## 2021-12-18 DIAGNOSIS — M542 Cervicalgia: Secondary | ICD-10-CM

## 2021-12-18 DIAGNOSIS — M9908 Segmental and somatic dysfunction of rib cage: Secondary | ICD-10-CM | POA: Diagnosis not present

## 2021-12-18 DIAGNOSIS — M9902 Segmental and somatic dysfunction of thoracic region: Secondary | ICD-10-CM | POA: Diagnosis not present

## 2021-12-18 DIAGNOSIS — M9904 Segmental and somatic dysfunction of sacral region: Secondary | ICD-10-CM

## 2021-12-18 DIAGNOSIS — M9901 Segmental and somatic dysfunction of cervical region: Secondary | ICD-10-CM | POA: Diagnosis not present

## 2021-12-18 DIAGNOSIS — M9903 Segmental and somatic dysfunction of lumbar region: Secondary | ICD-10-CM

## 2021-12-18 DIAGNOSIS — M545 Low back pain, unspecified: Secondary | ICD-10-CM
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
## 2021-12-18 NOTE — Assessment & Plan Note (Signed)
Low back exam does have some loss of lordosis.  Some tenderness to palpation in the paraspinal musculature.  Discussed icing regimen and home exercises.  Patient is responding well though to osteopathic manipulation.  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  Follow-up again in 6 to 8 weeks

## 2021-12-24 ENCOUNTER — Encounter: Payer: Self-pay | Admitting: Family Medicine

## 2021-12-26 NOTE — Telephone Encounter (Signed)
Spoke with patient and she would like FMLA for days she is off due to migraines.

## 2022-01-28 NOTE — Progress Notes (Unsigned)
[name] [address] [place] [address] Phone: [phone] Subjective:   [name], am serving as a scribe for Dr. Antoine Primas.  I'm seeing this patient by the request  of:  Pincus Sanes, MD  CC: back and neck pain follow up   [id number]  Melanie [name] is a 61 y.o. female coming in with complaint of back and neck pain. OMT on 12/18/2021. Patient states that she has been doing ok. About the same as last visit.   Medications patient has been prescribed: None  Taking:         Reviewed prior external information including notes and imaging from previsou exam, outside providers and external EMR if available.   As well as notes that were available from care everywhere and other healthcare systems.  Past medical history, social, surgical and family history all reviewed in electronic medical record.  No pertanent information unless stated regarding to the chief complaint.   Past Medical History:  Diagnosis Date   Allergy    Asthma    Osteoporosis     Allergies  Allergen Reactions   Flaxseed Oil [Flaxseed (Linseed)] Anaphylaxis   Kiwi Extract Hives, Shortness Of Breath and Itching   Peanut Oil Anaphylaxis, Itching, Palpitations and Shortness Of Breath   Penicillins Hives and Shortness Of Breath   Latex    Sulfonamide Derivatives      Review of Systems:  No headache, visual changes, nausea, vomiting, diarrhea, constipation, dizziness, abdominal pain, skin rash, fevers, chills, night sweats, weight loss, swollen lymph nodes, body aches, joint swelling, chest pain, shortness of breath, mood changes. POSITIVE muscle aches  Objective  Blood pressure 124/82, pulse 85, height 5\' 2"  (1.575 m), weight 175 lb (79.4 kg), SpO2 99 %.   General: No apparent distress alert and oriented x3 mood and affect normal, dressed appropriately.  HEENT: Pupils equal, extraocular movements intact  Respiratory: Patient's speak in full  sentences and does not appear short of breath  Cardiovascular: No lower extremity edema, non tender, no erythema  Gait MSK:  Back mild loss of lordosis.  Tightness with FABER test.  Neck exam does have some limited sidebending bilaterally.  Osteopathic findings  C2 flexed rotated and side bent right C5 flexed rotated and side bent left T3 extended rotated and side bent right inhaled rib T9 extended rotated and side bent left L2 flexed rotated and side bent right Sacrum right on right     Assessment and Plan:  Neck pain Continue tightness noted.  Nothing that is stopping her from her daily activities at this moment.  Discussed with patient icing regimen and home exercises.  Responded extremely well to osteopathic manipulation at this time.  Not taking any medications on a regular basis for this portion.  Follow-up again in 6 to 8 weeks.    Nonallopathic problems  Decision today to treat with OMT was based on Physical Exam  After verbal consent patient was treated with HVLA, ME, FPR techniques in cervical, rib, thoracic, lumbar, and sacral  areas  Patient tolerated the procedure well with improvement in symptoms  Patient given exercises, stretches and lifestyle modifications  See medications in patient instructions if given  Patient will follow up in 4-8 weeks    The above documentation has been reviewed and is accurate and complete , DO          Note: This dictation was prepared with Dragon dictation along with smaller phrase technology. Any  transcriptional errors that result from this process are unintentional.

## 2022-01-29 ENCOUNTER — Ambulatory Visit: Payer: 59 | Admitting: Family Medicine

## 2022-01-29 ENCOUNTER — Encounter: Payer: Self-pay | Admitting: Family Medicine

## 2022-01-29 VITALS — BP 124/82 | HR 85 | Ht 62.0 in | Wt 175.0 lb

## 2022-01-29 DIAGNOSIS — M9901 Segmental and somatic dysfunction of cervical region: Secondary | ICD-10-CM

## 2022-01-29 DIAGNOSIS — M9902 Segmental and somatic dysfunction of thoracic region: Secondary | ICD-10-CM

## 2022-01-29 DIAGNOSIS — M9904 Segmental and somatic dysfunction of sacral region: Secondary | ICD-10-CM | POA: Diagnosis not present

## 2022-01-29 DIAGNOSIS — M9903 Segmental and somatic dysfunction of lumbar region: Secondary | ICD-10-CM

## 2022-01-29 DIAGNOSIS — M9908 Segmental and somatic dysfunction of rib cage: Secondary | ICD-10-CM | POA: Diagnosis not present

## 2022-01-29 DIAGNOSIS — M542 Cervicalgia: Secondary | ICD-10-CM

## 2022-01-29 NOTE — Assessment & Plan Note (Signed)
Continue tightness noted.  Nothing that is stopping her from her daily activities at this moment.  Discussed with patient icing regimen and home exercises.  Responded extremely well to osteopathic manipulation at this time.  Not taking any medications on a regular basis for this portion.  Follow-up again in 6 to 8 weeks.

## 2022-01-29 NOTE — Patient Instructions (Signed)
Stay active Good to see you See me in 6 weeks

## 2022-03-18 ENCOUNTER — Ambulatory Visit: Payer: 59 | Admitting: Family Medicine

## 2022-04-01 NOTE — Progress Notes (Unsigned)
[facility] [place] Phone: [phone] Subjective:   [name], am serving as a scribe for Dr. Hulan Saas.  I'm seeing this patient by the request  of:  Binnie Rail, MD  CC: Back and neck pain  [id number]  Melanie [name] is a 61 y.o. female coming in with complaint of back and neck pain. OMT 01/29/2022. Patient states that her lower back pain has increased. L>R. Feels like she slept wrong. Denies any radiating symptoms.  Patient states that she has been working out on a more regular basis.  Has been weight lifting.  Medications patient has been prescribed: None  Taking:         Reviewed prior external information including notes and imaging from previsou exam, outside providers and external EMR if available.   As well as notes that were available from care everywhere and other healthcare systems.  Past medical history, social, surgical and family history all reviewed in electronic medical record.  No pertanent information unless stated regarding to the chief complaint.   Past Medical History:  Diagnosis Date   Allergy    Asthma    Osteoporosis     Allergies  Allergen Reactions   Flaxseed Oil [Flaxseed (Linseed)] Anaphylaxis   Kiwi Extract Hives, Shortness Of Breath and Itching   Peanut Oil Anaphylaxis, Itching, Palpitations and Shortness Of Breath   Penicillins Hives and Shortness Of Breath   Latex    Sulfonamide Derivatives      Review of Systems:  No headache, visual changes, nausea, vomiting, diarrhea, constipation, dizziness, abdominal pain, skin rash, fevers, chills, night sweats, weight loss, swollen lymph nodes, body aches, joint swelling, chest pain, shortness of breath, mood changes. POSITIVE muscle aches  Objective  Blood pressure 124/74, pulse (!) 58, height 5\' 2"  (1.575 m), weight 174 lb (78.9 kg), SpO2 99 %.   General: No apparent distress alert and oriented x3 mood and  affect normal, dressed appropriately.  HEENT: Pupils equal, extraocular movements intact  Respiratory: Patient's speak in full sentences and does not appear short of breath  Cardiovascular: No lower extremity edema, non tender, no erythema  Gait MSK:  Back   Osteopathic findings  C2 flexed rotated and side bent right C6 flexed rotated and side bent left T4 extended rotated and side bent left inhaled rib L1 flexed rotated and side bent right Sacrum left on left       Assessment and Plan:  Low back pain Some increase in tightness of the lower back.  The patient has been doing weight lifting and discussed the ergonomics and proper lifting mechanics.  Discussed the possibility of some more if she waits for certain exercises.  We discussed icing regimen.  Continuing the vitamin supplementations.  Follow-up again in 6 to 8 weeks.    Nonallopathic problems  Decision today to treat with OMT was based on Physical Exam  After verbal consent patient was treated with HVLA, ME, FPR techniques in cervical, rib, thoracic, lumbar, and sacral  areas  Patient tolerated the procedure well with improvement in symptoms  Patient given exercises, stretches and lifestyle modifications  See medications in patient instructions if given  Patient will follow up in 4-8 weeks     The above documentation has been reviewed and is accurate and complete [name], DO         Note: This dictation was prepared with Dragon dictation along with smaller phrase technology. Any transcriptional  errors that result from this process are unintentional.

## 2022-04-02 ENCOUNTER — Ambulatory Visit: Payer: 59 | Admitting: Family Medicine

## 2022-04-02 VITALS — BP 124/74 | HR 58 | Ht 62.0 in | Wt 174.0 lb

## 2022-04-02 DIAGNOSIS — M9903 Segmental and somatic dysfunction of lumbar region: Secondary | ICD-10-CM | POA: Diagnosis not present

## 2022-04-02 DIAGNOSIS — M9901 Segmental and somatic dysfunction of cervical region: Secondary | ICD-10-CM

## 2022-04-02 DIAGNOSIS — M9904 Segmental and somatic dysfunction of sacral region: Secondary | ICD-10-CM

## 2022-04-02 DIAGNOSIS — M545 Low back pain, unspecified: Secondary | ICD-10-CM | POA: Diagnosis not present

## 2022-04-02 DIAGNOSIS — M9902 Segmental and somatic dysfunction of thoracic region: Secondary | ICD-10-CM

## 2022-04-02 DIAGNOSIS — M9908 Segmental and somatic dysfunction of rib cage: Secondary | ICD-10-CM | POA: Diagnosis not present

## 2022-04-02 NOTE — Patient Instructions (Signed)
Refresh eye drops thru day and Gentle eye drops at night See me in 7 weeks

## 2022-04-02 NOTE — Assessment & Plan Note (Signed)
Some increase in tightness of the lower back.  The patient has been doing weight lifting and discussed the ergonomics and proper lifting mechanics.  Discussed the possibility of some more if she waits for certain exercises.  We discussed icing regimen.  Continuing the vitamin supplementations.  Follow-up again in 6 to 8 weeks.

## 2022-05-21 NOTE — Progress Notes (Signed)
[name] [address] [place] [address] Phone: [phone] Subjective:   Melanie [name], am serving as a scribe for Dr. Antoine Primas.  I'm seeing this patient by the request  of:  Pincus Sanes, MD  CC: Back and neck pain follow-up  [id number]  Melanie [name] is a 61 y.o. female coming in with complaint of back and neck pain. OMT 04/02/2022. Patient states here for routine   Medications patient has been prescribed: None          Reviewed prior external information including notes and imaging from previsou exam, outside providers and external EMR if available.   As well as notes that were available from care everywhere and other healthcare systems.  Past medical history, social, surgical and family history all reviewed in electronic medical record.  No pertanent information unless stated regarding to the chief complaint.   Past Medical History:  Diagnosis Date   Allergy    Asthma    Osteoporosis     Allergies  Allergen Reactions   Flaxseed Oil [Flaxseed (Linseed)] Anaphylaxis   Kiwi Extract Hives, Shortness Of Breath and Itching   Peanut Oil Anaphylaxis, Itching, Palpitations and Shortness Of Breath   Penicillins Hives and Shortness Of Breath   Latex    Sulfonamide Derivatives      Review of Systems:  No  visual changes, nausea, vomiting, diarrhea, constipation, dizziness, abdominal pain, skin rash, fevers, chills, night sweats, weight loss, swollen lymph nodes, body aches, joint swelling, chest pain, shortness of breath, mood changes. POSITIVE muscle aches, headaches, runny nose  Objective  Blood pressure 130/84, pulse 77, height 5\' 2"  (1.575 m), weight 174 lb (78.9 kg), SpO2 99 %.   General: No apparent distress alert and oriented x3 mood and affect normal, dressed appropriately.  HEENT: Pupils equal, extraocular movements intact  Respiratory: Patient's speak in full sentences and does not appear short of  breath  Cardiovascular: No lower extremity edema, non tender, no erythema  Neck exam does have some limitation in sidebending bilaterally.  Does have tightness noted in the occipital region.  Negative Spurling's today.  Low back exam does have tightness more in the thoracolumbar juncture right greater than left.  Osteopathic findings  C7 flexed rotated and side bent left T3 extended rotated and side bent right inhaled rib T8 extended rotated and side bent left L2 flexed rotated and side bent right Sacrum right on right       Assessment and Plan:  Neck pain Neck pain that can cause headaches.  Discussed with patient about icing regimen.  Patient has had difficulty with headaches for quite some time.  Likely osteopathic manipulation has been helpful sometimes.  We discussed if still trying to see if there is any other triggers.  Can take ibuprofen 600 mg if needed the patient tries to avoid this for multiple different reasons.  Follow-up with me again in 6 to 8 weeks    Nonallopathic problems  Decision today to treat with OMT was based on Physical Exam  After verbal consent patient was treated with HVLA, ME, FPR techniques in cervical, rib, thoracic, lumbar, and sacral  areas  Patient tolerated the procedure well with improvement in symptoms  Patient given exercises, stretches and lifestyle modifications  See medications in patient instructions if given  Patient will follow up in 4-8 weeks     The above documentation has been reviewed and is accurate and complete , DO  Note: This dictation was prepared with Dragon dictation along with smaller phrase technology. Any transcriptional errors that result from this process are unintentional.

## 2022-05-23 ENCOUNTER — Ambulatory Visit: Payer: 59 | Admitting: Family Medicine

## 2022-05-23 VITALS — BP 130/84 | HR 77 | Ht 62.0 in | Wt 174.0 lb

## 2022-05-23 DIAGNOSIS — M9902 Segmental and somatic dysfunction of thoracic region: Secondary | ICD-10-CM

## 2022-05-23 DIAGNOSIS — M542 Cervicalgia: Secondary | ICD-10-CM

## 2022-05-23 DIAGNOSIS — M9904 Segmental and somatic dysfunction of sacral region: Secondary | ICD-10-CM

## 2022-05-23 DIAGNOSIS — M9903 Segmental and somatic dysfunction of lumbar region: Secondary | ICD-10-CM | POA: Diagnosis not present

## 2022-05-23 DIAGNOSIS — M9901 Segmental and somatic dysfunction of cervical region: Secondary | ICD-10-CM | POA: Diagnosis not present

## 2022-05-23 DIAGNOSIS — M9908 Segmental and somatic dysfunction of rib cage: Secondary | ICD-10-CM

## 2022-05-23 NOTE — Assessment & Plan Note (Signed)
Neck pain that can cause headaches.  Discussed with patient about icing regimen.  Patient has had difficulty with headaches for quite some time.  Likely osteopathic manipulation has been helpful sometimes.  We discussed if still trying to see if there is any other triggers.  Can take ibuprofen 600 mg if needed the patient tries to avoid this for multiple different reasons.  Follow-up with me again in 6 to 8 weeks

## 2022-05-23 NOTE — Patient Instructions (Addendum)
Good to see you  Lots of crunchies Follow up in 6 weeks

## 2022-07-02 NOTE — Progress Notes (Deleted)
  [place] Phone: [phone] Subjective:    I'm seeing this patient by the request  of:  Binnie Rail, MD  CC:   [id number]  Melanie [name] is a 62 y.o. female coming in with complaint of back and neck pain. OMT on 05/23/2022. Patient states   Medications patient has been prescribed: None  Taking:         Reviewed prior external information including notes and imaging from previsou exam, outside providers and external EMR if available.   As well as notes that were available from care everywhere and other healthcare systems.  Past medical history, social, surgical and family history all reviewed in electronic medical record.  No pertanent information unless stated regarding to the chief complaint.   Past Medical History:  Diagnosis Date   Allergy    Asthma    Osteoporosis     Allergies  Allergen Reactions   Flaxseed Oil [Flaxseed (Linseed)] Anaphylaxis   Kiwi Extract Hives, Shortness Of Breath and Itching   Peanut Oil Anaphylaxis, Itching, Palpitations and Shortness Of Breath   Penicillins Hives and Shortness Of Breath   Latex    Sulfonamide Derivatives      Review of Systems:  No headache, visual changes, nausea, vomiting, diarrhea, constipation, dizziness, abdominal pain, skin rash, fevers, chills, night sweats, weight loss, swollen lymph nodes, body aches, joint swelling, chest pain, shortness of breath, mood changes. POSITIVE muscle aches  Objective  There were no vitals taken for this visit.   General: No apparent distress alert and oriented x3 mood and affect normal, dressed appropriately.  HEENT: Pupils equal, extraocular movements intact  Respiratory: Patient's speak in full sentences and does not appear short of breath  Cardiovascular: No lower extremity edema, non tender, no erythema  Gait MSK:  Back   Osteopathic findings  C2 flexed rotated and side bent right C6  flexed rotated and side bent left T3 extended rotated and side bent right inhaled rib T9 extended rotated and side bent left L2 flexed rotated and side bent right Sacrum right on right       Assessment and Plan:  No problem-specific Assessment & Plan notes found for this encounter.    Nonallopathic problems  Decision today to treat with OMT was based on Physical Exam  After verbal consent patient was treated with HVLA, ME, FPR techniques in cervical, rib, thoracic, lumbar, and sacral  areas  Patient tolerated the procedure well with improvement in symptoms  Patient given exercises, stretches and lifestyle modifications  See medications in patient instructions if given  Patient will follow up in 4-8 weeks             Note: This dictation was prepared with Dragon dictation along with smaller phrase technology. Any transcriptional errors that result from this process are unintentional.

## 2022-07-04 ENCOUNTER — Ambulatory Visit: Payer: Commercial Managed Care - PPO | Admitting: Family Medicine

## 2022-08-06 NOTE — Progress Notes (Signed)
  [place] Phone: [phone] Subjective:   [name], am serving as a scribe for Dr. Hulan Saas.  I'm seeing this patient by the request  of:  Binnie Rail, MD  CC: Multiple joint complaints and headaches  [id number]  Melanie [name] is a 62 y.o. female coming in with complaint of back and neck pain. OMT on 05/23/2022. Patient states patient said that she had some back spasms last week. Is doing better today. Took muscle relaxer which was helpful.           Reviewed prior external information including notes and imaging from previsou exam, outside providers and external EMR if available.   As well as notes that were available from care everywhere and other healthcare systems.  Past medical history, social, surgical and family history all reviewed in electronic medical record.  No pertanent information unless stated regarding to the chief complaint.   Past Medical History:  Diagnosis Date   Allergy    Asthma    Osteoporosis     Allergies  Allergen Reactions   Flaxseed Oil [Flaxseed (Linseed)] Anaphylaxis   Kiwi Extract Hives, Shortness Of Breath and Itching   Peanut Oil Anaphylaxis, Itching, Palpitations and Shortness Of Breath   Penicillins Hives and Shortness Of Breath   Latex    Sulfonamide Derivatives      Review of Systems:  No , visual changes, nausea, vomiting, diarrhea, constipation, dizziness, abdominal pain, skin rash, fevers, chills, night sweats, weight loss, swollen lymph nodes, body aches, joint swelling, chest pain, shortness of breath, mood changes. POSITIVE muscle aches, headache  Objective  Blood pressure 112/88, height 5' 2"$  (1.575 m).   General: No apparent distress alert and oriented x3 mood and affect normal, dressed appropriately.  HEENT: Pupils equal, extraocular movements intact  Respiratory: Patient's speak in full sentences and does not appear short of  breath  Cardiovascular: No lower extremity edema, non tender, no erythema  Patient is back exam does have some mild loss of lordosis.  Some tightness noted in the thoracolumbar juncture left greater than right at the moment.  Osteopathic findings  C6 flexed rotated and side bent left T3 extended rotated and side bent right inhaled rib T7 extended rotated and side bent left L2 flexed rotated and side bent right Sacrum right on right     Assessment and Plan:  Low back pain Mild exacerbation noted.  Discussed icing regimen and home exercises, which activities to do and which ones to avoid.  Tightness with FABER test also noted.  Follow-up again in 6 to 8 weeks    Nonallopathic problems  Decision today to treat with OMT was based on Physical Exam  After verbal consent patient was treated with HVLA, ME, FPR techniques in cervical, rib, thoracic, lumbar, and sacral  areas  Patient tolerated the procedure well with improvement in symptoms  Patient given exercises, stretches and lifestyle modifications  See medications in patient instructions if given  Patient will follow up in 4-8 weeks    The above documentation has been reviewed and is accurate and complete [name], DO          Note: This dictation was prepared with Dragon dictation along with smaller phrase technology. Any transcriptional errors that result from this process are unintentional.

## 2022-08-07 ENCOUNTER — Ambulatory Visit: Payer: Commercial Managed Care - PPO | Admitting: Family Medicine

## 2022-08-07 VITALS — BP 112/88 | Ht 62.0 in

## 2022-08-07 DIAGNOSIS — M545 Low back pain, unspecified: Secondary | ICD-10-CM

## 2022-08-07 DIAGNOSIS — M9908 Segmental and somatic dysfunction of rib cage: Secondary | ICD-10-CM | POA: Diagnosis not present

## 2022-08-07 DIAGNOSIS — M9904 Segmental and somatic dysfunction of sacral region: Secondary | ICD-10-CM | POA: Diagnosis not present

## 2022-08-07 DIAGNOSIS — M9902 Segmental and somatic dysfunction of thoracic region: Secondary | ICD-10-CM

## 2022-08-07 DIAGNOSIS — M9903 Segmental and somatic dysfunction of lumbar region: Secondary | ICD-10-CM

## 2022-08-07 DIAGNOSIS — M542 Cervicalgia: Secondary | ICD-10-CM | POA: Diagnosis not present

## 2022-08-07 DIAGNOSIS — M9901 Segmental and somatic dysfunction of cervical region: Secondary | ICD-10-CM

## 2022-08-07 MED ORDER — TIZANIDINE HCL 4 MG PO TABS: 4.0000 mg | ORAL_TABLET | Freq: Every day | ORAL | 0 refills | Status: DC

## 2022-08-07 NOTE — Assessment & Plan Note (Signed)
Neck does have loss of lordosis does have some loss of lordosis still that we will need to continue to monitor.  Discussed icing regimen.  Refilled Zanaflex follow-up again in 6 to 8 weeks

## 2022-08-07 NOTE — Patient Instructions (Addendum)
Zanaflex called in today Doing great overall See me again in 7-8 weeks

## 2022-08-07 NOTE — Assessment & Plan Note (Signed)
Mild exacerbation noted.  Discussed icing regimen and home exercises, which activities to do and which ones to avoid.  Tightness with FABER test also noted.  Follow-up again in 6 to 8 weeks

## 2022-08-29 ENCOUNTER — Other Ambulatory Visit: Payer: Self-pay | Admitting: Family Medicine

## 2022-09-23 NOTE — Progress Notes (Unsigned)
[name] [address] [place] [address] Phone: [phone] Subjective:   Melanie [name], am serving as a scribe for Dr. Antoine Primas. I'm seeing this patient by the request  of:  Pincus Sanes, MD  CC: back and neck pain   [id number]  Melanie [name] is a 62 y.o. female coming in with complaint of back and neck pain. OMT on 08/07/2022. Patient states that her L hip and lower back are doing well. Now having pain on R side of lumbar and into her R glute. Had to use 1 mm relaxer since last visit.   Medications patient has been prescribed: zanaflex  Taking:         Reviewed prior external information including notes and imaging from previsou exam, outside providers and external EMR if available.   As well as notes that were available from care everywhere and other healthcare systems.  Past medical history, social, surgical and family history all reviewed in electronic medical record.  No pertanent information unless stated regarding to the chief complaint.   Past Medical History:  Diagnosis Date   Allergy    Asthma    Osteoporosis     Allergies  Allergen Reactions   Flaxseed Oil [Flaxseed (Linseed)] Anaphylaxis   Kiwi Extract Hives, Shortness Of Breath and Itching   Peanut Oil Anaphylaxis, Itching, Palpitations and Shortness Of Breath   Penicillins Hives and Shortness Of Breath   Latex    Sulfonamide Derivatives      Review of Systems:  No  visual changes, nausea, vomiting, diarrhea, constipation, dizziness, abdominal pain, skin rash, fevers, chills, night sweats, weight loss, swollen lymph nodes, body aches, joint swelling, chest pain, shortness of breath, mood changes. POSITIVE muscle aches, headache  Objective  Blood pressure 122/74, pulse 64, height  (1.575 m), weight 176 lb (79.8 kg), SpO2 98 %.   General: No apparent distress alert and oriented x3 mood and affect normal, dressed appropriately.  HEENT: Pupils  equal, extraocular movements intact  Respiratory: Patient's speak in full sentences and does not appear short of breath  Cardiovascular: No lower extremity edema, non tender, no erythema  Significant tightness noted of the low back.  Seems to be more on the left sacroiliac joint which is different.  Tightness with Pearlean Brownie left greater than right.  Osteopathic findings  C3 flexed rotated and side bent right C5 flexed rotated and side bent left T3 extended rotated and side bent right inhaled rib T8 extended rotated and side bent left L1 flexed rotated and side bent right L5 flexed rotated and side bent left Sacrum r left on left    Assessment and Plan:  Low back pain Low back exam does have some loss lordosis.  Tenderness to palpation paraspinal musculature. Patient is then to continue work on core strengthening.  Patient has been doing more gardening.  Discussed getting a knealer  Follow-up again in 6 to 8 weeks    Nonallopathic problems  Decision today to treat with OMT was based on Physical Exam  After verbal consent patient was treated with HVLA, ME, FPR techniques in cervical, rib, thoracic, lumbar, and sacral  areas  Patient tolerated the procedure well with improvement in symptoms  Patient given exercises, stretches and lifestyle modifications  See medications in patient instructions if given  Patient will follow up in 4-8 weeks    The above documentation has been reviewed and is accurate and complete Judi Saa, DO  Note: This dictation was prepared with Dragon dictation along with smaller phrase technology. Any transcriptional errors that result from this process are unintentional.

## 2022-09-25 ENCOUNTER — Encounter: Payer: Self-pay | Admitting: Family Medicine

## 2022-09-25 ENCOUNTER — Ambulatory Visit: Payer: Commercial Managed Care - PPO | Admitting: Family Medicine

## 2022-09-25 VITALS — BP 122/74 | HR 64 | Ht 62.0 in | Wt 176.0 lb

## 2022-09-25 DIAGNOSIS — M9908 Segmental and somatic dysfunction of rib cage: Secondary | ICD-10-CM | POA: Diagnosis not present

## 2022-09-25 DIAGNOSIS — M9901 Segmental and somatic dysfunction of cervical region: Secondary | ICD-10-CM

## 2022-09-25 DIAGNOSIS — M9904 Segmental and somatic dysfunction of sacral region: Secondary | ICD-10-CM | POA: Diagnosis not present

## 2022-09-25 DIAGNOSIS — M9903 Segmental and somatic dysfunction of lumbar region: Secondary | ICD-10-CM | POA: Diagnosis not present

## 2022-09-25 DIAGNOSIS — M545 Low back pain, unspecified: Secondary | ICD-10-CM

## 2022-09-25 DIAGNOSIS — M9902 Segmental and somatic dysfunction of thoracic region: Secondary | ICD-10-CM

## 2022-09-25 NOTE — Assessment & Plan Note (Signed)
Low back exam does have some loss lordosis.  Tenderness to palpation paraspinal musculature. Patient is then to continue work on core strengthening.  Patient has been doing more gardening.  Discussed getting a knealer  Follow-up again in 6 to 8 weeks

## 2022-09-25 NOTE — Patient Instructions (Signed)
Piriformis stretches Enjoy the jazz See me in 6-8 weeks

## 2022-11-04 NOTE — Progress Notes (Unsigned)
[name] [address] [place] [address] Phone: [phone] Subjective:   Melanie [name], am serving as a scribe for Dr. Antoine Primas.  I'm seeing this patient by the request  of:  Pincus Sanes, MD  CC: back and neck pain follow up   [id number]  Melanie [name] is a 62 y.o. female coming in with complaint of back and neck pain. OMT on 09/25/2022. Patient states doing okay. Currently is having a migraine. No new concerns.  Medications patient has been prescribed:   Taking:         Reviewed prior external information including notes and imaging from previsou exam, outside providers and external EMR if available.   As well as notes that were available from care everywhere and other healthcare systems.  Past medical history, social, surgical and family history all reviewed in electronic medical record.  No pertanent information unless stated regarding to the chief complaint.   Past Medical History:  Diagnosis Date   Allergy    Asthma    Osteoporosis     Allergies  Allergen Reactions   Flaxseed Oil [Flaxseed (Linseed)] Anaphylaxis   Kiwi Extract Hives, Shortness Of Breath and Itching   Peanut Oil Anaphylaxis, Itching, Palpitations and Shortness Of Breath   Penicillins Hives and Shortness Of Breath   Latex    Sulfonamide Derivatives      Review of Systems:  No , visual changes, nausea, vomiting, diarrhea, constipation, dizziness, abdominal pain, skin rash, fevers, chills, night sweats, weight loss, swollen lymph nodes, body aches, joint swelling, chest pain, shortness of breath, mood changes. POSITIVE muscle aches, headache  Objective  Blood pressure 124/88, pulse 70, height 5\' 2"  (1.575 m), weight 174 lb (78.9 kg), SpO2 98 %.   General: No apparent distress alert and oriented x3 mood and affect normal, dressed appropriately.  HEENT: Pupils equal, extraocular movements intact  Respiratory: Patient's speak in full  sentences and does not appear short of breath  Cardiovascular: No lower extremity edema, non tender, no erythema  MSK:  Back does have some mild loss of lordosis noted.  Manage exam does have some increasing tightness noted.  Moderate to severe in nature.  Osteopathic findings  C2 flexed rotated and side bent right C6 flexed rotated and side bent left C7 flexed rotated and side bent right T3 extended rotated and side bent right inhaled rib T9 extended rotated and side bent left L2 flexed rotated and side bent right Sacrum right on right       Assessment and Plan:  Migraine headache Migraine headache this seems to be an exacerbation secondary to more of the pollen noticed this time.  Discussed with patient about icing regimen and home exercises, discussed which activities to do and which ones to avoid.  We discussed that I do think patient could do well with conservative therapy hopefully this time.  Did respond well to osteopathic manipulation.  No change in medications at this time.  Follow-up again in 6 to 8 weeks.    Nonallopathic problems  Decision today to treat with OMT was based on Physical Exam  After verbal consent patient was treated with HVLA, ME, FPR techniques in cervical, rib, thoracic, lumbar, and sacral  areas  Patient tolerated the procedure well with improvement in symptoms  Patient given exercises, stretches and lifestyle modifications  See medications in patient instructions if given  Patient will follow up in 4-8 weeks    The above documentation has been  reviewed and is accurate and complete Judi Saa, DO          Note: This dictation was prepared with Dragon dictation along with smaller phrase technology. Any transcriptional errors that result from this process are unintentional.

## 2022-11-06 ENCOUNTER — Encounter: Payer: Self-pay | Admitting: Family Medicine

## 2022-11-06 ENCOUNTER — Ambulatory Visit: Payer: Commercial Managed Care - PPO | Admitting: Family Medicine

## 2022-11-06 VITALS — BP 124/88 | HR 70 | Ht 62.0 in | Wt 174.0 lb

## 2022-11-06 DIAGNOSIS — M9903 Segmental and somatic dysfunction of lumbar region: Secondary | ICD-10-CM | POA: Diagnosis not present

## 2022-11-06 DIAGNOSIS — M9901 Segmental and somatic dysfunction of cervical region: Secondary | ICD-10-CM

## 2022-11-06 DIAGNOSIS — G43701 Chronic migraine without aura, not intractable, with status migrainosus: Secondary | ICD-10-CM | POA: Diagnosis not present

## 2022-11-06 DIAGNOSIS — M9908 Segmental and somatic dysfunction of rib cage: Secondary | ICD-10-CM

## 2022-11-06 DIAGNOSIS — M9904 Segmental and somatic dysfunction of sacral region: Secondary | ICD-10-CM

## 2022-11-06 DIAGNOSIS — M9902 Segmental and somatic dysfunction of thoracic region: Secondary | ICD-10-CM

## 2022-11-06 NOTE — Patient Instructions (Signed)
Thanks for the recommendation for the app Keep enjoying garden Keep working out See you again in 7-8 weeks

## 2022-11-06 NOTE — Assessment & Plan Note (Signed)
Migraine headache this seems to be an exacerbation secondary to more of the pollen noticed this time.  Discussed with patient about icing regimen and home exercises, discussed which activities to do and which ones to avoid.  We discussed that I do think patient could do well with conservative therapy hopefully this time.  Did respond well to osteopathic manipulation.  No change in medications at this time.  Follow-up again in 6 to 8 weeks.

## 2022-12-19 NOTE — Progress Notes (Signed)
[name] [address] [place] [address] Phone: [phone] Subjective:   [name], am serving as a scribe for Dr. Antoine Primas.  I'm seeing this patient by the request  of:  Pincus Sanes, MD  CC: Back and neck pain follow-up  [id number]  Melanie [name] is a 62 y.o. female coming in with complaint of back and neck pain. OMT on 11/06/2022. Patient states that she has been about the same as last visit.  Has been having more migraines recently, thinks it is may be potentially more of the weather.  Has had this difficulty for quite some time.  Patient describes the pain is unrelenting.  He denies any nausea or vomiting at the moment.       Reviewed prior external information including notes and imaging from previsou exam, outside providers and external EMR if available.   As well as notes that were available from care everywhere and other healthcare systems.  Past medical history, social, surgical and family history all reviewed in electronic medical record.  No pertanent information unless stated regarding to the chief complaint.   Past Medical History:  Diagnosis Date   Allergy    Asthma    Osteoporosis     Allergies  Allergen Reactions   Flaxseed Oil [Flaxseed (Linseed)] Anaphylaxis   Kiwi Extract Hives, Shortness Of Breath and Itching   Peanut Oil Anaphylaxis, Itching, Palpitations and Shortness Of Breath   Penicillins Hives and Shortness Of Breath   Latex    Sulfonamide Derivatives      Review of Systems:  No  visual changes, nausea, vomiting, diarrhea, constipation, dizziness, abdominal pain, skin rash, fevers, chills, night sweats, weight loss, swollen lymph nodes, body aches, joint swelling, chest pain, shortness of breath, mood changes. POSITIVE muscle aches, headache  Objective  Blood pressure 118/86, pulse (!) 103, height 5\' 2"  (1.575 m), SpO2 97%.   General: No apparent distress alert and oriented x3 mood  and affect normal, dressed appropriately.  HEENT: Pupils equal, extraocular movements intact  Respiratory: Patient's speak in full sentences and does not appear short of breath  Cardiovascular: No lower extremity edema, non tender, no erythema  Neck exam does have some mild loss of lordosis noted.  Some tenderness to palpation in the paraspinal musculature.  Osteopathic findings  C2 flexed rotated and side bent right C3 flexed rotated and side bent left C7 flexed rotated and side bent left T3 extended rotated and side bent left inhaled rib T8 extended rotated and side bent left L2 flexed rotated and side bent right Sacrum right on right    Assessment and Plan:  Migraine headache Patient unfortunately continues to have worsening symptoms with her migraines.  Will start her on Effexor.  Warned of potential side effects.  Hopeful that patient can make some improvement.  Discussed which activities to do and which ones to avoid.  Increase activity slowly over the course of next several weeks.  Follow-up with me again in 5 weeks    Nonallopathic problems  Decision today to treat with OMT was based on Physical Exam  After verbal consent patient was treated with HVLA, ME, FPR techniques in cervical, rib, thoracic, lumbar, and sacral  areas  Patient tolerated the procedure well with improvement in symptoms  Patient given exercises, stretches and lifestyle modifications  See medications in patient instructions if given  Patient will follow up in 4-8 weeks     The above documentation has been reviewed  and is accurate and complete Judi Saa, DO         Note: This dictation was prepared with Dragon dictation along with smaller phrase technology. Any transcriptional errors that result from this process are unintentional.

## 2022-12-25 ENCOUNTER — Encounter: Payer: Self-pay | Admitting: Family Medicine

## 2022-12-25 ENCOUNTER — Other Ambulatory Visit (HOSPITAL_COMMUNITY): Payer: Self-pay

## 2022-12-25 ENCOUNTER — Ambulatory Visit: Payer: Commercial Managed Care - PPO | Admitting: Family Medicine

## 2022-12-25 VITALS — BP 118/86 | HR 103 | Ht 62.0 in

## 2022-12-25 DIAGNOSIS — G43701 Chronic migraine without aura, not intractable, with status migrainosus: Secondary | ICD-10-CM

## 2022-12-25 DIAGNOSIS — M9904 Segmental and somatic dysfunction of sacral region: Secondary | ICD-10-CM

## 2022-12-25 DIAGNOSIS — M9903 Segmental and somatic dysfunction of lumbar region: Secondary | ICD-10-CM | POA: Diagnosis not present

## 2022-12-25 DIAGNOSIS — M9902 Segmental and somatic dysfunction of thoracic region: Secondary | ICD-10-CM | POA: Diagnosis not present

## 2022-12-25 DIAGNOSIS — M9901 Segmental and somatic dysfunction of cervical region: Secondary | ICD-10-CM | POA: Diagnosis not present

## 2022-12-25 DIAGNOSIS — M9908 Segmental and somatic dysfunction of rib cage: Secondary | ICD-10-CM

## 2022-12-25 MED ORDER — VENLAFAXINE HCL ER 37.5 MG PO CP24
37.5000 mg | ORAL_CAPSULE | Freq: Every day | ORAL | 0 refills | Status: AC
  Filled 2022-12-25: qty 30, 30d supply, fill #0

## 2022-12-25 NOTE — Assessment & Plan Note (Signed)
Patient unfortunately continues to have worsening symptoms with her migraines.  Will start her on Effexor.  Warned of potential side effects.  Hopeful that patient can make some improvement.  Discussed which activities to do and which ones to avoid.  Increase activity slowly over the course of next several weeks.  Follow-up with me again in 5 weeks

## 2022-12-25 NOTE — Patient Instructions (Signed)
Good to see you Effexor 37.5mg   See me again in 5-6 weeks

## 2023-01-09 ENCOUNTER — Other Ambulatory Visit (HOSPITAL_COMMUNITY): Payer: Self-pay

## 2023-02-03 NOTE — Progress Notes (Unsigned)
[name] [address] [place] [address] Phone: [phone] Subjective:   INadine Counts, am serving as a scribe for Dr. Antoine Primas.  I'm seeing this patient by the request  of:  Pincus Sanes, MD  CC: Back and neck pain follow-up  [id number]  Melanie [name] is a 62 y.o. female coming in with complaint of back and neck pain. OMT on 12/25/2022. Patient states same per usual. No new concerns.  Medications patient has been prescribed: Effexor  Taking: Yes         Reviewed prior external information including notes and imaging from previsou exam, outside providers and external EMR if available.   As well as notes that were available from care everywhere and other healthcare systems.  Past medical history, social, surgical and family history all reviewed in electronic medical record.  No pertanent information unless stated regarding to the chief complaint.   Past Medical History:  Diagnosis Date   Allergy    Asthma    Osteoporosis     Allergies  Allergen Reactions   Flaxseed Oil [Flaxseed (Linseed)] Anaphylaxis   Kiwi Extract Hives, Shortness Of Breath and Itching   Peanut Oil Anaphylaxis, Itching, Palpitations and Shortness Of Breath   Penicillins Hives and Shortness Of Breath   Latex    Sulfonamide Derivatives      Review of Systems:  No  visual changes, nausea, vomiting, diarrhea, constipation, dizziness, abdominal pain, skin rash, fevers, chills, night sweats, weight loss, swollen lymph nodes, body aches, joint swelling, chest pain, shortness of breath, mood changes. POSITIVE muscle aches, headache  Objective  Blood pressure 122/84, pulse 76, height 5\' 2"  (1.575 m), weight 173 lb (78.5 kg), SpO2 99%.   General: No apparent distress alert and oriented x3 mood and affect normal, dressed appropriately.  HEENT: Pupils equal, extraocular movements intact  Respiratory: Patient's speak in full sentences and does not  appear short of breath  Cardiovascular: No lower extremity edema, non tender, no erythema  More tension noted in the parascapular area.  Seems to be more on the right side than the left.  Does have some limited sidebending of the neck noted.  Osteopathic findings  C2 flexed rotated and side bent right C5 flexed rotated and side bent C6 flexed rotated and side bent left T5 extended rotated and side bent right inhaled rib T9 extended rotated and side bent left L2 flexed rotated and side bent right L3 flexed rotated and side bent left Sacrum right on right    Assessment and Plan:  Low back pain Seem to have more discomfort over the lower back than usual.  Discussed icing regimen and home exercises, discussed which activities to do and which ones do not avoid.  Patient has had some increasing in stress recently as well that likely contributed to some of the increase in discomfort and pain as well.  Discussed home exercises and icing regimen.  Follow-up with me again in 6 to 8 weeks otherwise.    Nonallopathic problems  Decision today to treat with OMT was based on Physical Exam  After verbal consent patient was treated with HVLA, ME, FPR techniques in cervical, rib, thoracic, lumbar, and sacral  areas  Patient tolerated the procedure well with improvement in symptoms  Patient given exercises, stretches and lifestyle modifications  See medications in patient instructions if given  Patient will follow up in 4-8 weeks      The above documentation has been reviewed and  is accurate and complete Judi Saa, DO        Note: This dictation was prepared with Dragon dictation along with smaller phrase technology. Any transcriptional errors that result from this process are unintentional.

## 2023-02-04 ENCOUNTER — Encounter: Payer: Self-pay | Admitting: Family Medicine

## 2023-02-04 ENCOUNTER — Ambulatory Visit: Payer: Commercial Managed Care - PPO | Admitting: Family Medicine

## 2023-02-04 VITALS — BP 122/84 | HR 76 | Ht 62.0 in | Wt 173.0 lb

## 2023-02-04 DIAGNOSIS — M9908 Segmental and somatic dysfunction of rib cage: Secondary | ICD-10-CM | POA: Diagnosis not present

## 2023-02-04 DIAGNOSIS — M9902 Segmental and somatic dysfunction of thoracic region: Secondary | ICD-10-CM | POA: Diagnosis not present

## 2023-02-04 DIAGNOSIS — M9901 Segmental and somatic dysfunction of cervical region: Secondary | ICD-10-CM | POA: Diagnosis not present

## 2023-02-04 DIAGNOSIS — M9903 Segmental and somatic dysfunction of lumbar region: Secondary | ICD-10-CM | POA: Diagnosis not present

## 2023-02-04 DIAGNOSIS — M545 Low back pain, unspecified: Secondary | ICD-10-CM

## 2023-02-04 DIAGNOSIS — M9904 Segmental and somatic dysfunction of sacral region: Secondary | ICD-10-CM

## 2023-02-04 NOTE — Patient Instructions (Signed)
Good to see you! Keep working on strength See you again in 6-8 weeks

## 2023-02-04 NOTE — Assessment & Plan Note (Signed)
Seem to have more discomfort over the lower back than usual.  Discussed icing regimen and home exercises, discussed which activities to do and which ones do not avoid.  Patient has had some increasing in stress recently as well that likely contributed to some of the increase in discomfort and pain as well.  Discussed home exercises and icing regimen.  Follow-up with me again in 6 to 8 weeks otherwise.

## 2023-03-19 ENCOUNTER — Ambulatory Visit: Payer: Commercial Managed Care - PPO | Admitting: Family Medicine

## 2023-03-31 NOTE — Progress Notes (Unsigned)
[name] [address] [place] [address] Phone: [phone] Subjective:   INadine Counts, am serving as a scribe for Dr. Antoine Primas.  I'm seeing this patient by the request  of:  Pincus Sanes, MD  CC: back and neck pain follow up   [id number]  Melanie [name] is a 62 y.o. female coming in with complaint of back and neck pain. OMT 02/04/2023.  Patient states same per usual. No new concerns.  Medications patient has been prescribed: Effexor  Taking:         Reviewed prior external information including notes and imaging from previsou exam, outside providers and external EMR if available.   As well as notes that were available from care everywhere and other healthcare systems.  Past medical history, social, surgical and family history all reviewed in electronic medical record.  No pertanent information unless stated regarding to the chief complaint.   Past Medical History:  Diagnosis Date   Allergy    Asthma    Osteoporosis     Allergies  Allergen Reactions   Flaxseed Oil [Flaxseed (Linseed)] Anaphylaxis   Kiwi Extract Hives, Shortness Of Breath and Itching   Peanut Oil Anaphylaxis, Itching, Palpitations and Shortness Of Breath   Penicillins Hives and Shortness Of Breath   Latex    Sulfonamide Derivatives      Review of Systems:  No  visual changes, nausea, vomiting, diarrhea, constipation, dizziness, abdominal pain, skin rash, fevers, chills, night sweats, weight loss, swollen lymph nodes, body aches, joint swelling, chest pain, shortness of breath, mood changes. POSITIVE muscle aches, headache   Objective  Blood pressure 122/86, height 5\' 2"  (1.575 m), weight 174 lb (78.9 kg).   General: No apparent distress alert and oriented x3 mood and affect normal, dressed appropriately.  HEENT: Pupils equal, extraocular movements intact  Respiratory: Patient's speak in full sentences and does not appear short of breath   Cardiovascular: No lower extremity edema, non tender, no erythema  MSK:  Neck exam shows patient does have some loss lordosis noted.  There are some tenderness to palpation in the paraspinal musculature.  Back exam shows tightness noted in the thoracolumbar juncture.  Seems to be somewhat more than usual.  Osteopathic findings  C2 flexed rotated and side bent right C7 flexed rotated and side bent left T3 extended rotated and side bent right inhaled rib T8 extended rotated and side bent left L3 flexed rotated and side bent right Sacrum right on right    Assessment and Plan:  Neck pain Chronic problem with some cervicogenic headaches and migraines.  Discussed with patient about icing regimen and home exercises, discussed which activities to do and which ones to avoid.  Increase activity slowly.  Follow-up with me again in 6 to 8 weeks otherwise.  Polyarthralgia Doing relatively well with the Effexor at the moment.  No other significant side effects.    Nonallopathic problems  Decision today to treat with OMT was based on Physical Exam  After verbal consent patient was treated with HVLA, ME, FPR techniques in cervical, rib, thoracic, lumbar, and sacral  areas  Patient tolerated the procedure well with improvement in symptoms  Patient given exercises, stretches and lifestyle modifications  See medications in patient instructions if given  Patient will follow up in 4-8 weeks    The above documentation has been reviewed and is accurate and complete Judi Saa, DO          Note:  This dictation was prepared with Dragon dictation along with smaller phrase technology. Any transcriptional errors that result from this process are unintentional.

## 2023-04-01 ENCOUNTER — Ambulatory Visit: Payer: Commercial Managed Care - PPO | Admitting: Family Medicine

## 2023-04-01 ENCOUNTER — Encounter: Payer: Self-pay | Admitting: Family Medicine

## 2023-04-01 VITALS — BP 122/86 | Ht 62.0 in | Wt 174.0 lb

## 2023-04-01 DIAGNOSIS — M9903 Segmental and somatic dysfunction of lumbar region: Secondary | ICD-10-CM | POA: Diagnosis not present

## 2023-04-01 DIAGNOSIS — M9902 Segmental and somatic dysfunction of thoracic region: Secondary | ICD-10-CM | POA: Diagnosis not present

## 2023-04-01 DIAGNOSIS — M2550 Pain in unspecified joint: Secondary | ICD-10-CM

## 2023-04-01 DIAGNOSIS — M9901 Segmental and somatic dysfunction of cervical region: Secondary | ICD-10-CM | POA: Diagnosis not present

## 2023-04-01 DIAGNOSIS — M9904 Segmental and somatic dysfunction of sacral region: Secondary | ICD-10-CM

## 2023-04-01 DIAGNOSIS — M9908 Segmental and somatic dysfunction of rib cage: Secondary | ICD-10-CM | POA: Diagnosis not present

## 2023-04-01 DIAGNOSIS — M542 Cervicalgia: Secondary | ICD-10-CM

## 2023-04-01 NOTE — Assessment & Plan Note (Signed)
Chronic problem with some cervicogenic headaches and migraines.  Discussed with patient about icing regimen and home exercises, discussed which activities to do and which ones to avoid.  Increase activity slowly.  Follow-up with me again in 6 to 8 weeks otherwise.

## 2023-04-01 NOTE — Assessment & Plan Note (Signed)
Doing relatively well with the Effexor at the moment.  No other significant side effects.

## 2023-04-01 NOTE — Patient Instructions (Signed)
Good to see you! See you again in 6 weeks [facility]

## 2023-05-14 ENCOUNTER — Ambulatory Visit: Payer: Commercial Managed Care - PPO | Admitting: Family Medicine

## 2023-05-17 ENCOUNTER — Telehealth: Payer: Commercial Managed Care - PPO | Admitting: Family Medicine

## 2023-05-17 DIAGNOSIS — J4540 Moderate persistent asthma, uncomplicated: Secondary | ICD-10-CM | POA: Diagnosis not present

## 2023-05-17 DIAGNOSIS — B9689 Other specified bacterial agents as the cause of diseases classified elsewhere: Secondary | ICD-10-CM | POA: Diagnosis not present

## 2023-05-17 DIAGNOSIS — J019 Acute sinusitis, unspecified: Secondary | ICD-10-CM

## 2023-05-17 MED ORDER — BENZONATATE 200 MG PO CAPS: 200.0000 mg | ORAL_CAPSULE | Freq: Two times a day (BID) | ORAL | 0 refills | Status: AC | PRN

## 2023-05-17 MED ORDER — AZITHROMYCIN 250 MG PO TABS
ORAL_TABLET | ORAL | 0 refills | Status: AC
Start: 2023-05-17 — End: 2023-05-22

## 2023-05-17 NOTE — Progress Notes (Signed)

## 2023-05-22 NOTE — Progress Notes (Unsigned)
[name] [address] [place] [address] Phone: [phone] Subjective:   INadine Counts, am serving as a scribe for Dr. Antoine Primas.  I'm seeing this patient by the request  of:  Pincus Sanes, MD  CC: Back and neck pain follow-up  [id number]  Melanie [name] is a 62 y.o. female coming in with complaint of back and neck pain. OMT 04/01/2023. Patient states same per usual. No new concerns.  Medications patient has been prescribed: None  Taking:         Reviewed prior external information including notes and imaging from previsou exam, outside providers and external EMR if available.   As well as notes that were available from care everywhere and other healthcare systems.  Past medical history, social, surgical and family history all reviewed in electronic medical record.  No pertanent information unless stated regarding to the chief complaint.   Past Medical History:  Diagnosis Date   Allergy    Asthma    Osteoporosis     Allergies  Allergen Reactions   Flaxseed Oil [Flaxseed (Linseed)] Anaphylaxis   Kiwi Extract Hives, Shortness Of Breath and Itching   Peanut Oil Anaphylaxis, Itching, Palpitations and Shortness Of Breath   Penicillins Hives and Shortness Of Breath   Latex    Sulfonamide Derivatives      Review of Systems:  No  visual changes, nausea, vomiting, diarrhea, constipation, dizziness, abdominal pain, skin rash, fevers, chills, night sweats, weight loss, swollen lymph nodes, body aches, joint swelling, chest pain, shortness of breath, mood changes. POSITIVE muscle aches, headache  Objective  Blood pressure 124/80, height 5\' 2"  (1.575 m), weight 177 lb (80.3 kg).   General: No apparent distress alert and oriented x3 mood and affect normal, dressed appropriately.  HEENT: Pupils equal, extraocular movements intact  Respiratory: Patient's speak in full sentences and does not appear short of breath   Cardiovascular: No lower extremity edema, non tender, no erythema  MSK:  Back does have some loss lordosis noted  Neck exam does have some limited sidebending bilaterally.  Seems to be right greater than left.  Osteopathic findings  C2 flexed rotated and side bent right C3 flexed rotated and side bent left T3 extended rotated and side bent right inhaled rib T9 extended rotated and side bent left L2 flexed rotated and side bent right Sacrum right on right    Assessment and Plan:  Neck pain Neck exam does have some loss of lordosis.  Some cervicogenic headaches.  Polyarthralgia seems to be relatively stable at the moment.  We will continue to monitor.  Discussed icing regimen.  Discussed avoiding certain activities that could cause some worsening symptoms.  Will follow-up with me again in 6 to 8 weeks.    Nonallopathic problems  Decision today to treat with OMT was based on Physical Exam  After verbal consent patient was treated with HVLA, ME, FPR techniques in cervical, rib, thoracic, lumbar, and sacral  areas  Patient tolerated the procedure well with improvement in symptoms  Patient given exercises, stretches and lifestyle modifications  See medications in patient instructions if given  Patient will follow up in 4-8 weeks    The above documentation has been reviewed and is accurate and complete Judi Saa, DO          Note: This dictation was prepared with Dragon dictation along with smaller phrase technology. Any transcriptional errors that result from this process are unintentional.

## 2023-05-25 ENCOUNTER — Ambulatory Visit: Payer: Commercial Managed Care - PPO | Admitting: Family Medicine

## 2023-05-25 ENCOUNTER — Encounter: Payer: Self-pay | Admitting: Family Medicine

## 2023-05-25 VITALS — BP 124/80 | Ht 62.0 in | Wt 177.0 lb

## 2023-05-25 DIAGNOSIS — M9908 Segmental and somatic dysfunction of rib cage: Secondary | ICD-10-CM

## 2023-05-25 DIAGNOSIS — M9903 Segmental and somatic dysfunction of lumbar region: Secondary | ICD-10-CM

## 2023-05-25 DIAGNOSIS — M9904 Segmental and somatic dysfunction of sacral region: Secondary | ICD-10-CM

## 2023-05-25 DIAGNOSIS — M542 Cervicalgia: Secondary | ICD-10-CM | POA: Diagnosis not present

## 2023-05-25 DIAGNOSIS — M9901 Segmental and somatic dysfunction of cervical region: Secondary | ICD-10-CM | POA: Diagnosis not present

## 2023-05-25 DIAGNOSIS — M9902 Segmental and somatic dysfunction of thoracic region: Secondary | ICD-10-CM

## 2023-05-25 NOTE — Patient Instructions (Signed)
Great to see you Have fun with sisters  2 month follow up

## 2023-05-25 NOTE — Assessment & Plan Note (Signed)
Neck exam does have some loss of lordosis.  Some cervicogenic headaches.  Polyarthralgia seems to be relatively stable at the moment.  We will continue to monitor.  Discussed icing regimen.  Discussed avoiding certain activities that could cause some worsening symptoms.  Will follow-up with me again in 6 to 8 weeks.

## 2023-06-28 ENCOUNTER — Telehealth: Payer: Commercial Managed Care - PPO | Admitting: Physician Assistant

## 2023-06-28 DIAGNOSIS — J069 Acute upper respiratory infection, unspecified: Secondary | ICD-10-CM

## 2023-06-28 DIAGNOSIS — H1132 Conjunctival hemorrhage, left eye: Secondary | ICD-10-CM | POA: Diagnosis not present

## 2023-06-28 DIAGNOSIS — H5712 Ocular pain, left eye: Secondary | ICD-10-CM

## 2023-06-28 NOTE — Patient Instructions (Signed)
  Rhilee D Hallums, thank you for joining Laure Kidney, PA-C for today's virtual visit.  While this provider is not your primary care provider (PCP), if your PCP is located in our provider database this encounter information will be shared with them immediately following your visit.   A [facility] MyChart account gives you access to today's visit and all your visits, tests, and labs performed at [facility] " click here if you don't have a [facility] MyChart account or go to [email or web address]  Consent: (Patient) Melanie [name] provided verbal consent for this virtual visit at the beginning of the encounter.  Current Medications:  Current Outpatient Medications:    albuterol (PROAIR HFA) 108 (90 Base) MCG/ACT inhaler, Inhale 1 puff into the lungs every 6 (six) hours as needed for wheezing or shortness of breath., Disp: 1 each, Rfl: 8   benzonatate (TESSALON) 100 MG capsule, Take 1 capsule (100 mg total) by mouth 2 (two) times daily as needed for cough., Disp: 20 capsule, Rfl: 0   benzonatate (TESSALON) 200 MG capsule, Take 1 capsule (200 mg total) by mouth 2 (two) times daily as needed for cough., Disp: 20 capsule, Rfl: 0   cholecalciferol (VITAMIN D) 1000 units tablet, Take 2,000 Units by mouth daily., Disp: , Rfl:    fluticasone (FLONASE) 50 MCG/ACT nasal spray, Place 2 sprays into both nostrils daily., Disp: 16 g, Rfl: 6   fluticasone (FLOVENT HFA) 110 MCG/ACT inhaler, Inhale 1 puff into the lungs 2 (two) times daily., Disp: 1 each, Rfl: 8   Multiple Vitamin (MULTIVITAMIN) tablet, Take 1 tablet by mouth daily., Disp: , Rfl:    Probiotic Product (PROBIOTIC DAILY PO), Take by mouth., Disp: , Rfl:    rizatriptan (MAXALT-MLT) 10 MG disintegrating tablet, Take 1 tablet (10 mg total) by mouth as needed for migraine. May repeat in 2 hours if needed, Disp: 10 tablet, Rfl: 3   tiZANidine (ZANAFLEX) 4 MG tablet, TAKE 1 TABLET BY MOUTH AT BEDTIME., Disp: 90 tablet, Rfl: 1    venlafaxine XR (EFFEXOR XR) 37.5 MG 24 hr capsule, Take 1 capsule (37.5 mg total) by mouth daily with breakfast., Disp: 30 capsule, Rfl: 0   Medications ordered in this encounter:  No orders of the defined types were placed in this encounter.    *If you need refills on other medications prior to your next appointment, please contact your pharmacy*  Follow-Up: Call back or seek an in-person evaluation if the symptoms worsen or if the condition fails to improve as anticipated.  [facility] [phone]  Other Instructions Report to Urgent Care for evaluation.    If you have been instructed to have an in-person evaluation today at a local Urgent Care facility, please use the link below. It will take you to a list of all of our available [facility], including address, phone number and hours of operation. Please do not delay care.  [facility]  If you or a family member do not have a primary care provider, use the link below to schedule a visit and establish care. When you choose a [facility] primary care physician or advanced practice provider, you gain a long-term partner in health. Find a Primary Care Provider  Learn more about [facility]'s in-office and virtual care options:  - Get Care Now

## 2023-06-28 NOTE — Progress Notes (Signed)
Virtual Visit Consent   Melanie [name], you are scheduled for a virtual visit with a [facility] provider today. Just as with appointments in the office, your consent must be obtained to participate. Your consent will be active for this visit and any virtual visit you may have with one of our providers in the next 365 days. If you have a MyChart account, a copy of this consent can be sent to you electronically.  As this is a virtual visit, video technology does not allow for your provider to perform a traditional examination. This may limit your provider's ability to fully assess your condition. If your provider identifies any concerns that need to be evaluated in person or the need to arrange testing (such as labs, EKG, etc.), we will make arrangements to do so. Although advances in technology are sophisticated, we cannot ensure that it will always work on either your end or our end. If the connection with a video visit is poor, the visit may have to be switched to a telephone visit. With either a video or telephone visit, we are not always able to ensure that we have a secure connection.  By engaging in this virtual visit, you consent to the provision of healthcare and authorize for your insurance to be billed (if applicable) for the services provided during this visit. Depending on your insurance coverage, you may receive a charge related to this service.  I need to obtain your verbal consent now. Are you willing to proceed with your visit today? Melanie [name] has provided verbal consent on 06/28/2023 for a virtual visit (video or telephone). Laure Kidney, [place]  Date: 06/28/2023 12:27 PM  Virtual Visit via Video Note   I, Laure Kidney, connected with  Melanie [name]  ([id number], Nov 27, 1960) on 06/28/23 at 12:15 PM EST by a video-enabled telemedicine application and verified that I am speaking with the correct person using two identifiers.  Location: Patient: Virtual Visit Location  Patient: Home Provider: Virtual Visit Location Provider: Home Office   I discussed the limitations of evaluation and management by telemedicine and the availability of in person appointments. The patient expressed understanding and agreed to proceed.    History of Present Illness: Melanie [name] is a 63 y.o. who identifies as a female who was assigned female at birth, and is being seen today for left eye pain and uri.  HPI: Eye Pain  The left eye is affected. This is a new problem. The current episode started today. The problem occurs constantly. The injury mechanism was a direct trauma. The pain is mild. There is No known exposure to pink eye. Associated symptoms include eye redness and itching. She has tried nothing for the symptoms.    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 12/21/2019   Low back pain 11/18/2019   Mild intermittent asthma without complication 07/08/2018   Nonallopathic lesion of rib cage 01/28/2018   Nonallopathic lesion of sacral region 12/24/2017   Greater trochanteric bursitis, right 06/25/2017   Migraine headache 02/17/2017   Intercostal pain 02/03/2017   Polyarthralgia 08/23/2015   Neck pain 07/07/2014   Concussion with no loss of consciousness 07/07/2014   Nonallopathic lesion of lumbosacral region 11/04/2013   Nonallopathic lesion of thoracic region 11/04/2013   Piriformis syndrome of left side 10/14/2013   Greater trochanteric bursitis of left hip 10/14/2013   Nonallopathic lesion of cervicothoracic region 11/26/2012  Backache 09/26/2009   OTHER ACQUIRED DEFORMITY OF ANKLE AND FOOT OTHER 09/26/2009   KNEE PAIN, RIGHT 08/08/2009    Allergies:  Allergies  Allergen Reactions   Flaxseed Oil [Flaxseed (Linseed)] Anaphylaxis   Kiwi Extract Hives, Shortness Of Breath and Itching   Peanut Oil Anaphylaxis, Itching, Palpitations and Shortness Of Breath    Penicillins Hives and Shortness Of Breath   Latex    Sulfonamide Derivatives    Medications:  Current Outpatient Medications:    albuterol (PROAIR HFA) 108 (90 Base) MCG/ACT inhaler, Inhale 1 puff into the lungs every 6 (six) hours as needed for wheezing or shortness of breath., Disp: 1 each, Rfl: 8   benzonatate (TESSALON) 100 MG capsule, Take 1 capsule (100 mg total) by mouth 2 (two) times daily as needed for cough., Disp: 20 capsule, Rfl: 0   benzonatate (TESSALON) 200 MG capsule, Take 1 capsule (200 mg total) by mouth 2 (two) times daily as needed for cough., Disp: 20 capsule, Rfl: 0   cholecalciferol (VITAMIN D) 1000 units tablet, Take 2,000 Units by mouth daily., Disp: , Rfl:    fluticasone (FLONASE) 50 MCG/ACT nasal spray, Place 2 sprays into both nostrils daily., Disp: 16 g, Rfl: 6   fluticasone (FLOVENT HFA) 110 MCG/ACT inhaler, Inhale 1 puff into the lungs 2 (two) times daily., Disp: 1 each, Rfl: 8   Multiple Vitamin (MULTIVITAMIN) tablet, Take 1 tablet by mouth daily., Disp: , Rfl:    Probiotic Product (PROBIOTIC DAILY PO), Take by mouth., Disp: , Rfl:    rizatriptan (MAXALT-MLT) 10 MG disintegrating tablet, Take 1 tablet (10 mg total) by mouth as needed for migraine. May repeat in 2 hours if needed, Disp: 10 tablet, Rfl: 3   tiZANidine (ZANAFLEX) 4 MG tablet, TAKE 1 TABLET BY MOUTH AT BEDTIME., Disp: 90 tablet, Rfl: 1   venlafaxine XR (EFFEXOR XR) 37.5 MG 24 hr capsule, Take 1 capsule (37.5 mg total) by mouth daily with breakfast., Disp: 30 capsule, Rfl: 0  Observations/Objective: Patient is well-developed, well-nourished in no acute distress.  Resting comfortably  at home.  Head is normocephalic, atraumatic.  No labored breathing.  Speech is clear and coherent with logical content.  Patient is alert and oriented at baseline.  Scleral injection left eye   Assessment and Plan: 1. Acute left eye pain (Primary)  Patient presenting with left eye pain and scleral injection.  Concerned for possible corneal abrasion vs foreign body however recommend UC evaluation for fluorescein staining and visual acuity. Patient verbalized understanding and to report to UC for evaluation,  Follow Up Instructions: I discussed the assessment and treatment plan with the patient. The patient was provided an opportunity to ask questions and all were answered. The patient agreed with the plan and demonstrated an understanding of the instructions.  A copy of instructions were sent to the patient via MyChart unless otherwise noted below.     The patient was advised to call back or seek an in-person evaluation if the symptoms worsen or if the condition fails to improve as anticipated.    Laure Kidney, PA-C

## 2023-07-28 NOTE — Progress Notes (Unsigned)
 [name] [address] [place] [address] Phone: [phone] Subjective:   [name], am serving as a scribe for Dr. Antoine Primas.  I'm seeing this patient by the request  of:  Pincus Sanes, MD  CC: neck and back pain f/u   [id number]  Melanie [name] is a 63 y.o. female coming in with complaint of back and neck pain. OMT 05/25/2023. Patient states that she has been doing well. Migraines due to weather changes recently.  Medications patient has been prescribed: Effexor  Taking: yes          Reviewed prior external information including notes and imaging from previsou exam, outside providers and external EMR if available.   As well as notes that were available from care everywhere and other healthcare systems.  Past medical history, social, surgical and family history all reviewed in electronic medical record.  No pertanent information unless stated regarding to the chief complaint.   Past Medical History:  Diagnosis Date   Allergy    Asthma    Osteoporosis     Allergies  Allergen Reactions   Flaxseed Oil [Flaxseed (Linseed)] Anaphylaxis   Kiwi Extract Hives, Shortness Of Breath and Itching   Peanut Oil Anaphylaxis, Itching, Palpitations and Shortness Of Breath   Penicillins Hives and Shortness Of Breath   Latex    Sulfonamide Derivatives      Review of Systems:  No  visual changes, nausea, vomiting, diarrhea, constipation, dizziness, abdominal pain, skin rash, fevers, chills, night sweats, weight loss, swollen lymph nodes, body aches, joint swelling, chest pain, shortness of breath, mood changes. POSITIVE muscle aches, headaches  Objective  Blood pressure 120/74, height 5\' 2"  (1.575 m).   General: No apparent distress alert and oriented x3 mood and affect normal, dressed appropriately.  HEENT: Pupils equal, extraocular movements intact  Respiratory: Patient's speak in full sentences and does not appear short  of breath  Cardiovascular: No lower extremity edema, non tender, no erythema  MSK:  Back low back does have some loss of lordosis noted.  Some tenderness to palpation in the paraspinal musculature.  Neck exam does have some limited sidebending.  Osteopathic findings  C3 flexed rotated and side bent right C6 flexed rotated and side bent right T3 extended rotated and side bent right inhaled rib T9 extended rotated and side bent left L2 flexed rotated and side bent right Sacrum right on right     Assessment and Plan:  Low back pain Chronic, with exacerbation.  Patient has been a little more active.  Is trying to change her work as well.  Discussed this regimen of home exercises.  Increase activity slowly.  Follow-up again in 6 to 8 weeks otherwise.    Nonallopathic problems  Decision today to treat with OMT was based on Physical Exam  After verbal consent patient was treated with HVLA, ME, FPR techniques in cervical, rib, thoracic, lumbar, and sacral  areas  Patient tolerated the procedure well with improvement in symptoms  Patient given exercises, stretches and lifestyle modifications  See medications in patient instructions if given  Patient will follow up in 4-8 weeks     The above documentation has been reviewed and is accurate and complete Judi Saa, DO         Note: This dictation was prepared with Dragon dictation along with smaller phrase technology. Any transcriptional errors that result from this process are unintentional.

## 2023-07-31 ENCOUNTER — Ambulatory Visit: Payer: Commercial Managed Care - PPO | Admitting: Family Medicine

## 2023-07-31 ENCOUNTER — Encounter: Payer: Self-pay | Admitting: Family Medicine

## 2023-07-31 VITALS — BP 120/74 | Ht 62.0 in

## 2023-07-31 DIAGNOSIS — G43701 Chronic migraine without aura, not intractable, with status migrainosus: Secondary | ICD-10-CM | POA: Diagnosis not present

## 2023-07-31 DIAGNOSIS — M9904 Segmental and somatic dysfunction of sacral region: Secondary | ICD-10-CM

## 2023-07-31 DIAGNOSIS — M9901 Segmental and somatic dysfunction of cervical region: Secondary | ICD-10-CM | POA: Diagnosis not present

## 2023-07-31 DIAGNOSIS — M9908 Segmental and somatic dysfunction of rib cage: Secondary | ICD-10-CM | POA: Diagnosis not present

## 2023-07-31 DIAGNOSIS — M545 Low back pain, unspecified: Secondary | ICD-10-CM

## 2023-07-31 DIAGNOSIS — M9903 Segmental and somatic dysfunction of lumbar region: Secondary | ICD-10-CM | POA: Diagnosis not present

## 2023-07-31 DIAGNOSIS — M9902 Segmental and somatic dysfunction of thoracic region: Secondary | ICD-10-CM

## 2023-07-31 NOTE — Assessment & Plan Note (Signed)
 Chronic, with exacerbation.  Patient has been a little more active.  Is trying to change her work as well.  Discussed this regimen of home exercises.  Increase activity slowly.  Follow-up again in 6 to 8 weeks otherwise.

## 2023-07-31 NOTE — Patient Instructions (Signed)
 Happy Early [place] Ouch See me in 6-8 weeks

## 2023-07-31 NOTE — Assessment & Plan Note (Signed)
 Has had some migraines but is doing the Effexor at the moment.  Hopefully that there it has been a while since she has had one in quite some time that has been severe but likely secondary to more of the weather.  Discussed icing regimen and home exercises, which activities to do and which ones to avoid.  Increase activity slowly.  Follow-up again in 6 to 8 weeks

## 2023-10-08 NOTE — Progress Notes (Signed)
 [facility] [address] [place] [address] Phone: [phone] Subjective:   Melanie [name], am serving as a scribe for Dr. Ronnell Coins.  I'm seeing this patient by the request  of:  Colene Dauphin, MD  CC: Neck pain, headache, back pain follow-up  [id number]  Melanie [name] is a 63 y.o. female coming in with complaint of back and neck pain. OMT 07/31/2023. Patient states that her neck is doing ok. Pulled lower back L side last week. Pain is slowly improving. Painful with lumbar flexion. Denies any radiating symptoms.   Medications patient has been prescribed: None  Taking:         Reviewed prior external information including notes and imaging from previsou exam, outside providers and external EMR if available.   As well as notes that were available from care everywhere and other healthcare systems.  Past medical history, social, surgical and family history all reviewed in electronic medical record.  No pertanent information unless stated regarding to the chief complaint.   Past Medical History:  Diagnosis Date   Allergy    Asthma    Osteoporosis     Allergies  Allergen Reactions   Flaxseed Oil [Flaxseed (Linseed)] Anaphylaxis   Kiwi Extract Hives, Shortness Of Breath and Itching   Peanut Oil Anaphylaxis, Itching, Palpitations and Shortness Of Breath   Penicillins Hives and Shortness Of Breath   Latex    Sulfonamide Derivatives      Review of Systems:  No  visual changes, nausea, vomiting, diarrhea, constipation, dizziness, abdominal pain, skin rash, fevers, chills, night sweats, weight loss, swollen lymph nodes, joint swelling, chest pain, shortness of breath, mood changes. POSITIVE muscle aches, headaches, body aches  Objective  Blood pressure 122/86, pulse 85, height 5\' 2"  (1.575 m), weight 177 lb (80.3 kg), SpO2 100%.   General: No apparent distress alert and oriented x3 mood and affect normal, dressed  appropriately.  HEENT: Pupils equal, extraocular movements intact  Respiratory: Patient's speak in full sentences and does not appear short of breath  Cardiovascular: No lower extremity edema, non tender, no erythema  Gait relatively normal MSK:  Back does have some loss lordosis noted.  Neck exam does have some decrease in certain range of motion.  Severe tightness noted in more of the quadratus lumborum.  Osteopathic findings  C3 flexed rotated and side bent right C7 flexed rotated and side bent left T3 extended rotated and side bent right inhaled rib T9 extended rotated and side bent left L2 flexed rotated and side bent right L3 flexed rotated and side bent left Sacrum right on right     Assessment and Plan:  Low back pain Acute exacerbation of the low back pain.  Seems to be more secondary to musculature in nature.  Toradol  and Depo-Medrol  given today and has responded well to this previously for some of the headaches.  Discussed with patient to continue lifting but to monitor the lifting mechanics.  Can do other injections if necessary or further imaging but likely will do well.  Patient knows if any radicular symptoms, weakness to seek medical attention immediately.  Follow-up again in 6 to 8 weeks otherwise.    Nonallopathic problems  Decision today to treat with OMT was based on Physical Exam  After verbal consent patient was treated with HVLA, ME, FPR techniques in cervical, rib, thoracic, lumbar, and sacral  areas  Patient tolerated the procedure well with improvement in symptoms  Patient given exercises,  stretches and lifestyle modifications  See medications in patient instructions if given  Patient will follow up in 4-8 weeks     The above documentation has been reviewed and is accurate and complete Benjamyn Hestand M Arriyah Madej, DO         Note: This dictation was prepared with Dragon dictation along with smaller phrase technology. Any transcriptional errors that  result from this process are unintentional.

## 2023-10-09 ENCOUNTER — Ambulatory Visit: Admitting: Family Medicine

## 2023-10-09 ENCOUNTER — Encounter: Payer: Self-pay | Admitting: Family Medicine

## 2023-10-09 VITALS — BP 122/86 | HR 85 | Ht 62.0 in | Wt 177.0 lb

## 2023-10-09 DIAGNOSIS — M9903 Segmental and somatic dysfunction of lumbar region: Secondary | ICD-10-CM | POA: Diagnosis not present

## 2023-10-09 DIAGNOSIS — M545 Low back pain, unspecified: Secondary | ICD-10-CM

## 2023-10-09 DIAGNOSIS — M9901 Segmental and somatic dysfunction of cervical region: Secondary | ICD-10-CM | POA: Diagnosis not present

## 2023-10-09 DIAGNOSIS — M9904 Segmental and somatic dysfunction of sacral region: Secondary | ICD-10-CM

## 2023-10-09 DIAGNOSIS — M9902 Segmental and somatic dysfunction of thoracic region: Secondary | ICD-10-CM

## 2023-10-09 DIAGNOSIS — M9908 Segmental and somatic dysfunction of rib cage: Secondary | ICD-10-CM

## 2023-10-09 MED ORDER — KETOROLAC TROMETHAMINE 60 MG/2ML IM SOLN
60.0000 mg | Freq: Once | INTRAMUSCULAR | Status: AC
  Administered 2023-10-09: 60 mg via INTRAMUSCULAR

## 2023-10-09 MED ORDER — METHYLPREDNISOLONE ACETATE 80 MG/ML IJ SUSP
80.0000 mg | Freq: Once | INTRAMUSCULAR | Status: AC
  Administered 2023-10-09: 80 mg via INTRAMUSCULAR

## 2023-10-09 NOTE — Assessment & Plan Note (Signed)
 Acute exacerbation of the low back pain.  Seems to be more secondary to musculature in nature.  Toradol  and Depo-Medrol  given today and has responded well to this previously for some of the headaches.  Discussed with patient to continue lifting but to monitor the lifting mechanics.  Can do other injections if necessary or further imaging but likely will do well.  Patient knows if any radicular symptoms, weakness to seek medical attention immediately.  Follow-up again in 6 to 8 weeks otherwise.

## 2023-10-09 NOTE — Patient Instructions (Signed)
 Injections in backside today See me in 6 weeks

## 2023-11-19 NOTE — Progress Notes (Signed)
  [facility] [address] [place] [address] Phone: [phone] Subjective:   IBryan Caprio, am serving as a scribe for Dr. Ronnell Coins.  I'm seeing this patient by the request  of:  Colene Dauphin, MD  CC: back and neck pain follow up   [id number]  Sultana D Radebaugh is a 63 y.o. female coming in with complaint of back and neck pain. OMT on 10/09/2023. Patient states same per usual. No new symptoms. Just having a little weirdness in her shoulder.  Medications patient has been prescribed:   Taking:         Reviewed prior external information including notes and imaging from previsou exam, outside providers and external EMR if available.   As well as notes that were available from care everywhere and other healthcare systems.  Past medical history, social, surgical and family history all reviewed in electronic medical record.  No pertanent information unless stated regarding to the chief complaint.   Past Medical History:  Diagnosis Date   Allergy    Asthma    Osteoporosis     Allergies  Allergen Reactions   Flaxseed Oil [Flaxseed (Linseed)] Anaphylaxis   Kiwi Extract Hives, Shortness Of Breath and Itching   Peanut Oil Anaphylaxis, Itching, Palpitations and Shortness Of Breath   Penicillins Hives and Shortness Of Breath   Latex    Sulfonamide Derivatives      Review of Systems:  No headache, visual changes, nausea, vomiting, diarrhea, constipation, dizziness, abdominal pain, skin rash, fevers, chills, night sweats, weight loss, swollen lymph nodes, body aches, joint swelling, chest pain, shortness of breath, mood changes. POSITIVE muscle aches  Objective  Blood pressure 126/84, pulse 72, height 5' 2 (1.575 m), weight 178 lb (80.7 kg), SpO2 98%.   General: No apparent distress alert and oriented x3 mood and affect normal, dressed appropriately.  HEENT: Pupils equal, extraocular movements intact  Respiratory: Patient's speak  in full sentences and does not appear short of breath  Cardiovascular: No lower extremity edema, non tender, no erythema  Gait MSK:  Back does have some loss of lordosis.  A lot of tightness noted in the parascapular area.  Normal right side with sidebending.  Osteopathic findings  C2 flexed rotated and side bent right C6 flexed rotated and side bent right T3 extended rotated and side bent right inhaled rib T9 extended rotated and side bent left L2 flexed rotated and side bent right Sacrum right on right     Assessment and Plan:  Neck pain Causing some cervicogenic headaches, likely is secondary to which activities to do and which ones to avoid.  Do feel that it is more repetitive activity that could be potentially contributing.    Nonallopathic problems  Decision today to treat with OMT was based on Physical Exam  After verbal consent patient was treated with HVLA, ME, FPR techniques in cervical, rib, thoracic, lumbar, and sacral  areas  Patient tolerated the procedure well with improvement in symptoms  Patient given exercises, stretches and lifestyle modifications  See medications in patient instructions if given  Patient will follow up in 4-8 weeks    The above documentation has been reviewed and is accurate and complete Chandel Zaun M Camari Quintanilla, DO          Note: This dictation was prepared with Dragon dictation along with smaller phrase technology. Any transcriptional errors that result from this process are unintentional.

## 2023-11-20 ENCOUNTER — Encounter: Payer: Self-pay | Admitting: Family Medicine

## 2023-11-20 ENCOUNTER — Ambulatory Visit: Admitting: Family Medicine

## 2023-11-20 VITALS — BP 126/84 | HR 72 | Ht 62.0 in | Wt 178.0 lb

## 2023-11-20 DIAGNOSIS — M542 Cervicalgia: Secondary | ICD-10-CM

## 2023-11-20 DIAGNOSIS — M9904 Segmental and somatic dysfunction of sacral region: Secondary | ICD-10-CM | POA: Diagnosis not present

## 2023-11-20 DIAGNOSIS — M9902 Segmental and somatic dysfunction of thoracic region: Secondary | ICD-10-CM

## 2023-11-20 DIAGNOSIS — M9903 Segmental and somatic dysfunction of lumbar region: Secondary | ICD-10-CM

## 2023-11-20 DIAGNOSIS — M9901 Segmental and somatic dysfunction of cervical region: Secondary | ICD-10-CM | POA: Diagnosis not present

## 2023-11-20 DIAGNOSIS — M9908 Segmental and somatic dysfunction of rib cage: Secondary | ICD-10-CM | POA: Diagnosis not present

## 2023-11-20 NOTE — Patient Instructions (Signed)
 Good to see you Keep playing in the dirt Overall good Remember computer position See me in 2 months

## 2023-11-20 NOTE — Assessment & Plan Note (Signed)
 Causing some cervicogenic headaches, likely is secondary to which activities to do and which ones to avoid.  Do feel that it is more repetitive activity that could be potentially contributing.

## 2024-01-13 NOTE — Progress Notes (Unsigned)
 [name] [address] [place] [address] Phone: [phone] Subjective:   ISusannah [name], am serving as a scribe for Dr. Arthea Claudene.  I'm seeing this patient by the request  of:  [name] [name], MD  CC: Back and neck pain follow up   [name]  Melanie [name] is a 63 y.o. female coming in with complaint of back and neck pain. OMT 11/20/2023. Patient states doing well. No new symptoms.  Medications patient has been prescribed: None  Taking:         Reviewed prior external information including notes and imaging from previsou exam, outside providers and external EMR if available.   As well as notes that were available from care everywhere and other healthcare systems.  Past medical history, social, surgical and family history all reviewed in electronic medical record.  No pertanent information unless stated regarding to the chief complaint.   Past Medical History:  Diagnosis Date   Allergy    Asthma    Osteoporosis     Allergies  Allergen Reactions   Flaxseed Oil [Flaxseed (Linseed)] Anaphylaxis   Kiwi Extract Hives, Shortness Of Breath and Itching   Peanut Oil Anaphylaxis, Itching, Palpitations and Shortness Of Breath   Penicillins Hives and Shortness Of Breath   Latex    Sulfonamide Derivatives      Review of Systems:  No , visual changes, nausea, vomiting, diarrhea, constipation, dizziness, abdominal pain, skin rash, fevers, chills, night sweats, weight loss, swollen lymph nodes, body aches, joint swelling, chest pain, shortness of breath, mood changes. POSITIVE muscle aches, headaches  Objective  Blood pressure 122/82, pulse 83, height 5' 2 (1.575 m), weight 179 lb (81.2 kg), SpO2 97%.   General: No apparent distress alert and oriented x3 mood and affect normal, dressed appropriately.  HEENT: Pupils equal, extraocular movements intact  Respiratory: Patient's speak in full sentences and does not appear short of  breath  Cardiovascular: No lower extremity edema, non tender, no erythema  Gait normal MSK:  Back does have some loss lordosis noted.  Some tenderness to palpation of the paraspinal musculature.  Neck exam does have some difficulty with extension.  Osteopathic findings  C3 flexed rotated and side bent right C7 flexed rotated and side bent left T3 extended rotated and side bent right inhaled rib T9 extended rotated and side bent left L1 flexed rotated and side bent right L3 flexed rotated and side bent left Sacrum right on right       Assessment and Plan:  Low back pain More tightness in the lower back than usual but actually neck seems to be doing better.  Discussed with patient about icing regimen of home exercises, discussed which activities to do in which ones to avoid.  Increase activity slowly.  Discussed icing regimen.  Follow-up again in 6 to 8 weeks    Nonallopathic problems  Decision today to treat with OMT was based on Physical Exam  After verbal consent patient was treated with HVLA, ME, FPR techniques in cervical, rib, thoracic, lumbar, and sacral  areas  Patient tolerated the procedure well with improvement in symptoms  Patient given exercises, stretches and lifestyle modifications  See medications in patient instructions if given  Patient will follow up in 4-8 weeks    The above documentation has been reviewed and is accurate and complete Henderson Frampton M Tandi Hanko, DO          Note: This dictation was prepared with Dragon dictation along  with smaller phrase technology. Any transcriptional errors that result from this process are unintentional.

## 2024-01-15 ENCOUNTER — Encounter: Payer: Self-pay | Admitting: Family Medicine

## 2024-01-15 ENCOUNTER — Ambulatory Visit: Admitting: Family Medicine

## 2024-01-15 VITALS — BP 122/82 | HR 83 | Ht 62.0 in | Wt 179.0 lb

## 2024-01-15 DIAGNOSIS — M9902 Segmental and somatic dysfunction of thoracic region: Secondary | ICD-10-CM

## 2024-01-15 DIAGNOSIS — M545 Low back pain, unspecified: Secondary | ICD-10-CM

## 2024-01-15 DIAGNOSIS — M9904 Segmental and somatic dysfunction of sacral region: Secondary | ICD-10-CM

## 2024-01-15 DIAGNOSIS — M9908 Segmental and somatic dysfunction of rib cage: Secondary | ICD-10-CM | POA: Diagnosis not present

## 2024-01-15 DIAGNOSIS — M9903 Segmental and somatic dysfunction of lumbar region: Secondary | ICD-10-CM

## 2024-01-15 DIAGNOSIS — M9901 Segmental and somatic dysfunction of cervical region: Secondary | ICD-10-CM | POA: Diagnosis not present

## 2024-01-15 NOTE — Assessment & Plan Note (Signed)
 More tightness in the lower back than usual but actually neck seems to be doing better.  Discussed with patient about icing regimen of home exercises, discussed which activities to do in which ones to avoid.  Increase activity slowly.  Discussed icing regimen.  Follow-up again in 6 to 8 weeks

## 2024-01-15 NOTE — Patient Instructions (Signed)
 Good to see you! Keep pushing the weight in 5 lbs increment See you again in 7-8 weeks

## 2024-03-03 ENCOUNTER — Other Ambulatory Visit (HOSPITAL_COMMUNITY): Payer: Self-pay

## 2024-03-03 ENCOUNTER — Ambulatory Visit: Admitting: Sports Medicine

## 2024-03-03 ENCOUNTER — Ambulatory Visit: Admitting: Family Medicine

## 2024-03-03 VITALS — BP 122/68 | Ht 62.0 in | Wt 180.0 lb

## 2024-03-03 DIAGNOSIS — M9905 Segmental and somatic dysfunction of pelvic region: Secondary | ICD-10-CM | POA: Diagnosis not present

## 2024-03-03 DIAGNOSIS — M9904 Segmental and somatic dysfunction of sacral region: Secondary | ICD-10-CM

## 2024-03-03 DIAGNOSIS — M545 Low back pain, unspecified: Secondary | ICD-10-CM | POA: Diagnosis not present

## 2024-03-03 DIAGNOSIS — G8929 Other chronic pain: Secondary | ICD-10-CM | POA: Diagnosis not present

## 2024-03-03 DIAGNOSIS — S46812A Strain of other muscles, fascia and tendons at shoulder and upper arm level, left arm, initial encounter: Secondary | ICD-10-CM

## 2024-03-03 DIAGNOSIS — S46811A Strain of other muscles, fascia and tendons at shoulder and upper arm level, right arm, initial encounter: Secondary | ICD-10-CM

## 2024-03-03 DIAGNOSIS — M9903 Segmental and somatic dysfunction of lumbar region: Secondary | ICD-10-CM

## 2024-03-03 MED ORDER — FLUZONE 0.5 ML IM SUSY
0.5000 mL | PREFILLED_SYRINGE | INTRAMUSCULAR | 0 refills | Status: AC
  Filled 2024-03-03: qty 0.5, 1d supply, fill #0

## 2024-03-03 MED ORDER — COVID-19 MRNA VAC-TRIS(PFIZER) 30 MCG/0.3ML IM SUSY
0.3000 mL | PREFILLED_SYRINGE | INTRAMUSCULAR | 0 refills | Status: AC
  Filled 2024-03-03: qty 0.3, 1d supply, fill #0

## 2024-03-03 NOTE — Progress Notes (Signed)
 [name] [address] [place] [address] Phone: [phone]   Assessment and Plan:     1. Chronic bilateral low back pain without sciatica (Primary) 2. Somatic dysfunction of lumbar region 3. Somatic dysfunction of pelvic region 4. Somatic dysfunction of sacral region 5. Strain of left trapezius muscle, initial encounter 6. Strain of right trapezius muscle, initial encounter -Chronic with exacerbation, subsequent visit - Recurrence of multiple areas of musculoskeletal pain with prominent being bilateral trapezius, low back.  Overall improvement with OMT, HEP, as needed Tylenol  and tizanidine  - Use Tylenol  500 to 1000 mg tablets 2-3 times a day for day-to-day pain relief - Start HEP for trapezius - Continue tizanidine  2 to 4 mg daily as needed for muscle spasms - Patient has received relief with OMT in the past.  Elects for repeat OMT today.  Tolerated well per note below. - Decision today to treat with OMT was based on Physical Exam   After verbal consent patient was treated with HVLA (high velocity low amplitude), ME (muscle energy), FPR (flex positional release), ST (soft tissue), PC/PD (Pelvic Compression/ Pelvic Decompression) techniques in  , thoracic, lumbar, and pelvic areas. Patient tolerated the procedure well with improvement in symptoms.  Patient educated on potential side effects of soreness and recommended to rest, hydrate, and use Tylenol  as needed for pain control.   15 additional minutes spent for educating Therapeutic Home Exercise Program.  This included exercises focusing on stretching, strengthening, with focus on eccentric aspects.   Long term goals include an improvement in range of motion, strength, endurance as well as avoiding reinjury. Patient's frequency would include in 1-2 times a day, 3-5 times a week for a duration of 6-12 weeks. Proper technique shown and discussed handout in great detail with ATC.  All questions  were discussed and answered.    Pertinent previous records reviewed include none  Follow Up: 6 to 8 weeks for reevaluation with Dr. Claudene.  Could consider repeat OMT.  May follow-up with me sooner if needed   Subjective:   I, [name], am serving as a Neurosurgeon for Doctor Morene Mace  Chief Complaint: back and neck pain   HPI:   01/15/2024 Melanie [name] is a 63 y.o. female coming in with complaint of back and neck pain. OMT 11/20/2023. Patient states doing well. No new symptoms.   03/03/24 Patient states here for adjustment low back is flared    Relevant Historical Information: History of migraines  Additional pertinent review of systems negative.  Current Outpatient Medications  Medication Sig Dispense Refill   albuterol  (PROAIR  HFA) 108 (90 Base) MCG/ACT inhaler Inhale 1 puff into the lungs every 6 (six) hours as needed for wheezing or shortness of breath. 1 each 8   benzonatate  (TESSALON ) 100 MG capsule Take 1 capsule (100 mg total) by mouth 2 (two) times daily as needed for cough. 20 capsule 0   benzonatate  (TESSALON ) 200 MG capsule Take 1 capsule (200 mg total) by mouth 2 (two) times daily as needed for cough. 20 capsule 0   cholecalciferol (VITAMIN D ) 1000 units tablet Take 2,000 Units by mouth daily.     fluticasone  (FLONASE ) 50 MCG/ACT nasal spray Place 2 sprays into both nostrils daily. 16 g 6   fluticasone  (FLOVENT  HFA) 110 MCG/ACT inhaler Inhale 1 puff into the lungs 2 (two) times daily. 1 each 8   Multiple Vitamin (MULTIVITAMIN) tablet Take 1 tablet by mouth daily.  Probiotic Product (PROBIOTIC DAILY PO) Take by mouth.     rizatriptan  (MAXALT -MLT) 10 MG disintegrating tablet Take 1 tablet (10 mg total) by mouth as needed for migraine. May repeat in 2 hours if needed 10 tablet 3   tiZANidine  (ZANAFLEX ) 4 MG tablet TAKE 1 TABLET BY MOUTH AT BEDTIME. 90 tablet 1   venlafaxine  XR (EFFEXOR  XR) 37.5 MG 24 hr capsule Take 1 capsule (37.5 mg total) by mouth daily  with breakfast. 30 capsule 0   No current facility-administered medications for this visit.      Objective:     Vitals:   03/03/24 1342  BP: 122/68  Weight: 180 lb (81.6 kg)  Height: 5' 2 (1.575 m)      Body mass index is 32.92 kg/m.    Physical Exam:     General: Well-appearing, cooperative, sitting comfortably in no acute distress.   OMT Physical Exam:  ASIS Compression Test: Positive Right Thoracic: TTP trapezius and paraspinal, T3-5 RRSL, T6-8 RLSR Lumbar: TTP paraspinal, 1-3 RRSL Pelvis: Right anterior innominate  Electronically signed by:  Odis [name] 2:10 PM 03/03/24

## 2024-03-03 NOTE — Patient Instructions (Signed)
 Trap HEP   6-8 week follow up with Dr. Claudene

## 2024-04-21 ENCOUNTER — Ambulatory Visit: Admitting: Sports Medicine

## 2024-04-25 NOTE — Progress Notes (Unsigned)
 [name] [address] [place] [address] Phone: [phone] Subjective:   Melanie [name] am a scribe for Dr. Claudene.   I'[name] seeing this patient by the request  of:  [name] [name], MD  CC: Back and neck pain follow-up  YEP:[name]  Melanie [name] is a 63 y.o. female coming in with complaint of back and neck pain. OMT 03/03/2024.  Patient did have an exacerbation in September and saw another provider.  Patient states neck is ok but the back is jacked up.   Medications patient has been prescribed: None  Taking: none         Reviewed prior external information including notes and imaging from previsou exam, outside providers and external EMR if available.   As well as notes that were available from care everywhere and other healthcare systems.  Past medical history, social, surgical and family history all reviewed in electronic medical record.  No pertanent information unless stated regarding to the chief complaint.   Past Medical History:  Diagnosis Date   Allergy    Asthma    Osteoporosis     Allergies  Allergen Reactions   Flaxseed Oil [Flaxseed (Linseed)] Anaphylaxis   Kiwi Extract Hives, Shortness Of Breath and Itching   Peanut Oil Anaphylaxis, Itching, Palpitations and Shortness Of Breath   Penicillins Hives and Shortness Of Breath   Latex    Sulfonamide Derivatives      Review of Systems:  No headache, visual changes, nausea, vomiting, diarrhea, constipation, dizziness, abdominal pain, skin rash, fevers, chills, night sweats, weight loss, swollen lymph nodes, body aches, joint swelling, chest pain, shortness of breath, mood changes. POSITIVE muscle aches  Objective  Blood pressure 138/70, pulse 76, height 5' 2 (1.575 [name]), weight 181 lb (82.1 kg), SpO2 99%.   General: No apparent distress alert and oriented x3 mood and affect normal, dressed appropriately.  HEENT: Pupils equal, extraocular movements intact   Respiratory: Patient's speak in full sentences and does not appear short of breath  Cardiovascular: No lower extremity edema, non tender, no erythema  Gait MSK:  Back does have some loss of lordosis noted.  Some tenderness to palpation in the paraspinal musculature.  Some tightness noted with sidebending of the neck bilaterally.  Negative Spurling's noted.  Osteopathic findings  C2 flexed rotated and side bent right C3 flexed rotated and side bent right C6 flexed rotated and side bent left T3 extended rotated and side bent right inhaled rib T9 extended rotated and side bent left L2 flexed rotated and side bent right L3 flexed rotated and side bent left Sacrum right on right       Assessment and Plan:  Neck pain Patient does have neck pain that is multifactorial.  Discussed icing regimen and home exercises.  Increase activity slowly.  Discussed icing regimen.  Follow-up again in 6 to 12 weeks.  Discussed with patient about which activities to do and which ones to avoid.  Increase activity slowly.  Follow-up again in 6 to 12 weeks.    Nonallopathic problems  Decision today to treat with OMT was based on Physical Exam  After verbal consent patient was treated with HVLA, ME, FPR techniques in cervical, rib, thoracic, lumbar, and sacral  areas  Patient tolerated the procedure well with improvement in symptoms  Patient given exercises, stretches and lifestyle modifications  See medications in patient instructions if given  Patient will follow up in 4-8 weeks    The above documentation  has been reviewed and is accurate and complete Melanie [name] Melanie Ericksen, DO          Note: This dictation was prepared with Dragon dictation along with smaller phrase technology. Any transcriptional errors that result from this process are unintentional.

## 2024-04-28 ENCOUNTER — Encounter: Payer: Self-pay | Admitting: Family Medicine

## 2024-04-28 ENCOUNTER — Ambulatory Visit: Admitting: Family Medicine

## 2024-04-28 VITALS — BP 138/70 | HR 76 | Ht 62.0 in | Wt 181.0 lb

## 2024-04-28 DIAGNOSIS — M9904 Segmental and somatic dysfunction of sacral region: Secondary | ICD-10-CM

## 2024-04-28 DIAGNOSIS — M9903 Segmental and somatic dysfunction of lumbar region: Secondary | ICD-10-CM

## 2024-04-28 DIAGNOSIS — M9902 Segmental and somatic dysfunction of thoracic region: Secondary | ICD-10-CM | POA: Diagnosis not present

## 2024-04-28 DIAGNOSIS — M9908 Segmental and somatic dysfunction of rib cage: Secondary | ICD-10-CM

## 2024-04-28 DIAGNOSIS — M542 Cervicalgia: Secondary | ICD-10-CM | POA: Diagnosis not present

## 2024-04-28 DIAGNOSIS — M9901 Segmental and somatic dysfunction of cervical region: Secondary | ICD-10-CM | POA: Diagnosis not present

## 2024-04-28 NOTE — Patient Instructions (Addendum)
 Good to see you. Happy Holidays.  Tell me if you leave the country. See me again in 2 to 3 months.

## 2024-04-28 NOTE — Assessment & Plan Note (Signed)
 Patient does have neck pain that is multifactorial.  Discussed icing regimen and home exercises.  Increase activity slowly.  Discussed icing regimen.  Follow-up again in 6 to 12 weeks.  Discussed with patient about which activities to do and which ones to avoid.  Increase activity slowly.  Follow-up again in 6 to 12 weeks.
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
## 2024-07-01 ENCOUNTER — Ambulatory Visit: Admitting: Family Medicine

## 2024-07-01 ENCOUNTER — Encounter: Payer: Self-pay | Admitting: Family Medicine

## 2024-07-01 VITALS — BP 132/90 | Ht 62.0 in

## 2024-07-01 DIAGNOSIS — M9904 Segmental and somatic dysfunction of sacral region: Secondary | ICD-10-CM

## 2024-07-01 DIAGNOSIS — M9903 Segmental and somatic dysfunction of lumbar region: Secondary | ICD-10-CM | POA: Diagnosis not present

## 2024-07-01 DIAGNOSIS — M542 Cervicalgia: Secondary | ICD-10-CM

## 2024-07-01 DIAGNOSIS — M9908 Segmental and somatic dysfunction of rib cage: Secondary | ICD-10-CM | POA: Diagnosis not present

## 2024-07-01 DIAGNOSIS — G4486 Cervicogenic headache: Secondary | ICD-10-CM | POA: Diagnosis not present

## 2024-07-01 DIAGNOSIS — M9902 Segmental and somatic dysfunction of thoracic region: Secondary | ICD-10-CM

## 2024-07-01 DIAGNOSIS — M9901 Segmental and somatic dysfunction of cervical region: Secondary | ICD-10-CM | POA: Diagnosis not present
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
## 2024-07-01 NOTE — Assessment & Plan Note (Signed)
 A little different than patient's migraines.  Discussed with patient about which activities to do and which ones to avoid.  Increase activity slowly.  Increase activity slowly because patient may have more migraines.  We discussed avoiding certain activities.  Follow-up again in 6 to 12 weeks otherwise.

## 2024-07-01 NOTE — Patient Instructions (Signed)
Great to see you °See me in 6 weeks °

## 2024-08-12 ENCOUNTER — Ambulatory Visit: Admitting: Family Medicine
